# Patient Record
Sex: Female | Born: 1965 | Race: Black or African American | Hispanic: No | Marital: Married | Smoking: Former smoker
Health system: Southern US, Community
[De-identification: ages and names within clinical notes are randomized; demographics above are authoritative.]

## PROBLEM LIST (undated history)

## (undated) DIAGNOSIS — Z923 Personal history of irradiation: Secondary | ICD-10-CM

## (undated) DIAGNOSIS — C50919 Malignant neoplasm of unspecified site of unspecified female breast: Secondary | ICD-10-CM

## (undated) DIAGNOSIS — Z973 Presence of spectacles and contact lenses: Secondary | ICD-10-CM

## (undated) DIAGNOSIS — M79671 Pain in right foot: Secondary | ICD-10-CM

## (undated) DIAGNOSIS — K649 Unspecified hemorrhoids: Secondary | ICD-10-CM

## (undated) DIAGNOSIS — Z79810 Long term (current) use of selective estrogen receptor modulators (SERMs): Secondary | ICD-10-CM

## (undated) HISTORY — DX: Long term (current) use of selective estrogen receptor modulators (serms): Z79.810

## (undated) HISTORY — PX: OTHER SURGICAL HISTORY: SHX169

## (undated) HISTORY — DX: Unspecified hemorrhoids: K64.9

## (undated) HISTORY — PX: DILATION AND CURETTAGE OF UTERUS: SHX78

## (undated) HISTORY — PX: BREAST LUMPECTOMY: SHX2

---

## 1999-12-20 ENCOUNTER — Emergency Department (HOSPITAL_COMMUNITY): Admission: EM | Admit: 1999-12-20 | Discharge: 1999-12-20 | Payer: Self-pay | Admitting: Emergency Medicine

## 2000-06-20 ENCOUNTER — Other Ambulatory Visit: Admission: RE | Admit: 2000-06-20 | Discharge: 2000-06-20 | Payer: Self-pay | Admitting: *Deleted

## 2004-02-14 ENCOUNTER — Inpatient Hospital Stay (HOSPITAL_COMMUNITY): Admission: AD | Admit: 2004-02-14 | Discharge: 2004-02-14 | Payer: Self-pay | Admitting: Obstetrics

## 2004-03-26 ENCOUNTER — Ambulatory Visit (HOSPITAL_COMMUNITY): Admission: RE | Admit: 2004-03-26 | Discharge: 2004-03-26 | Payer: Self-pay | Admitting: Obstetrics & Gynecology

## 2004-03-29 ENCOUNTER — Ambulatory Visit (HOSPITAL_COMMUNITY): Admission: RE | Admit: 2004-03-29 | Discharge: 2004-03-29 | Payer: Self-pay | Admitting: Obstetrics

## 2004-03-29 ENCOUNTER — Encounter (INDEPENDENT_AMBULATORY_CARE_PROVIDER_SITE_OTHER): Payer: Self-pay | Admitting: Specialist

## 2006-01-06 ENCOUNTER — Encounter (INDEPENDENT_AMBULATORY_CARE_PROVIDER_SITE_OTHER): Payer: Self-pay | Admitting: Specialist

## 2006-01-06 ENCOUNTER — Inpatient Hospital Stay (HOSPITAL_COMMUNITY): Admission: AD | Admit: 2006-01-06 | Discharge: 2006-01-08 | Payer: Self-pay | Admitting: Obstetrics

## 2006-01-12 ENCOUNTER — Inpatient Hospital Stay (HOSPITAL_COMMUNITY): Admission: AD | Admit: 2006-01-12 | Discharge: 2006-01-14 | Payer: Self-pay | Admitting: Obstetrics

## 2006-07-26 ENCOUNTER — Encounter: Admission: RE | Admit: 2006-07-26 | Discharge: 2006-07-26 | Payer: Self-pay | Admitting: Family Medicine

## 2006-08-14 ENCOUNTER — Ambulatory Visit (HOSPITAL_COMMUNITY): Admission: RE | Admit: 2006-08-14 | Discharge: 2006-08-14 | Payer: Self-pay | Admitting: Obstetrics

## 2007-08-22 ENCOUNTER — Ambulatory Visit (HOSPITAL_COMMUNITY): Admission: RE | Admit: 2007-08-22 | Discharge: 2007-08-22 | Payer: Self-pay | Admitting: Obstetrics

## 2008-01-13 HISTORY — PX: FOOT FUSION: SHX956

## 2008-01-31 ENCOUNTER — Encounter: Admission: RE | Admit: 2008-01-31 | Discharge: 2008-01-31 | Payer: Self-pay | Admitting: Orthopedic Surgery

## 2008-02-14 ENCOUNTER — Ambulatory Visit (HOSPITAL_BASED_OUTPATIENT_CLINIC_OR_DEPARTMENT_OTHER): Admission: RE | Admit: 2008-02-14 | Discharge: 2008-02-14 | Payer: Self-pay | Admitting: Orthopedic Surgery

## 2008-08-26 ENCOUNTER — Ambulatory Visit (HOSPITAL_COMMUNITY): Admission: RE | Admit: 2008-08-26 | Discharge: 2008-08-26 | Payer: Self-pay | Admitting: Obstetrics

## 2008-11-12 ENCOUNTER — Encounter: Admission: RE | Admit: 2008-11-12 | Discharge: 2008-11-12 | Payer: Self-pay | Admitting: Family Medicine

## 2009-08-31 ENCOUNTER — Ambulatory Visit (HOSPITAL_COMMUNITY): Admission: RE | Admit: 2009-08-31 | Discharge: 2009-08-31 | Payer: Self-pay | Admitting: Obstetrics

## 2010-09-02 ENCOUNTER — Ambulatory Visit (HOSPITAL_COMMUNITY)
Admission: RE | Admit: 2010-09-02 | Discharge: 2010-09-02 | Payer: Self-pay | Source: Home / Self Care | Admitting: Obstetrics

## 2011-03-29 NOTE — Op Note (Signed)
NAMEMEYLI, BOICE               ACCOUNT NO.:  [id number]   MEDICAL RECORD NO.:  [id number]          PATIENT TYPE:  AMB   LOCATION:  [facility]                         FACILITY:  [facility]   PHYSICIAN:  Deidre Ala, M.D.    DATE OF BIRTH:  1966/07/24   DATE OF PROCEDURE:  02/14/2008  DATE OF DISCHARGE:                               OPERATIVE REPORT   PREOPERATIVE DIAGNOSIS:  Nonunion right medial malleolar fracture,  nondisplaced, closed at 12 weeks.   POSTOPERATIVE DIAGNOSIS:  Nonunion right medial malleolar fracture,  nondisplaced, closed at 12 weeks.   PROCEDURE:  Open reduction and internal fixation of nonunion medial  malleolar fracture with two 4.5 cannulated cancellous screws,  percutaneous.   SURGEON:  1. Charlesetta Shanks, M.D.   ASSISTANT:  Phineas Semen, P.A.-C.   ANESTHESIA:  General with LMA.   CULTURES:  None.   DRAINS:  None.   ESTIMATED BLOOD LOSS:  Minimal.   TOURNIQUET TIME:  Without.   PATHOLOGIC FINDINGS AND HISTORY:  Jaylon is a 45 year old who slipped on  some iced marbles steps and landed on the concrete injuring her ankle.  She was sent by Dr. Angelita Ingles. Blunt on November 23, 2007. Her injury was  November 18, 2007.  X-rays revealed a medial malleolar fracture classic  position with intact mortise nondisplaced.  Because of its position, we  felt it may well heal without surgical intervention and it was  nondisplaced so we put her in a Cam Walker boot and watched her very  closely.  She appeared to be healing and at about 8 weeks out, we took  her out of the boot into a lace up brace.  She came back in with some  pain.  X-rays were taken showing increase lucency at the fracture site  so a CT scan was ordered which showed, with 3D reconstruct, there was  some T-shaped comminution but primarily it did show capping of the  medullary cavity indicative of early nonunion.  We, therefore, elected  to take her to the operating room.  At surgery, we placed two guide pins  for  4.5 cannulated cancellous screws.  They were slightly divergent at  the medial malleolus and up.  C-arm fluoroscopy confirmed positioning on  both views.  We placed them parallel on the lateral and slight diversion  on the AP mortise view.  In effect, the drilling across the fracture  site with the guide pins will stimulate healing and this will buttress  and stabilize the fracture.   DESCRIPTION OF PROCEDURE:  With adequate anesthesia obtained using LMA  technique, 1 gram Ancef given as IV prophylaxis, the patient was placed  in the supine position.  The right lower extremity was prepped from the  toes to the upper leg in a standard fashion.  After standard prepping  and draping, we first placed the guide pins and then drilled with the  drill and then placed, after measurement, two 70 mm screws and tightened  them down.  C-arm fluoroscopy confirmed positioning.  The patient then,  having tolerated the procedure well, had these wounds closed with  4-0  nylon.  A bulky sterile compressive dressing was applied.  The patient  was placed back in her Cam Conservation officer, nature.  She was then awakened and taken  to the recovery room in satisfactory condition to be discharged per  outpatient routine, given Vicodin for pain, elevation, and told to call  the office for recheck on Saturday.           ______________________________  V. Charlesetta Shanks, M.D.     VEP/MEDQ  D:  02/14/2008  T:  02/14/2008  Job:  [id number]

## 2011-04-01 NOTE — Discharge Summary (Signed)
[name], Sue [name]                ACCOUNT NO.:  [id number]   MEDICAL RECORD NO.:  [id number]          PATIENT TYPE:  INP   LOCATION:  9308                          FACILITY:  [facility]   PHYSICIAN:  [name], M.D.DATE OF BIRTH:  1965-12-28   DATE OF ADMISSION:  01/12/2006  DATE OF DISCHARGE:  01/14/2006                                 DISCHARGE SUMMARY   The patient is a 45 year old gravida 2, para 1-0-1-1, who had a normal  vaginal delivery on January 06, 2006 and she was admitted with a count of  103.  No nausea or vomiting, no diarrhea and normal lochia.  On admission,  it was noted that her uterus was rather tender and 16 weeks' size.  She was  started on ampicillin and gentamicin.  On day after admission, temp was 101+  and by March 3, her highest temp was 100.3.  She felt much better.  Uterine  tenderness improved and she was discharged home on Cleocin 300 mg p.o. q.6h.   DISCHARGE DIAGNOSIS:  Status post postpartum endometritis.           ______________________________  [name], M.D.     BAM/MEDQ  D:  02/01/2006  T:  02/01/2006  Job:  [id number]

## 2011-04-01 NOTE — Op Note (Signed)
[name], Sue [name]                          ACCOUNT NO.:  [id number]   MEDICAL RECORD NO.:  [id number]                   PATIENT TYPE:  AMB   LOCATION:  [facility]                                  FACILITY:  [facility]   PHYSICIAN:  Charles A. Clearance Coots, M.D.             DATE OF BIRTH:  18-Oct-1966   DATE OF PROCEDURE:  03/29/2004  DATE OF DISCHARGE:                                 OPERATIVE REPORT   PREOPERATIVE DIAGNOSIS:  Eight week fetal demise by ultrasound.   POSTOPERATIVE DIAGNOSIS:  Eight week fetal demise by ultrasound.   PROCEDURE:  Suction, dilatation and evacuation.   SURGEON:  Charles A. Clearance Coots, M.D.   ANESTHESIA:  MAC with paracervical block.   ESTIMATED BLOOD LOSS:  100 ml.   COMPLICATIONS:  None.   SPECIMENS:  Products of conception.   OPERATION:  The patient was brought to the operating room and after  satisfactory IV sedation, the legs were brought up in stirrups and the  vagina was prepped and draped in the usual sterile fashion.  The urinary  bladder was emptied of approximately 100 ml of clear urine.  Bimanual  examination revealed the uterus to be approximately eight week size and mid  position.  The sterile speculum was inserted in the vaginal vault and the  anterior lip of the cervix was grasped with a single-toothed tenaculum.  The  paracervical block of approximately 20 ml of 2% Xylocaine with 1 ml of  sodium bicarbonate.  The mixture was injected in each lateral fornix,  approximately 10 ml in each lateral fornix.  The axis of the uterus was then  sounded.  The cervix was dilated to a #27 [place] dilator.  The #8  suction catheter was then easily introduced into the uterine cavity.  All  contents were evacuated.  The endometrial surface felt gritty at the  conclusion of the procedure all around and the uterus contracted down quite  well.  There was no active bleeding at the conclusion of the procedure.  All  instruments were retired.  The patient tolerated  the procedure well and  transported to the recovery room in satisfactory condition.                                               Charles A. Clearance Coots, M.D.    CAH/MEDQ  D:  03/29/2004  T:  03/29/2004  Job:  [id number]

## 2011-07-25 ENCOUNTER — Other Ambulatory Visit (HOSPITAL_COMMUNITY): Payer: Self-pay | Admitting: Obstetrics

## 2011-07-25 DIAGNOSIS — Z1231 Encounter for screening mammogram for malignant neoplasm of breast: Secondary | ICD-10-CM

## 2011-08-09 LAB — POCT PREGNANCY, URINE
Operator id: 114531
Preg Test, Ur: NEGATIVE

## 2011-08-09 LAB — POCT HEMOGLOBIN-HEMACUE
Hemoglobin: 14.4
Operator id: 114531

## 2011-09-06 ENCOUNTER — Ambulatory Visit (HOSPITAL_COMMUNITY): Payer: Medicaid Other | Attending: Obstetrics

## 2011-11-24 ENCOUNTER — Ambulatory Visit (HOSPITAL_COMMUNITY)
Admission: RE | Admit: 2011-11-24 | Discharge: 2011-11-24 | Disposition: A | Discharge status: Home / Self Care | Payer: Medicaid Other | Source: Ambulatory Visit | Attending: Obstetrics | Admitting: Obstetrics

## 2011-11-24 DIAGNOSIS — Z1231 Encounter for screening mammogram for malignant neoplasm of breast: Secondary | ICD-10-CM

## 2011-12-01 ENCOUNTER — Other Ambulatory Visit: Payer: Self-pay | Admitting: Obstetrics

## 2011-12-01 DIAGNOSIS — R928 Other abnormal and inconclusive findings on diagnostic imaging of breast: Secondary | ICD-10-CM

## 2011-12-26 ENCOUNTER — Ambulatory Visit
Admission: RE | Admit: 2011-12-26 | Discharge: 2011-12-26 | Disposition: A | Discharge status: Home / Self Care | Payer: Medicaid Other | Source: Ambulatory Visit | Attending: Obstetrics | Admitting: Obstetrics

## 2011-12-26 DIAGNOSIS — R928 Other abnormal and inconclusive findings on diagnostic imaging of breast: Secondary | ICD-10-CM

## 2012-05-23 ENCOUNTER — Other Ambulatory Visit: Payer: Self-pay | Admitting: Obstetrics

## 2012-05-23 DIAGNOSIS — R921 Mammographic calcification found on diagnostic imaging of breast: Secondary | ICD-10-CM

## 2012-06-25 ENCOUNTER — Ambulatory Visit
Admission: RE | Admit: 2012-06-25 | Discharge: 2012-06-25 | Disposition: A | Discharge status: Home / Self Care | Payer: Medicaid Other | Source: Ambulatory Visit | Attending: Obstetrics | Admitting: Obstetrics

## 2012-06-25 ENCOUNTER — Other Ambulatory Visit: Payer: Self-pay | Admitting: Obstetrics

## 2012-06-25 DIAGNOSIS — R921 Mammographic calcification found on diagnostic imaging of breast: Secondary | ICD-10-CM

## 2012-07-19 ENCOUNTER — Other Ambulatory Visit: Payer: Self-pay | Admitting: Obstetrics

## 2012-07-19 ENCOUNTER — Ambulatory Visit
Admission: RE | Admit: 2012-07-19 | Discharge: 2012-07-19 | Disposition: A | Discharge status: Home / Self Care | Payer: Medicaid Other | Source: Ambulatory Visit | Attending: Obstetrics | Admitting: Obstetrics

## 2012-07-19 DIAGNOSIS — R921 Mammographic calcification found on diagnostic imaging of breast: Secondary | ICD-10-CM

## 2012-07-26 ENCOUNTER — Ambulatory Visit
Admission: RE | Admit: 2012-07-26 | Discharge: 2012-07-26 | Disposition: A | Discharge status: Home / Self Care | Payer: Medicaid Other | Source: Ambulatory Visit | Attending: Obstetrics | Admitting: Obstetrics

## 2012-07-26 DIAGNOSIS — R921 Mammographic calcification found on diagnostic imaging of breast: Secondary | ICD-10-CM

## 2012-07-26 HISTORY — PX: OTHER SURGICAL HISTORY: SHX169

## 2012-07-27 ENCOUNTER — Other Ambulatory Visit: Payer: Self-pay | Admitting: Obstetrics

## 2012-07-27 DIAGNOSIS — N6489 Other specified disorders of breast: Secondary | ICD-10-CM

## 2012-07-27 DIAGNOSIS — C50912 Malignant neoplasm of unspecified site of left female breast: Secondary | ICD-10-CM

## 2012-07-31 ENCOUNTER — Ambulatory Visit
Admission: RE | Admit: 2012-07-31 | Discharge: 2012-07-31 | Disposition: A | Discharge status: Home / Self Care | Payer: Medicaid Other | Source: Ambulatory Visit | Attending: Obstetrics | Admitting: Obstetrics

## 2012-07-31 ENCOUNTER — Ambulatory Visit
Admission: RE | Admit: 2012-07-31 | Discharge: 2012-07-31 | Disposition: A | Discharge status: Home / Self Care | Payer: Self-pay | Source: Ambulatory Visit | Attending: Obstetrics | Admitting: Obstetrics

## 2012-07-31 DIAGNOSIS — N6489 Other specified disorders of breast: Secondary | ICD-10-CM

## 2012-07-31 DIAGNOSIS — C50912 Malignant neoplasm of unspecified site of left female breast: Secondary | ICD-10-CM

## 2012-07-31 MED ORDER — GADOBENATE DIMEGLUMINE 529 MG/ML IV SOLN
15.0000 mL | Freq: Once | INTRAVENOUS | Status: AC | PRN
  Administered 2012-07-31: 15 mL via INTRAVENOUS

## 2012-08-02 ENCOUNTER — Ambulatory Visit (INDEPENDENT_AMBULATORY_CARE_PROVIDER_SITE_OTHER): Payer: Medicaid Other | Admitting: General Surgery

## 2012-08-02 ENCOUNTER — Encounter (INDEPENDENT_AMBULATORY_CARE_PROVIDER_SITE_OTHER): Payer: Self-pay | Admitting: General Surgery

## 2012-08-02 ENCOUNTER — Other Ambulatory Visit (INDEPENDENT_AMBULATORY_CARE_PROVIDER_SITE_OTHER): Payer: Self-pay | Admitting: General Surgery

## 2012-08-02 VITALS — BP 104/62 | HR 88 | Temp 97.6°F | Resp 20 | Ht 69.0 in | Wt 154.2 lb

## 2012-08-02 DIAGNOSIS — C50419 Malignant neoplasm of upper-outer quadrant of unspecified female breast: Secondary | ICD-10-CM

## 2012-08-02 NOTE — Progress Notes (Signed)
Patient ID: Sue [name], female   DOB: 30-Sep-1966, 46 y.o.   MRN: [id number]  Chief Complaint  Patient presents with  . Pre-op Exam    eval lt br ca    HPI Sue [name] is a 46 y.o. female.  She is referred by Dr. Britta Mccreedy at the breast center of [facility] for a direct and management of noninvasive cancer of the left breast, upper outer quadrant.  The patient has not had her breast problem in the past. She gets routine breast exams and routine mammograms through her primary care physician, Dr. Francoise Ceo. Recent mammograms showed a 1.5 cm area of microcalcifications in the upper outer quadrant of the left breast. The right breast looks normal on mammogram. Biopsy of the left breast calcification should ductal carcinoma in situ, ER 100%, PR 0%. MRI shows biopsy changes but no other abnormalities and no adenopathy.  Family history reveals that her mother was diagnosed with breast cancer in her 51s. She has on her father's side, 2 cousins who are twins both of who were diagnosed with breast cancer and had been treated she does not know about any genetic testing that was done.  She is otherwise healthy. She does not smoke but does drink alcohol occasionally. 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 She's had 3 pregnancies, one delivery and  has lost 2 pregnancies. HPI  History reviewed. No pertinent past medical history.  Past Surgical History  Procedure Date  . Foot fusion     Family History  Problem Relation Age of Onset  . Cancer Mother     breast&blood  . Cancer Paternal Aunt     brain    Social History History  Substance Use Topics  . Smoking status: Never Smoker   . Smokeless tobacco: Never Used  . Alcohol Use: Yes     very seldom    No Known Allergies  No current outpatient prescriptions on file.    Review of Systems Review of Systems  Constitutional: Negative for fever, chills and unexpected weight change.    HENT: Negative for hearing loss, congestion, sore throat, trouble swallowing and voice change.   Eyes: Negative for visual disturbance.  Respiratory: Negative for cough and wheezing.   Cardiovascular: Negative for chest pain, palpitations and leg swelling.  Gastrointestinal: Negative for nausea, vomiting, abdominal pain, diarrhea, constipation, blood in stool, abdominal distention and anal bleeding.  Genitourinary: Negative for hematuria, vaginal bleeding and difficulty urinating.  Musculoskeletal: Negative for arthralgias.  Skin: Negative for rash and wound.  Neurological: Negative for seizures, syncope and headaches.  Hematological: Negative for adenopathy. Does not bruise/bleed easily.  Psychiatric/Behavioral: Negative for confusion.    Blood pressure 104/62, pulse 88, temperature 97.6 F (36.4 C), temperature source Temporal, resp. rate 20, height 5\' 9"  (1.753 m), weight 154 lb 3.2 oz (69.945 kg), last menstrual period 07/02/2012.  Physical Exam Physical Exam  Constitutional: She is oriented to person, place, and time. She appears well-developed and well-nourished. No distress.  HENT:  Head: Normocephalic and atraumatic.  Nose: Nose normal.  Mouth/Throat: No oropharyngeal exudate.  Eyes: Conjunctivae normal and EOM are normal. Pupils are equal, round, and reactive to light. Left eye exhibits no discharge. No scleral icterus.  Neck: Neck supple. No JVD present. No tracheal deviation present. No thyromegaly present.  Cardiovascular: Normal rate, regular rhythm, normal heart sounds and intact distal pulses.   No murmur heard. Pulmonary/Chest: Effort normal and breath  sounds normal. No respiratory distress. She has no wheezes. She has no rales. She exhibits no tenderness.       Breasts are medium sized, soft. Mild diffuse lumpiness, no palpable mass. Biopsy site well healed, no hematoma. No axillary adenopathy on either side. No skin change on either side.  Abdominal: Soft. Bowel  sounds are normal. She exhibits no distension and no mass. There is no tenderness. There is no rebound and no guarding.  Musculoskeletal: She exhibits no edema and no tenderness.  Lymphadenopathy:    She has no cervical adenopathy.  Neurological: She is alert and oriented to person, place, and time. She exhibits normal muscle tone. Coordination normal.  Skin: Skin is warm. No rash noted. She is not diaphoretic. No erythema. No pallor.  Psychiatric: She has a normal mood and affect. Her behavior is normal. Judgment and thought content normal.    Data Reviewed All imaging studies and pathology reports.  Assessment    Grade 2 DCIS left breast, upper outer quadrant, ER 100%, PR 0%.  This is a small focal area, she is a good candidate for breast conservation surgery    Plan    We had a long talk about management of breast cancer. We talked about the difference in noninvasive and invasive cancer. We talked about mastectomy, lumpectomy, sentinel node biopsy. We talked about adjuvant radiation therapy and add adjuvant antiestrogen therapy. We talked about genetic counseling and genetic testing.  At this time she would like breast conservation surgery. She is not interested in genetic counseling at this time but would like to do that postop. She is not interested in second opinion at this time but knows that she'll be referred to medical oncology and radiation oncology postop.  She will be scheduled for left partial mastectomy with needle localization. We will also do a sentinel node because this is in the upper outer quadrant and will destroy any future mapping possibilities for sentinel nodes.   I discussed the indications, details, techniques, and numerous risks of the surgery with her. She understands these issues. Her questions are answered. She agrees with this plan.       [name]. Derrell Lolling, M.D., [facility] General and Minimally invasive Surgery Breast and  Colorectal Surgery Office:   [phone] Pager:   [phone]  08/02/2012, 12:07 PM

## 2012-08-02 NOTE — Patient Instructions (Signed)
You have a very small,  localized area of non-invasive cancer in the left breast, upper outer quadrant.  We have discussed surgical options, second opinions, genetic testing, radiation therapy, and antiestrogen therapy.  We have decided to proceed with left partial mastectomy with needle localization and left axillary sentinel node biopsy. You will be scheduled for that surgery in the near future.  You'll be referred to a medical oncologist and a radiation oncologist and the genetic counselor postop.     Lumpectomy, Breast Conserving Surgery A lumpectomy is breast surgery that removes only part of the breast. Another name used may be partial mastectomy. The amount removed varies. Make sure you understand how much of your breast will be removed. Reasons for a lumpectomy:  Any solid breast mass.   Grouped significant nodularity that may be confused with a solitary breast mass.  Lumpectomy is the most common form of breast cancer surgery today. The surgeon removes the portion of your breast which contains the tumor (cancer). This is the lump. Some normal tissue around the lump is also removed to be sure that all the tumor has been removed.  If cancer cells are found in the margins where the breast tissue was removed, your surgeon will do more surgery to remove the remaining cancer tissue. This is called re-excision surgery. Radiation and/or chemotherapy treatments are often given following a lumpectomy to kill any cancer cells that could possibly remain.  REASONS YOU MAY NOT BE ABLE TO HAVE BREAST CONSERVING SURGERY:  The tumor is located in more than one place.   Your breast is small and the tumor is large so the breast would be disfigured.   The entire tumor removal is not successful with a lumpectomy.   You cannot commit to a full course of chemotherapy, radiation therapy or are pregnant and cannot have radiation.   You have previously had radiation to the breast to treat cancer.  HOW  A LUMPECTOMY IS PERFORMED If overnight nursing is not required following a biopsy, a lumpectomy can be performed as a same-day surgery. This can be done in a hospital, clinic, or surgical center. The anesthesia used will depend on your surgeon. They will discuss this with you. A general anesthetic keeps you sleeping through the procedure. LET YOUR CAREGIVERS KNOW ABOUT THE FOLLOWING:  Allergies   Medications taken including herbs, eye drops, over the counter medications, and creams.   Use of steroids (by mouth or creams)   Previous problems with anesthetics or Novocaine.   Possibility of pregnancy, if this applies   History of blood clots (thrombophlebitis)   History of bleeding or blood problems.   Previous surgery   Other health problems  BEFORE THE PROCEDURE You should be present one hour prior to your procedure unless directed otherwise.  AFTER THE PROCEDURE  After surgery, you will be taken to the recovery area where a nurse will watch and check your progress. Once you're awake, stable, and taking fluids well, barring other problems you will be allowed to go home.   Ice packs applied to your operative site may help with discomfort and keep the swelling down.   A small rubber drain may be placed in the breast for a couple of days to prevent a hematoma from developing in the breast.   A pressure dressing may be applied for 24 to 48 hours to prevent bleeding.   Keep the wound dry.   You may resume a normal diet and activities as directed. Avoid strenuous activities  affecting the arm on the side of the biopsy site such as tennis, swimming, heavy lifting (more than 10 pounds) or pulling.   Bruising in the breast is normal following this procedure.   Wearing a bra - even to bed - may be more comfortable and also help keep the dressing on.   Change dressings as directed.   Only take over-the-counter or prescription medicines for pain, discomfort, or fever as directed by your  caregiver.  Call for your results as instructed by your surgeon. Remember it is your responsibility to get the results of your lumpectomy if your surgeon asked you to follow-up. Do not assume everything is fine if you have not heard from your caregiver. SEEK MEDICAL CARE IF:   There is increased bleeding (more than a small spot) from the wound.   You notice redness, swelling, or increasing pain in the wound.   Pus is coming from wound.   An unexplained oral temperature above 102 F (38.9 C) develops.   You notice a foul smell coming from the wound or dressing.  SEEK IMMEDIATE MEDICAL CARE IF:   You develop a rash.   You have difficulty breathing.   You have any allergic problems.  Document Released: 12/12/2006 Document Revised: 10/20/2011 Document Reviewed: 03/15/2007 [facility] Patient Information 2012 [place], [place].

## 2012-08-22 ENCOUNTER — Encounter (HOSPITAL_BASED_OUTPATIENT_CLINIC_OR_DEPARTMENT_OTHER): Payer: Self-pay | Admitting: *Deleted

## 2012-08-22 NOTE — Progress Notes (Signed)
Order for cxr only-no labs per aneth needed

## 2012-08-23 ENCOUNTER — Ambulatory Visit
Admission: RE | Admit: 2012-08-23 | Discharge: 2012-08-23 | Disposition: A | Discharge status: Home / Self Care | Payer: Medicaid Other | Source: Ambulatory Visit | Attending: General Surgery | Admitting: General Surgery

## 2012-08-27 NOTE — H&P (Signed)
Sue [name]     MRN: [id number]   Description: 46 year old female  Provider: [name], MD  Department: [facility]        Diagnoses     Cancer of upper-outer quadrant of female breast   - Primary    174.4    DCIS left UOQ  ER100%; PR 0%      Cancer of upper-outer quadrant of female breast     174.4        Vitals -   BP Pulse Temp Resp Ht Wt    104/62 88 97.6 F (36.4 C) (Temporal) 20 5\' 9"  (1.753 m) 154 lb 3.2 oz (69.945 kg)     BMI - 22.77 kg/m2 07/02/2012               History and Physical     [name], MD  Patient ID: Sue [name], female   DOB: 1966-07-29, 46 y.o.   MRN: [id number]               HPI Sue [name] is a 46 y.o. female.  She is referred by Dr. Britta Mccreedy at the breast center of [facility] for a direct and management of noninvasive cancer of the left breast, upper outer quadrant.   The patient has not had her breast problem in the past. She gets routine breast exams and routine mammograms through her primary care physician, Dr. Francoise Ceo. Recent mammograms showed a 1.5 cm area of microcalcifications in the upper outer quadrant of the left breast. The right breast looks normal on mammogram. Biopsy of the left breast calcification should ductal carcinoma in situ, ER 100%, PR 0%. MRI shows biopsy changes but no other abnormalities and no adenopathy.   Family history reveals that her mother was diagnosed with breast cancer in her 63s. She has on her father's side, 2 cousins who are twins both of who were diagnosed with breast cancer and had been treated she does not know about any genetic testing that was done.   She is otherwise healthy. She does not smoke but does drink alcohol occasionally. 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 She's had 3 pregnancies, one delivery and  has lost 2 pregnancies.    History reviewed. No pertinent past medical history.    Past  Surgical History   Procedure  Date   .  Foot fusion         Family History   Problem  Relation  Age of Onset   .  Cancer  Mother         breast&blood   .  Cancer  Paternal Aunt         brain      Social History History   Substance Use Topics   .  Smoking status:  Never Smoker    .  Smokeless tobacco:  Never Used   .  Alcohol Use:  Yes         very seldom      No Known Allergies    No current outpatient prescriptions on file.      Review of Systems   Constitutional: Negative for fever, chills and unexpected weight change.  HENT: Negative for hearing loss, congestion, sore throat, trouble swallowing and voice change.   Eyes: Negative for visual disturbance.  Respiratory: Negative for cough and wheezing.   Cardiovascular: Negative for chest pain, palpitations and leg swelling.  Gastrointestinal: Negative for nausea, vomiting, abdominal pain, diarrhea, constipation, blood in stool, abdominal distention and anal bleeding.  Genitourinary: Negative for hematuria, vaginal bleeding and difficulty urinating.  Musculoskeletal: Negative for arthralgias.  Skin: Negative for rash and wound.  Neurological: Negative for seizures, syncope and headaches.  Hematological: Negative for adenopathy. Does not bruise/bleed easily.  Psychiatric/Behavioral: Negative for confusion.    Blood pressure 104/62, pulse 88, temperature 97.6 F (36.4 C), temperature source Temporal, resp. rate 20, height 5\' 9"  (1.753 m), weight 154 lb 3.2 oz (69.945 kg), last menstrual period 07/02/2012.   Physical Exam   Constitutional: She is oriented to person, place, and time. She appears well-developed and well-nourished. No distress.  HENT:   Head: Normocephalic and atraumatic.   Nose: Nose normal.   Mouth/Throat: No oropharyngeal exudate.  Eyes: Conjunctivae normal and EOM are normal. Pupils are equal, round, and reactive to light. Left eye exhibits no discharge. No scleral icterus.  Neck: Neck supple.  No JVD present. No tracheal deviation present. No thyromegaly present.  Cardiovascular: Normal rate, regular rhythm, normal heart sounds and intact distal pulses.    No murmur heard. Pulmonary/Chest: Effort normal and breath sounds normal. No respiratory distress. She has no wheezes. She has no rales. She exhibits no tenderness.       Breasts are medium sized, soft. Mild diffuse lumpiness, no palpable mass. Biopsy site well healed, no hematoma. No axillary adenopathy on either side. No skin change on either side.  Abdominal: Soft. Bowel sounds are normal. She exhibits no distension and no mass. There is no tenderness. There is no rebound and no guarding.  Musculoskeletal: She exhibits no edema and no tenderness.  Lymphadenopathy:    She has no cervical adenopathy.  Neurological: She is alert and oriented to person, place, and time. She exhibits normal muscle tone. Coordination normal.  Skin: Skin is warm. No rash noted. She is not diaphoretic. No erythema. No pallor.  Psychiatric: She has a normal mood and affect. Her behavior is normal. Judgment and thought content normal.    Data Reviewed All imaging studies and pathology reports.   Assessment Grade 2 DCIS left breast, upper outer quadrant, ER 100%, PR 0%.   This is a small focal area, she is a good candidate for breast conservation surgery   Plan We had a long talk about management of breast cancer. We talked about the difference in noninvasive and invasive cancer. We talked about mastectomy, lumpectomy, sentinel node biopsy. We talked about adjuvant radiation therapy and add adjuvant antiestrogen therapy. We talked about genetic counseling and genetic testing.   At this time she would like breast conservation surgery. She is not interested in genetic counseling at this time but would like to do that postop. She is not interested in second opinion at this time but knows that she'll be referred to medical oncology and radiation  oncology postop.   She will be scheduled for left partial mastectomy with needle localization. We will also do a sentinel node because this is in the upper outer quadrant and will destroy any future mapping possibilities for sentinel nodes.    I discussed the indications, details, techniques, and numerous risks of the surgery with her. She understands these issues. Her questions are answered. She agrees with this plan.       [name]. Derrell Lolling, M.D., [facility] General and Minimally invasive Surgery Breast and Colorectal Surgery Office:   [phone] Pager:   [phone]

## 2012-08-28 ENCOUNTER — Encounter (HOSPITAL_BASED_OUTPATIENT_CLINIC_OR_DEPARTMENT_OTHER): Payer: Self-pay | Admitting: Anesthesiology

## 2012-08-28 ENCOUNTER — Encounter (HOSPITAL_COMMUNITY)
Admission: RE | Admit: 2012-08-28 | Discharge: 2012-08-28 | Disposition: A | Discharge status: Home / Self Care | Payer: Medicaid Other | Source: Ambulatory Visit | Attending: General Surgery | Admitting: General Surgery

## 2012-08-28 ENCOUNTER — Ambulatory Visit (HOSPITAL_BASED_OUTPATIENT_CLINIC_OR_DEPARTMENT_OTHER): Payer: Medicaid Other | Admitting: Anesthesiology

## 2012-08-28 ENCOUNTER — Encounter (HOSPITAL_BASED_OUTPATIENT_CLINIC_OR_DEPARTMENT_OTHER): Payer: Self-pay | Admitting: *Deleted

## 2012-08-28 ENCOUNTER — Ambulatory Visit
Admission: RE | Admit: 2012-08-28 | Discharge: 2012-08-28 | Disposition: A | Discharge status: Home / Self Care | Payer: Medicaid Other | Source: Ambulatory Visit | Attending: General Surgery | Admitting: General Surgery

## 2012-08-28 ENCOUNTER — Encounter (HOSPITAL_BASED_OUTPATIENT_CLINIC_OR_DEPARTMENT_OTHER)
Admission: RE | Disposition: A | Discharge status: Home / Self Care | Payer: Self-pay | Source: Ambulatory Visit | Attending: General Surgery

## 2012-08-28 ENCOUNTER — Ambulatory Visit (HOSPITAL_BASED_OUTPATIENT_CLINIC_OR_DEPARTMENT_OTHER)
Admission: RE | Admit: 2012-08-28 | Discharge: 2012-08-28 | Disposition: A | Discharge status: Home / Self Care | Payer: Medicaid Other | Source: Ambulatory Visit | Attending: General Surgery | Admitting: General Surgery

## 2012-08-28 ENCOUNTER — Other Ambulatory Visit (HOSPITAL_COMMUNITY): Payer: Self-pay

## 2012-08-28 DIAGNOSIS — D0590 Unspecified type of carcinoma in situ of unspecified breast: Secondary | ICD-10-CM | POA: Insufficient documentation

## 2012-08-28 DIAGNOSIS — C773 Secondary and unspecified malignant neoplasm of axilla and upper limb lymph nodes: Secondary | ICD-10-CM | POA: Insufficient documentation

## 2012-08-28 DIAGNOSIS — C50419 Malignant neoplasm of upper-outer quadrant of unspecified female breast: Secondary | ICD-10-CM

## 2012-08-28 DIAGNOSIS — C50919 Malignant neoplasm of unspecified site of unspecified female breast: Secondary | ICD-10-CM

## 2012-08-28 HISTORY — DX: Presence of spectacles and contact lenses: Z97.3

## 2012-08-28 HISTORY — PX: OTHER SURGICAL HISTORY: SHX169

## 2012-08-28 HISTORY — DX: Malignant neoplasm of unspecified site of unspecified female breast: C50.919

## 2012-08-28 LAB — POCT HEMOGLOBIN-HEMACUE: Hemoglobin: 14.8 g/dL (ref 12.0–15.0)

## 2012-08-28 SURGERY — PARTIAL MASTECTOMY WITH NEEDLE LOCALIZATION AND AXILLARY SENTINEL LYMPH NODE BX
Anesthesia: General | Site: Breast | Laterality: Left | Wound class: Clean

## 2012-08-28 MED ORDER — EPHEDRINE SULFATE 50 MG/ML IJ SOLN
INTRAMUSCULAR | Status: DC | PRN
  Administered 2012-08-28: 10 mg via INTRAVENOUS

## 2012-08-28 MED ORDER — ONDANSETRON HCL 4 MG/2ML IJ SOLN: 4.0000 mg | Freq: Once | INTRAMUSCULAR | Status: DC | PRN

## 2012-08-28 MED ORDER — PROPOFOL 10 MG/ML IV BOLUS
INTRAVENOUS | Status: DC | PRN
  Administered 2012-08-28: 50 mg via INTRAVENOUS
  Administered 2012-08-28: 200 mg via INTRAVENOUS
  Administered 2012-08-28: 50 mg via INTRAVENOUS

## 2012-08-28 MED ORDER — FENTANYL CITRATE 0.05 MG/ML IJ SOLN
50.0000 ug | INTRAMUSCULAR | Status: DC | PRN
  Administered 2012-08-28: 50 ug via INTRAVENOUS

## 2012-08-28 MED ORDER — CEFAZOLIN SODIUM-DEXTROSE 2-3 GM-% IV SOLR
2.0000 g | INTRAVENOUS | Status: AC
  Administered 2012-08-28: 2 g via INTRAVENOUS

## 2012-08-28 MED ORDER — SODIUM CHLORIDE 0.9 % IJ SOLN
INTRAMUSCULAR | Status: DC | PRN
  Administered 2012-08-28: 13:00:00 via INTRAMUSCULAR

## 2012-08-28 MED ORDER — LACTATED RINGERS IV SOLN
INTRAVENOUS | Status: DC
  Administered 2012-08-28 (×2): via INTRAVENOUS

## 2012-08-28 MED ORDER — SODIUM CHLORIDE 0.9 % IV SOLN: INTRAVENOUS | Status: DC

## 2012-08-28 MED ORDER — ACETAMINOPHEN 325 MG PO TABS: 650.0000 mg | ORAL_TABLET | ORAL | Status: DC | PRN

## 2012-08-28 MED ORDER — TECHNETIUM TC 99M SULFUR COLLOID FILTERED
1.0000 | Freq: Once | INTRAVENOUS | Status: AC | PRN
  Administered 2012-08-28: 1 via INTRADERMAL

## 2012-08-28 MED ORDER — HYDROCODONE-ACETAMINOPHEN 5-325 MG PO TABS: 1.0000 | ORAL_TABLET | ORAL | Status: DC | PRN

## 2012-08-28 MED ORDER — OXYCODONE HCL 5 MG PO TABS
5.0000 mg | ORAL_TABLET | Freq: Once | ORAL | Status: AC | PRN
  Administered 2012-08-28: 5 mg via ORAL

## 2012-08-28 MED ORDER — CHLORHEXIDINE GLUCONATE 4 % EX LIQD: 1.0000 "application " | Freq: Once | CUTANEOUS | Status: DC

## 2012-08-28 MED ORDER — BUPIVACAINE-EPINEPHRINE 0.5% -1:200000 IJ SOLN
INTRAMUSCULAR | Status: DC | PRN
  Administered 2012-08-28: 18 mL

## 2012-08-28 MED ORDER — FENTANYL CITRATE 0.05 MG/ML IJ SOLN
INTRAMUSCULAR | Status: DC | PRN
  Administered 2012-08-28 (×3): 50 ug via INTRAVENOUS

## 2012-08-28 MED ORDER — SODIUM CHLORIDE 0.9 % IV SOLN: 250.0000 mL | INTRAVENOUS | Status: DC | PRN

## 2012-08-28 MED ORDER — ONDANSETRON HCL 4 MG/2ML IJ SOLN
INTRAMUSCULAR | Status: DC | PRN
  Administered 2012-08-28: 4 mg via INTRAVENOUS

## 2012-08-28 MED ORDER — DEXAMETHASONE SODIUM PHOSPHATE 4 MG/ML IJ SOLN
INTRAMUSCULAR | Status: DC | PRN
  Administered 2012-08-28: 10 mg via INTRAVENOUS

## 2012-08-28 MED ORDER — MIDAZOLAM HCL 2 MG/2ML IJ SOLN
1.0000 mg | INTRAMUSCULAR | Status: DC | PRN
  Administered 2012-08-28: 1 mg via INTRAVENOUS

## 2012-08-28 MED ORDER — MORPHINE SULFATE 2 MG/ML IJ SOLN: 2.0000 mg | INTRAMUSCULAR | Status: DC | PRN

## 2012-08-28 MED ORDER — HYDROMORPHONE HCL PF 1 MG/ML IJ SOLN
0.2500 mg | INTRAMUSCULAR | Status: DC | PRN
  Administered 2012-08-28 (×2): 0.5 mg via INTRAVENOUS

## 2012-08-28 MED ORDER — ACETAMINOPHEN 650 MG RE SUPP: 650.0000 mg | RECTAL | Status: DC | PRN

## 2012-08-28 MED ORDER — OXYCODONE HCL 5 MG/5ML PO SOLN: 5.0000 mg | Freq: Once | ORAL | Status: AC | PRN

## 2012-08-28 MED ORDER — ONDANSETRON HCL 4 MG/2ML IJ SOLN: 4.0000 mg | Freq: Four times a day (QID) | INTRAMUSCULAR | Status: DC | PRN

## 2012-08-28 MED ORDER — SODIUM CHLORIDE 0.9 % IJ SOLN: 3.0000 mL | Freq: Two times a day (BID) | INTRAMUSCULAR | Status: DC

## 2012-08-28 MED ORDER — 0.9 % SODIUM CHLORIDE (POUR BTL) OPTIME
TOPICAL | Status: DC | PRN
  Administered 2012-08-28: 300 mL

## 2012-08-28 MED ORDER — SODIUM CHLORIDE 0.9 % IJ SOLN: 3.0000 mL | INTRAMUSCULAR | Status: DC | PRN

## 2012-08-28 MED ORDER — OXYCODONE HCL 5 MG PO TABS: 5.0000 mg | ORAL_TABLET | ORAL | Status: DC | PRN

## 2012-08-28 MED ORDER — LIDOCAINE HCL (CARDIAC) 20 MG/ML IV SOLN
INTRAVENOUS | Status: DC | PRN
  Administered 2012-08-28: 80 mg via INTRAVENOUS

## 2012-08-28 SURGICAL SUPPLY — 67 items
ADH SKN CLS APL DERMABOND .7 (GAUZE/BANDAGES/DRESSINGS) ×1
APL SKNCLS STERI-STRIP NONHPOA (GAUZE/BANDAGES/DRESSINGS)
APPLIER CLIP 11 MED OPEN (CLIP) ×2
APR CLP MED 11 20 MLT OPN (CLIP) ×1
BANDAGE ELASTIC 6 VELCRO ST LF (GAUZE/BANDAGES/DRESSINGS) IMPLANT
BENZOIN TINCTURE PRP APPL 2/3 (GAUZE/BANDAGES/DRESSINGS) IMPLANT
BINDER BREAST LRG (GAUZE/BANDAGES/DRESSINGS) ×1 IMPLANT
BLADE HEX COATED 2.75 (ELECTRODE) ×2 IMPLANT
BLADE SURG 10 STRL SS (BLADE) ×1 IMPLANT
BLADE SURG 15 STRL LF DISP TIS (BLADE) ×2 IMPLANT
BLADE SURG 15 STRL SS (BLADE)
CANISTER SUCTION 1200CC (MISCELLANEOUS) ×2 IMPLANT
CHLORAPREP W/TINT 26ML (MISCELLANEOUS) ×2 IMPLANT
CLIP APPLIE 11 MED OPEN (CLIP) ×1 IMPLANT
CLOTH BEACON ORANGE TIMEOUT ST (SAFETY) ×2 IMPLANT
COVER MAYO STAND STRL (DRAPES) ×2 IMPLANT
COVER PROBE W GEL 5X96 (DRAPES) ×2 IMPLANT
COVER TABLE BACK 60X90 (DRAPES) ×2 IMPLANT
DECANTER SPIKE VIAL GLASS SM (MISCELLANEOUS) ×1 IMPLANT
DERMABOND ADVANCED (GAUZE/BANDAGES/DRESSINGS) ×1
DERMABOND ADVANCED .7 DNX12 (GAUZE/BANDAGES/DRESSINGS) IMPLANT
DEVICE DUBIN W/COMP PLATE 8390 (MISCELLANEOUS) ×2 IMPLANT
DRAIN CHANNEL 19F RND (DRAIN) IMPLANT
DRAIN HEMOVAC 1/8 X 5 (WOUND CARE) IMPLANT
DRAPE LAPAROSCOPIC ABDOMINAL (DRAPES) ×2 IMPLANT
DRAPE UTILITY XL STRL (DRAPES) ×2 IMPLANT
DRSG PAD ABDOMINAL 8X10 ST (GAUZE/BANDAGES/DRESSINGS) IMPLANT
ELECT REM PT RETURN 9FT ADLT (ELECTROSURGICAL) ×2
ELECTRODE REM PT RTRN 9FT ADLT (ELECTROSURGICAL) ×1 IMPLANT
EVACUATOR SILICONE 100CC (DRAIN) IMPLANT
GAUZE SPONGE 4X4 12PLY STRL LF (GAUZE/BANDAGES/DRESSINGS) IMPLANT
GAUZE SPONGE 4X4 16PLY XRAY LF (GAUZE/BANDAGES/DRESSINGS) IMPLANT
GLOVE BIO SURGEON STRL SZ7 (GLOVE) ×1 IMPLANT
GLOVE BIOGEL PI IND STRL 7.0 (GLOVE) IMPLANT
GLOVE BIOGEL PI INDICATOR 7.0 (GLOVE) ×1
GLOVE EUDERMIC 7 POWDERFREE (GLOVE) ×2 IMPLANT
GOWN PREVENTION PLUS XLARGE (GOWN DISPOSABLE) ×2 IMPLANT
GOWN PREVENTION PLUS XXLARGE (GOWN DISPOSABLE) ×2 IMPLANT
KIT MARKER MARGIN INK (KITS) ×2 IMPLANT
NDL HYPO 25X1 1.5 SAFETY (NEEDLE) ×2 IMPLANT
NDL SAFETY ECLIPSE 18X1.5 (NEEDLE) ×1 IMPLANT
NEEDLE HYPO 18GX1.5 SHARP (NEEDLE) ×2
NEEDLE HYPO 25X1 1.5 SAFETY (NEEDLE) ×4 IMPLANT
NS IRRIG 1000ML POUR BTL (IV SOLUTION) ×2 IMPLANT
PACK BASIN DAY SURGERY FS (CUSTOM PROCEDURE TRAY) ×2 IMPLANT
PAD ALCOHOL SWAB (MISCELLANEOUS) ×3 IMPLANT
PENCIL BUTTON HOLSTER BLD 10FT (ELECTRODE) ×2 IMPLANT
PIN SAFETY STERILE (MISCELLANEOUS) IMPLANT
SLEEVE SCD COMPRESS KNEE MED (MISCELLANEOUS) ×1 IMPLANT
SPONGE GAUZE 4X4 12PLY (GAUZE/BANDAGES/DRESSINGS) ×2 IMPLANT
SPONGE LAP 18X18 X RAY DECT (DISPOSABLE) IMPLANT
SPONGE LAP 4X18 X RAY DECT (DISPOSABLE) ×2 IMPLANT
STRIP CLOSURE SKIN 1/2X4 (GAUZE/BANDAGES/DRESSINGS) IMPLANT
SUT ETHILON 3 0 FSL (SUTURE) IMPLANT
SUT MNCRL AB 4-0 PS2 18 (SUTURE) ×4 IMPLANT
SUT SILK 2 0 SH (SUTURE) ×2 IMPLANT
SUT VIC AB 2-0 CT1 27 (SUTURE)
SUT VIC AB 2-0 CT1 TAPERPNT 27 (SUTURE) IMPLANT
SUT VIC AB 3-0 SH 27 (SUTURE)
SUT VIC AB 3-0 SH 27X BRD (SUTURE) IMPLANT
SUT VICRYL 3-0 CR8 SH (SUTURE) ×3 IMPLANT
SYR CONTROL 10ML LL (SYRINGE) ×4 IMPLANT
TOWEL OR 17X24 6PK STRL BLUE (TOWEL DISPOSABLE) ×3 IMPLANT
TOWEL OR NON WOVEN STRL DISP B (DISPOSABLE) ×1 IMPLANT
TUBE CONNECTING 20X1/4 (TUBING) ×2 IMPLANT
WATER STERILE IRR 1000ML POUR (IV SOLUTION) ×1 IMPLANT
YANKAUER SUCT BULB TIP NO VENT (SUCTIONS) ×2 IMPLANT

## 2012-08-28 NOTE — Anesthesia Postprocedure Evaluation (Signed)
Anesthesia Post Note  Patient: Sue [name]  Procedure(s) Performed: Procedure(s) (LRB): PARTIAL MASTECTOMY WITH NEEDLE LOCALIZATION AND AXILLARY SENTINEL LYMPH NODE BX (Left)  Anesthesia type: General  Patient location: PACU  Post pain: Pain level controlled and Adequate analgesia  Post assessment: Post-op Vital signs reviewed, Patient's Cardiovascular Status Stable, Respiratory Function Stable, Patent Airway and Pain level controlled  Last Vitals:  Filed Vitals:   08/28/12 1530  BP: 150/71  Pulse: 68  Temp: 36.6 C  Resp: 18    Post vital signs: Reviewed and stable  Level of consciousness: awake, alert  and oriented  Complications: No apparent anesthesia complications

## 2012-08-28 NOTE — Transfer of Care (Signed)
Immediate Anesthesia Transfer of Care Note  Patient: Sue [name]  Procedure(s) Performed: Procedure(s) (LRB) with comments: PARTIAL MASTECTOMY WITH NEEDLE LOCALIZATION AND AXILLARY SENTINEL LYMPH NODE BX (Left) - Left partial mastectomy with needle localization left sentinel node biopsy  Patient Location: PACU  Anesthesia Type: General  Level of Consciousness: awake, alert  and oriented  Airway & Oxygen Therapy: Patient Spontanous Breathing and Patient connected to face mask oxygen  Post-op Assessment: Report given to PACU RN and Post -op Vital signs reviewed and stable  Post vital signs: Reviewed and stable  Complications: No apparent anesthesia complications

## 2012-08-28 NOTE — Anesthesia Preprocedure Evaluation (Signed)
Anesthesia Evaluation  Patient identified by MRN, date of birth, ID band Patient awake    Reviewed: Allergy & Precautions, H&P , NPO status , Patient's Chart, lab work & pertinent test results  Airway Mallampati: I TM Distance: >3 FB Neck ROM: Full    Dental  (+) Teeth Intact and Dental Advisory Given   Pulmonary  breath sounds clear to auscultation        Cardiovascular Rhythm:Regular Rate:Normal     Neuro/Psych    GI/Hepatic   Endo/Other    Renal/GU      Musculoskeletal   Abdominal   Peds  Hematology   Anesthesia Other Findings   Reproductive/Obstetrics                           Anesthesia Physical Anesthesia Plan  ASA: I  Anesthesia Plan: General   Post-op Pain Management:    Induction: Intravenous  Airway Management Planned: LMA  Additional Equipment:   Intra-op Plan:   Post-operative Plan:   Informed Consent: I have reviewed the patients History and Physical, chart, labs and discussed the procedure including the risks, benefits and alternatives for the proposed anesthesia with the patient or authorized representative who has indicated his/her understanding and acceptance.   Dental advisory given  Plan Discussed with: CRNA, Anesthesiologist and Surgeon  Anesthesia Plan Comments:         Anesthesia Quick Evaluation  

## 2012-08-28 NOTE — Anesthesia Procedure Notes (Signed)
Procedure Name: LMA Insertion Date/Time: 08/28/2012 1:07 PM Performed by: Burna Cash Pre-anesthesia Checklist: Patient identified, Emergency Drugs available, Suction available and Patient being monitored Patient Re-evaluated:Patient Re-evaluated prior to inductionOxygen Delivery Method: Circle System Utilized Preoxygenation: Pre-oxygenation with 100% oxygen Intubation Type: IV induction Ventilation: Mask ventilation without difficulty LMA: LMA with gastric port inserted LMA Size: 4.0 Number of attempts: 2 Placement Confirmation: positive ETCO2 Tube secured with: Tape Dental Injury: Teeth and Oropharynx as per pre-operative assessment  Comments: Unable to seat #4 LMA, #4 LMA supreme placed without difficulty

## 2012-08-28 NOTE — Op Note (Signed)
Patient Name:           Sue [name]   Date of Surgery:        08/28/2012  Pre op Diagnosis:      Ductal carcinoma in situ left breast, upper outer quadrant, ER positive, grade 2, Clinical stage Tis, N0   Post op Diagnosis:    Same  Procedure:                 Inject blue dye left breast, left partial mastectomy with needle localization, left axillary sentinel lymph node mapping and biopsy  Surgeon:                     [name]. Derrell Lolling, M.D., FACS  Assistant:                      None  Operative Indications:   Sue [name] is a 46 y.o. female. She is referred by Dr. Britta Mccreedy at the breast center of [facility] for  management of noninvasive cancer of the left breast, upper outer quadrant.  The patient has not had a breast problem in the past. She gets routine breast exams and routine mammograms through her primary care physician, Dr. Francoise Ceo. Recent mammograms showed a 1.5 cm area of microcalcifications in the upper outer quadrant of the left breast. The right breast looks normal on mammogram. Biopsy of the left breast calcification showed ductal carcinoma in situ, ER 100%, PR 0%. MRI shows biopsy changes but no other abnormalities and no adenopathy.  Family history reveals that her mother was diagnosed with breast cancer in her 25s. She has on her father's side, 2 cousins who are twins both of who were diagnosed with breast cancer and had been treated she does not know about any genetic testing that was done.  She is otherwise healthy. She does not smoke but does drink alcohol occasionally. 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 She's had 3 pregnancies, one delivery and has lost 2 pregnancies. The patient was counseled in the office. She desires breast conservation. She was offered genic counseling and states that she will pursue that postop. She is advised she will be referred to radiation oncology and medical oncology  postop.   Operative Findings:       The localizing wire entered the left breast in the upper-outer quadrant superiorly it was directed inferiorly. The specimen mammogram showed that the area in question and the wire were completely contained within the specimen. The lumpectomy dissection posteriorly was taken all the way down to the chest wall. I found one large and one small sentinel node.  Procedure in Detail:          The patient underwent wire localization by Dr. Deboraha Sprang at the breast center [place], and the wire was well placed. The patient underwent injection of radionuclide in the holding area at [facility]  surgery center. She was taken to the operating room and general anesthesia was induced. Surgical time out was performed. Following alcohol prep I injected 5 cc of blue dye in the left breast, subareolar area. This was 2 cc of methylene blue mixed with 3 cc of saline. The breast was massaged for 5 minutes. Intravenous antibiotics were given.  The patient's left breast and axilla were prepped and draped in a sterile fashion. 0.5% Marcaine with epinephrine was used as local infiltration anesthetic. A curvilinear incision was  made in the upper outer quadrant of the left breast. Dissection was carried down into the breast tissue. The wire was brought into the wound and dissection was carried around the wire all the way down to the pectoralis fascia. Specimen was removed and marked with the 6 color ink kit.Marland Kitchen Specimen mammogram looked good and the specimen was sent to the lab. The specimen mammogram was discussed with Dr. Deboraha Sprang. Hemostasis was excellent and achieved electrocautery. Was irrigated with saline. Metal clips were placed in the cardinal positions of the lumpectomy cavity. The breast tissue was re-approximated with multiple sutures of 3-0 Vicryl and the skin closed with a running subcuticular suture of 4-0 Monocryl and Dermabond.  Using the neoprobe I identified a pinpoint area in the left  axilla where there was radioactivity. A transverse incision was made.   dissection was carried down through the subcutaneous tissue and through the clavipectoral fascia. Using the neoprobe I found one enlarged very blue very hot sentinel node and one smaller very hot but not blue sentinel node. There was no other radioactivity and so only 2 sentinel nodes were sent. Hemostasis was excellent and achieved with  electrocautery. The clavipectoral fascia was closed with interrupted sutures of 3-0 Vicryl and the skin closed with a running subcuticular suture of 4-0 Monocryl and Dermabond. Once the Dermabond dry we put ABD pads and a breast binder in place. The patient tolerated the procedure well. There were no complications. She was taken to recovery room stable. EBL 15 CCP counts correct.     [name]. Derrell Lolling, M.D., FACS General and Minimally Invasive Surgery Breast and Colorectal Surgery  08/28/2012 2:14 PM

## 2012-08-28 NOTE — Interval H&P Note (Signed)
History and Physical Interval Note:  08/28/2012 12:44 PM  Sue [name]  has presented today for surgery, with the diagnosis of cancer of upper outer quadrant of female breast  The goals and the various methods of treatment have been discussed with the patient and family. After consideration of risks, benefits and other options for treatment, the patient has consented to  Procedure(s) (LRB) with comments: PARTIAL MASTECTOMY WITH NEEDLE LOCALIZATION AND AXILLARY SENTINEL LYMPH NODE BX (Left) - left partial mastectomy with needle localization left sentinel node biopsy as a surgical intervention .  The patient's history has been reviewed, patient examined today, no change in status, stable for surgery.  I have reviewed the patient's chart and labs.  Questions were answered to the patient's satisfaction.     [name]

## 2012-08-28 NOTE — Progress Notes (Signed)
Emotional support during breast injections °

## 2012-08-30 ENCOUNTER — Telehealth (INDEPENDENT_AMBULATORY_CARE_PROVIDER_SITE_OTHER): Payer: Self-pay | Admitting: General Surgery

## 2012-08-30 NOTE — Telephone Encounter (Signed)
I called her home number today to discuss her pathology with her. I left a message on her voicemail. I asked her to call me to discuss her pathology report.   Sue [name]. Derrell Lolling, M.D., [facility] General and Minimally invasive Surgery Breast and Colorectal Surgery Office:   [phone] Pager:   [phone]

## 2012-08-31 ENCOUNTER — Telehealth (INDEPENDENT_AMBULATORY_CARE_PROVIDER_SITE_OTHER): Payer: Self-pay

## 2012-08-31 ENCOUNTER — Telehealth (INDEPENDENT_AMBULATORY_CARE_PROVIDER_SITE_OTHER): Payer: Self-pay | Admitting: General Surgery

## 2012-08-31 NOTE — Telephone Encounter (Signed)
I called the patient again today and left message on her voicemail to discuss pathology reports. This is the second attempt.  Sue [name]. Derrell Lolling, M.D., [facility] General and Minimally invasive Surgery Breast and Colorectal Surgery Office:   [phone] Pager:   [phone]

## 2012-08-31 NOTE — Telephone Encounter (Signed)
Patient called back regarding her path results. Please call her at number [phone].

## 2012-09-03 ENCOUNTER — Telehealth (INDEPENDENT_AMBULATORY_CARE_PROVIDER_SITE_OTHER): Payer: Self-pay | Admitting: General Surgery

## 2012-09-03 NOTE — Telephone Encounter (Signed)
LMOM for patient to call regarding pathology report. Third attempt.   [name]. Derrell Lolling, M.D., [facility] General and Minimally invasive Surgery Breast and Colorectal Surgery Office:   [phone] Pager:   [phone]

## 2012-09-03 NOTE — Telephone Encounter (Signed)
Called patient per Dr. Jacinto Halim request. Advised her the margins were close and that he wanted to see her this week to discuss additional surgery. Patient set up to see Dr. Derrell Lolling on 09/06/12 at 8:00 (gave 30 minutes to discuss additional surgery).

## 2012-09-04 ENCOUNTER — Telehealth (INDEPENDENT_AMBULATORY_CARE_PROVIDER_SITE_OTHER): Payer: Self-pay | Admitting: General Surgery

## 2012-09-04 NOTE — Telephone Encounter (Signed)
Called to speak to patient about moving appointment from 8:00 to 12:15. The phone being used was breaking up, called another a phone and left message to advise co needed change.Before I finished typing this message the patient called back and was advised of change. Had to repeat again to her why Dr. Derrell Lolling wanted to see her.

## 2012-09-06 ENCOUNTER — Encounter (INDEPENDENT_AMBULATORY_CARE_PROVIDER_SITE_OTHER): Payer: Self-pay | Admitting: General Surgery

## 2012-09-06 ENCOUNTER — Ambulatory Visit (INDEPENDENT_AMBULATORY_CARE_PROVIDER_SITE_OTHER): Payer: Medicaid Other | Admitting: General Surgery

## 2012-09-06 VITALS — BP 138/84 | HR 76 | Temp 97.8°F | Resp 16 | Ht 69.0 in | Wt 150.0 lb

## 2012-09-06 DIAGNOSIS — C50419 Malignant neoplasm of upper-outer quadrant of unspecified female breast: Secondary | ICD-10-CM

## 2012-09-06 NOTE — Progress Notes (Signed)
Patient ID: Sue [name], female   DOB: 11/06/1966, 46 y.o.   MRN: [id number]  Chief Complaint  Patient presents with  . Routine Post Op  . Results    HPI Sue [name] is a 46 y.o. female.  She returns for further discussion of her left breast cancer.  This 46 year old African American female was diagnosed with ductal carcinoma in situ, receptor positive on recent image guided biopsy. This was a 1.5 cm area of microcalcifications in the upper outer quadrant of the left breast. MRI showed only biopsy changes.  Family history her reveals breast cancer in the mother, 2 paternal cousins with breast cancer. She was not if she does testing preop.  All on 08/28/2012 she underwent left partial mastectomy with needle localization and sentinel node biopsy. Pathology report showed microinvasive ductal carcinoma and ductal carcinoma in situ. The DCIS was totally less than 1 mm to the inferior margin, lateral margin, and medial margin. One sentinel lymph node showed isolated tumor cells and one sentinel lymph node was negative. Pathologic stage Tmic, ., N0(i+)  We called her and told her about her close margins and called her in for further discussion.She is very emotional and tearful about having to have another operation. She is very emotional about having surgery. We talked a long time about the need to get  a wider margin. We talked about referring her to medical oncology and radiation oncology. I offered to send her for a preop consultation with med-onc and rad-onc and she did not want to do that. She wanted to go ahead with surgery and try to keep her breast if possible.  I inquired about whether there were other social issues and she denied that. HPI  Past Medical History  Diagnosis Date  . Wears glasses   . No pertinent past medical history     Past Surgical History  Procedure Date  . Foot fusion 3/09    ankle rt  . Dilation and curettage of uterus   . Breast surgery 08/28/12    left  partial mastectomy     Family History  Problem Relation Age of Onset  . Cancer Mother     breast&blood  . Cancer Paternal Aunt     brain    Social History History  Substance Use Topics  . Smoking status: Never Smoker   . Smokeless tobacco: Never Used  . Alcohol Use: Yes     very seldom    No Known Allergies  Current Outpatient Prescriptions  Medication Sig Dispense Refill  . HYDROcodone-acetaminophen (NORCO/VICODIN) 5-325 MG per tablet Take 1-2 tablets by mouth every 4 (four) hours as needed for pain.  50 tablet  1    Review of Systems Review of Systems  Constitutional: Negative for fever, chills and unexpected weight change.  HENT: Negative for hearing loss, congestion, sore throat, trouble swallowing and voice change.   Eyes: Negative for visual disturbance.  Respiratory: Negative for cough and wheezing.   Cardiovascular: Negative for chest pain, palpitations and leg swelling.  Gastrointestinal: Negative for nausea, vomiting, abdominal pain, diarrhea, constipation, blood in stool, abdominal distention and anal bleeding.  Genitourinary: Negative for hematuria, vaginal bleeding and difficulty urinating.  Musculoskeletal: Negative for arthralgias.  Skin: Negative for rash and wound.  Neurological: Negative for seizures, syncope and headaches.  Hematological: Negative for adenopathy. Does not bruise/bleed easily.  Psychiatric/Behavioral: Negative for confusion.    Blood pressure 138/84, pulse 76, temperature 97.8 F (36.6 C), temperature source Temporal, resp. rate 16,  height 5\' 9"  (1.753 m), weight 150 lb (68.04 kg), last menstrual period 08/14/2012.  Physical Exam Physical Exam  Constitutional: She is oriented to person, place, and time. She appears well-developed and well-nourished. She appears distressed.       Tearful. emotional. Entrance questions appropriately. Good understanding of the issues.  HENT:  Head: Normocephalic and atraumatic.  Nose: Nose normal.    Mouth/Throat: No oropharyngeal exudate.  Eyes: Conjunctivae normal and EOM are normal. Pupils are equal, round, and reactive to light. Left eye exhibits no discharge. No scleral icterus.  Neck: Neck supple. No JVD present. No tracheal deviation present. No thyromegaly present.  Cardiovascular: Normal rate, regular rhythm, normal heart sounds and intact distal pulses.   No murmur heard. Pulmonary/Chest: Effort normal and breath sounds normal. No respiratory distress. She has no wheezes. She has no rales. She exhibits no tenderness.       Left breast is examined revealing incision in the upper-outer quadrant and the left axilla. Contour is good. Cosmesis is good. Lumpectomy site is thickened. No skin necrosis.  Abdominal: Soft. Bowel sounds are normal.  Musculoskeletal: She exhibits no edema and no tenderness.  Lymphadenopathy:    She has no cervical adenopathy.  Neurological: She is alert and oriented to person, place, and time. She exhibits normal muscle tone. Coordination normal.  Skin: Skin is warm. No rash noted. She is not diaphoretic. No erythema. No pallor.  Psychiatric: She has a normal mood and affect. Her behavior is normal. Judgment and thought content normal.    Data Reviewed Pathology report.  Assessment    Microinvasive cancer and DCIS left breast, upper outer quadrant., Isolated tumor cells in one of 2 sentinel nodes.,  Tmic, N)(i+).   Less than 1 mm margin medial, lateral, and inferior. She will need better margins.    Plan    We had a long talk. She is very emotional. She seems to want breast conservation. She is willing to undergo reexcision for margins. I discussed this with her in detail. She understands that she is increased risk for cosmetic deformity. She does was to go ahead and have this done.  She understands that she will be referred to medical oncology and radiation oncology postop.       [name]. Derrell Lolling, M.D., [facility] General and Minimally invasive Surgery Breast and Colorectal Surgery Office:   [phone] Pager:   [phone]  09/06/2012, 12:49 PM

## 2012-09-06 NOTE — Patient Instructions (Signed)
You will be scheduled for reexcision of your left breast lumpectomy site to get better margins.  You will be referred to a medical oncologist and a radiation oncologist postop.

## 2012-09-07 NOTE — Progress Notes (Signed)
Pt having to come back for re-excision-no labs needed

## 2012-09-07 NOTE — H&P (Signed)
Sue [name]    MRN: [id number]   Description: 46 year old female  Provider: [name], MD  Department: [facility]       Diagnoses     Cancer of upper-outer quadrant of female breast   - Primary    174.4        Vitals    BP Pulse Temp Resp Ht Wt    138/84 76 97.8 F (36.6 C) (Temporal) 16 5\' 9"  (1.753 m) 150 lb (68.04 kg)      History and Physical    [name], MD   Patient ID: Sue [name], female   DOB: 1966-11-07, 46 y.o.   MRN: [id number]            HPI Sue [name] is a 46 y.o. female.  She returns for further discussion of her left breast cancer.   This 46 year old African American female was diagnosed with ductal carcinoma in situ, receptor positive on recent image guided biopsy. This was a 1.5 cm area of microcalcifications in the upper outer quadrant of the left breast. MRI showed only biopsy changes.  Family history her reveals breast cancer in the mother, 2 paternal cousins with breast cancer. She was not interested in genetic testing preop.  All on 08/28/2012 she underwent left partial mastectomy with needle localization and sentinel node biopsy. Pathology report showed microinvasive ductal carcinoma and ductal carcinoma in situ. The DCIS was totally less than 1 mm to the inferior margin, lateral margin, and medial margin. One sentinel lymph node showed isolated tumor cells and one sentinel lymph node was negative. Pathologic stage Tmic,  N0(i+)   We called her and told her about her close margins and called her in for further discussion.She was very emotional and tearful about having to have another operation. She is very emotional about having surgery. We talked a long time about the need to get  a wider margin. We talked about referring her to medical oncology and radiation oncology. I offered to send her for a preop consultation with med-onc and rad-onc and she did not want to do that. She wanted to go ahead with surgery and  try to keep her breast if possible.   I inquired about whether there were other social issues and she denied that.      Past Medical History   Diagnosis  Date   .  Wears glasses     .  No pertinent past medical history         Past Surgical History   Procedure  Date   .  Foot fusion  3/09       ankle rt   .  Dilation and curettage of uterus     .  Breast surgery  08/28/12       left partial mastectomy        Family History   Problem  Relation  Age of Onset   .  Cancer  Mother         breast&blood   .  Cancer  Paternal Aunt         brain      Social History History   Substance Use Topics   .  Smoking status:  Never Smoker    .  Smokeless tobacco:  Never Used   .  Alcohol Use:  Yes         very seldom      No Known Allergies    Current Outpatient  Prescriptions   Medication  Sig  Dispense  Refill   .  HYDROcodone-acetaminophen (NORCO/VICODIN) 5-325 MG per tablet  Take 1-2 tablets by mouth every 4 (four) hours as needed for pain.   50 tablet   1      Review of Systems   Constitutional: Negative for fever, chills and unexpected weight change.  HENT: Negative for hearing loss, congestion, sore throat, trouble swallowing and voice change.   Eyes: Negative for visual disturbance.  Respiratory: Negative for cough and wheezing.   Cardiovascular: Negative for chest pain, palpitations and leg swelling.  Gastrointestinal: Negative for nausea, vomiting, abdominal pain, diarrhea, constipation, blood in stool, abdominal distention and anal bleeding.  Genitourinary: Negative for hematuria, vaginal bleeding and difficulty urinating.  Musculoskeletal: Negative for arthralgias.  Skin: Negative for rash and wound.  Neurological: Negative for seizures, syncope and headaches.  Hematological: Negative for adenopathy. Does not bruise/bleed easily.  Psychiatric/Behavioral: Negative for confusion.    Blood pressure 138/84, pulse 76, temperature 97.8 F (36.6 C), temperature  source Temporal, resp. rate 16, height 5\' 9"  (1.753 m), weight 150 lb (68.04 kg), last menstrual period 08/14/2012.   Physical Exam   Constitutional: She is oriented to person, place, and time. She appears well-developed and well-nourished. She appears distressed.       Tearful. emotional. Entrance questions appropriately. Good understanding of the issues.  HENT:   Head: Normocephalic and atraumatic.   Nose: Nose normal.   Mouth/Throat: No oropharyngeal exudate.  Eyes: Conjunctivae normal and EOM are normal. Pupils are equal, round, and reactive to light. Left eye exhibits no discharge. No scleral icterus.  Neck: Neck supple. No JVD present. No tracheal deviation present. No thyromegaly present.  Cardiovascular: Normal rate, regular rhythm, normal heart sounds and intact distal pulses.    No murmur heard. Pulmonary/Chest: Effort normal and breath sounds normal. No respiratory distress. She has no wheezes. She has no rales. She exhibits no tenderness.       Left breast is examined revealing incision in the upper-outer quadrant and the left axilla. Contour is good. Cosmesis is good. Lumpectomy site is thickened. No skin necrosis.  Abdominal: Soft. Bowel sounds are normal.  Musculoskeletal: She exhibits no edema and no tenderness.  Lymphadenopathy:    She has no cervical adenopathy.  Neurological: She is alert and oriented to person, place, and time. She exhibits normal muscle tone. Coordination normal.  Skin: Skin is warm. No rash noted. She is not diaphoretic. No erythema. No pallor.  Psychiatric: She has a normal mood and affect. Her behavior is normal. Judgment and thought content normal.    Data Reviewed Pathology report.   Assessment Microinvasive cancer and DCIS left breast, upper outer quadrant., Isolated tumor cells in one of 2 sentinel nodes.,  Tmic, N(i+).   Less than 1 mm margin medial, lateral, and inferior. She will need better margins.   Plan We had a long talk. She is  very emotional. She seems to want breast conservation. She is willing to undergo reexcision for margins. I discussed this with her in detail. She understands that she is increased risk for cosmetic deformity. She does was to go ahead and have this done.   She understands that she will be referred to medical oncology and radiation oncology postop.       [name]. Derrell Lolling, M.D., [facility] General and Minimally invasive Surgery Breast and Colorectal Surgery Office:   [phone] Pager:   [phone]

## 2012-09-10 ENCOUNTER — Encounter (HOSPITAL_BASED_OUTPATIENT_CLINIC_OR_DEPARTMENT_OTHER): Payer: Self-pay | Admitting: Anesthesiology

## 2012-09-10 ENCOUNTER — Ambulatory Visit (HOSPITAL_BASED_OUTPATIENT_CLINIC_OR_DEPARTMENT_OTHER): Payer: Medicaid Other | Admitting: Anesthesiology

## 2012-09-10 ENCOUNTER — Ambulatory Visit (HOSPITAL_BASED_OUTPATIENT_CLINIC_OR_DEPARTMENT_OTHER)
Admission: RE | Admit: 2012-09-10 | Discharge: 2012-09-10 | Disposition: A | Discharge status: Home / Self Care | Payer: Medicaid Other | Source: Ambulatory Visit | Attending: General Surgery | Admitting: General Surgery

## 2012-09-10 ENCOUNTER — Encounter (HOSPITAL_BASED_OUTPATIENT_CLINIC_OR_DEPARTMENT_OTHER): Payer: Self-pay | Admitting: *Deleted

## 2012-09-10 ENCOUNTER — Encounter (HOSPITAL_BASED_OUTPATIENT_CLINIC_OR_DEPARTMENT_OTHER)
Admission: RE | Disposition: A | Discharge status: Home / Self Care | Payer: Self-pay | Source: Ambulatory Visit | Attending: General Surgery

## 2012-09-10 ENCOUNTER — Telehealth (INDEPENDENT_AMBULATORY_CARE_PROVIDER_SITE_OTHER): Payer: Self-pay

## 2012-09-10 DIAGNOSIS — C50919 Malignant neoplasm of unspecified site of unspecified female breast: Secondary | ICD-10-CM

## 2012-09-10 DIAGNOSIS — C50419 Malignant neoplasm of upper-outer quadrant of unspecified female breast: Secondary | ICD-10-CM | POA: Insufficient documentation

## 2012-09-10 DIAGNOSIS — Z803 Family history of malignant neoplasm of breast: Secondary | ICD-10-CM | POA: Insufficient documentation

## 2012-09-10 DIAGNOSIS — D0590 Unspecified type of carcinoma in situ of unspecified breast: Secondary | ICD-10-CM | POA: Insufficient documentation

## 2012-09-10 SURGERY — EXCISION, LESION, BREAST
Anesthesia: General | Site: Breast | Laterality: Left | Wound class: Clean

## 2012-09-10 MED ORDER — SODIUM CHLORIDE 0.9 % IJ SOLN: 3.0000 mL | Freq: Two times a day (BID) | INTRAMUSCULAR | Status: DC

## 2012-09-10 MED ORDER — SODIUM CHLORIDE 0.9 % IV SOLN: INTRAVENOUS | Status: DC

## 2012-09-10 MED ORDER — HYDROMORPHONE HCL PF 1 MG/ML IJ SOLN
0.2500 mg | INTRAMUSCULAR | Status: DC | PRN
  Administered 2012-09-10 (×2): 0.5 mg via INTRAVENOUS

## 2012-09-10 MED ORDER — ONDANSETRON HCL 4 MG/2ML IJ SOLN: 4.0000 mg | Freq: Once | INTRAMUSCULAR | Status: DC | PRN

## 2012-09-10 MED ORDER — LIDOCAINE HCL (CARDIAC) 20 MG/ML IV SOLN
INTRAVENOUS | Status: DC | PRN
  Administered 2012-09-10: 50 mg via INTRAVENOUS

## 2012-09-10 MED ORDER — ONDANSETRON HCL 4 MG/2ML IJ SOLN: 4.0000 mg | Freq: Four times a day (QID) | INTRAMUSCULAR | Status: DC | PRN

## 2012-09-10 MED ORDER — SODIUM CHLORIDE 0.9 % IJ SOLN: 3.0000 mL | INTRAMUSCULAR | Status: DC | PRN

## 2012-09-10 MED ORDER — ACETAMINOPHEN 650 MG RE SUPP: 650.0000 mg | RECTAL | Status: DC | PRN

## 2012-09-10 MED ORDER — PROPOFOL 10 MG/ML IV BOLUS
INTRAVENOUS | Status: DC | PRN
  Administered 2012-09-10: 50 mg via INTRAVENOUS
  Administered 2012-09-10: 200 mg via INTRAVENOUS

## 2012-09-10 MED ORDER — LACTATED RINGERS IV SOLN
INTRAVENOUS | Status: DC
  Administered 2012-09-10: 13:00:00 via INTRAVENOUS

## 2012-09-10 MED ORDER — OXYCODONE HCL 5 MG/5ML PO SOLN: 5.0000 mg | Freq: Once | ORAL | Status: DC | PRN

## 2012-09-10 MED ORDER — OXYCODONE HCL 5 MG PO TABS: 5.0000 mg | ORAL_TABLET | Freq: Once | ORAL | Status: DC | PRN

## 2012-09-10 MED ORDER — CEFAZOLIN SODIUM-DEXTROSE 2-3 GM-% IV SOLR
2.0000 g | INTRAVENOUS | Status: AC
  Administered 2012-09-10: 2 g via INTRAVENOUS

## 2012-09-10 MED ORDER — OXYCODONE HCL 5 MG PO TABS: 5.0000 mg | ORAL_TABLET | ORAL | Status: DC | PRN

## 2012-09-10 MED ORDER — FENTANYL CITRATE 0.05 MG/ML IJ SOLN
INTRAMUSCULAR | Status: DC | PRN
  Administered 2012-09-10: 25 ug via INTRAVENOUS
  Administered 2012-09-10: 100 ug via INTRAVENOUS
  Administered 2012-09-10 (×3): 25 ug via INTRAVENOUS

## 2012-09-10 MED ORDER — DEXAMETHASONE SODIUM PHOSPHATE 4 MG/ML IJ SOLN
INTRAMUSCULAR | Status: DC | PRN
  Administered 2012-09-10: 10 mg via INTRAVENOUS

## 2012-09-10 MED ORDER — MORPHINE SULFATE 2 MG/ML IJ SOLN: 2.0000 mg | INTRAMUSCULAR | Status: DC | PRN

## 2012-09-10 MED ORDER — SODIUM CHLORIDE 0.9 % IV SOLN: 250.0000 mL | INTRAVENOUS | Status: DC | PRN

## 2012-09-10 MED ORDER — ACETAMINOPHEN 325 MG PO TABS: 650.0000 mg | ORAL_TABLET | ORAL | Status: DC | PRN

## 2012-09-10 MED ORDER — MIDAZOLAM HCL 5 MG/5ML IJ SOLN
INTRAMUSCULAR | Status: DC | PRN
  Administered 2012-09-10: 2 mg via INTRAVENOUS

## 2012-09-10 MED ORDER — BUPIVACAINE-EPINEPHRINE 0.5% -1:200000 IJ SOLN
INTRAMUSCULAR | Status: DC | PRN
  Administered 2012-09-10: 9.5 mL

## 2012-09-10 MED ORDER — ONDANSETRON HCL 4 MG/2ML IJ SOLN
INTRAMUSCULAR | Status: DC | PRN
  Administered 2012-09-10: 4 mg via INTRAVENOUS

## 2012-09-10 MED ORDER — CHLORHEXIDINE GLUCONATE 4 % EX LIQD: 1.0000 "application " | Freq: Once | CUTANEOUS | Status: DC

## 2012-09-10 SURGICAL SUPPLY — 60 items
ADH SKN CLS APL DERMABOND .7 (GAUZE/BANDAGES/DRESSINGS)
APL SKNCLS STERI-STRIP NONHPOA (GAUZE/BANDAGES/DRESSINGS)
APPLIER CLIP 9.375 MED OPEN (MISCELLANEOUS) ×2
APR CLP MED 9.3 20 MLT OPN (MISCELLANEOUS) ×1
BANDAGE ELASTIC 6 VELCRO ST LF (GAUZE/BANDAGES/DRESSINGS) IMPLANT
BENZOIN TINCTURE PRP APPL 2/3 (GAUZE/BANDAGES/DRESSINGS) IMPLANT
BLADE HEX COATED 2.75 (ELECTRODE) ×2 IMPLANT
BLADE SURG 15 STRL LF DISP TIS (BLADE) ×2 IMPLANT
BLADE SURG 15 STRL SS (BLADE) ×4
CANISTER SUCTION 1200CC (MISCELLANEOUS) ×2 IMPLANT
CHLORAPREP W/TINT 26ML (MISCELLANEOUS) ×2 IMPLANT
CLIP APPLIE 9.375 MED OPEN (MISCELLANEOUS) ×1 IMPLANT
CLOTH BEACON ORANGE TIMEOUT ST (SAFETY) ×2 IMPLANT
COVER MAYO STAND STRL (DRAPES) ×2 IMPLANT
COVER TABLE BACK 60X90 (DRAPES) ×2 IMPLANT
DECANTER SPIKE VIAL GLASS SM (MISCELLANEOUS) IMPLANT
DERMABOND ADVANCED (GAUZE/BANDAGES/DRESSINGS)
DERMABOND ADVANCED .7 DNX12 (GAUZE/BANDAGES/DRESSINGS) IMPLANT
DRAPE LAPAROTOMY TRNSV 102X78 (DRAPE) IMPLANT
DRAPE PED LAPAROTOMY (DRAPES) ×2 IMPLANT
DRAPE UTILITY XL STRL (DRAPES) ×2 IMPLANT
ELECT REM PT RETURN 9FT ADLT (ELECTROSURGICAL) ×2
ELECTRODE REM PT RTRN 9FT ADLT (ELECTROSURGICAL) ×1 IMPLANT
GAUZE SPONGE 4X4 12PLY STRL LF (GAUZE/BANDAGES/DRESSINGS) IMPLANT
GAUZE SPONGE 4X4 16PLY XRAY LF (GAUZE/BANDAGES/DRESSINGS) IMPLANT
GLOVE BIO SURGEON STRL SZ 6.5 (GLOVE) ×1 IMPLANT
GLOVE BIOGEL M 7.0 STRL (GLOVE) ×1 IMPLANT
GLOVE BIOGEL PI IND STRL 7.5 (GLOVE) IMPLANT
GLOVE BIOGEL PI INDICATOR 7.5 (GLOVE) ×1
GLOVE EUDERMIC 7 POWDERFREE (GLOVE) ×2 IMPLANT
GOWN PREVENTION PLUS XLARGE (GOWN DISPOSABLE) ×2 IMPLANT
GOWN PREVENTION PLUS XXLARGE (GOWN DISPOSABLE) ×2 IMPLANT
KIT MARKER MARGIN INK (KITS) IMPLANT
NDL HYPO 25X1 1.5 SAFETY (NEEDLE) ×1 IMPLANT
NEEDLE HYPO 22GX1.5 SAFETY (NEEDLE) IMPLANT
NEEDLE HYPO 25X1 1.5 SAFETY (NEEDLE) ×2 IMPLANT
NS IRRIG 1000ML POUR BTL (IV SOLUTION) ×2 IMPLANT
PACK BASIN DAY SURGERY FS (CUSTOM PROCEDURE TRAY) ×2 IMPLANT
PENCIL BUTTON HOLSTER BLD 10FT (ELECTRODE) ×2 IMPLANT
SLEEVE SCD COMPRESS KNEE MED (MISCELLANEOUS) IMPLANT
SPONGE LAP 4X18 X RAY DECT (DISPOSABLE) ×2 IMPLANT
STAPLER VISISTAT 35W (STAPLE) IMPLANT
STRIP CLOSURE SKIN 1/2X4 (GAUZE/BANDAGES/DRESSINGS) IMPLANT
SUT ETHILON 4 0 PS 2 18 (SUTURE) IMPLANT
SUT MON AB 4-0 PC3 18 (SUTURE) IMPLANT
SUT SILK 2 0 SH (SUTURE) ×2 IMPLANT
SUT VIC AB 2-0 SH 27 (SUTURE)
SUT VIC AB 2-0 SH 27XBRD (SUTURE) IMPLANT
SUT VIC AB 3-0 FS2 27 (SUTURE) IMPLANT
SUT VIC AB 4-0 P-3 18XBRD (SUTURE) IMPLANT
SUT VIC AB 4-0 P3 18 (SUTURE)
SUT VICRYL 3-0 CR8 SH (SUTURE) ×2 IMPLANT
SUT VICRYL 4-0 PS2 18IN ABS (SUTURE) IMPLANT
SYR BULB 3OZ (MISCELLANEOUS) IMPLANT
SYR CONTROL 10ML LL (SYRINGE) ×2 IMPLANT
TAPE HYPAFIX 4 X10 (GAUZE/BANDAGES/DRESSINGS) IMPLANT
TOWEL OR NON WOVEN STRL DISP B (DISPOSABLE) ×2 IMPLANT
TUBE CONNECTING 20X1/4 (TUBING) ×2 IMPLANT
WATER STERILE IRR 1000ML POUR (IV SOLUTION) ×2 IMPLANT
YANKAUER SUCT BULB TIP NO VENT (SUCTIONS) ×2 IMPLANT

## 2012-09-10 NOTE — Telephone Encounter (Signed)
Pt's husband called stating he spoke with Dr Derrell Lolling after pt's surgery and thought she had some pain med left from last surgery. He states pt advised him she only had one tablet left. Per Dr Derrell Lolling can refill hydrocodone 5/325 #30 one or two q 4-6h prn pain. Refill called to [facility] [address] [phone].

## 2012-09-10 NOTE — Anesthesia Procedure Notes (Signed)
Procedure Name: LMA Insertion Date/Time: 09/10/2012 2:05 PM Performed by: Caren Macadam Pre-anesthesia Checklist: Patient identified, Emergency Drugs available, Suction available and Patient being monitored Patient Re-evaluated:Patient Re-evaluated prior to inductionOxygen Delivery Method: Circle System Utilized Preoxygenation: Pre-oxygenation with 100% oxygen Intubation Type: IV induction Ventilation: Mask ventilation without difficulty LMA: LMA inserted LMA Size: 4.0 Number of attempts: 1 Airway Equipment and Method: bite block Placement Confirmation: positive ETCO2 and breath sounds checked- equal and bilateral Tube secured with: Tape Dental Injury: Teeth and Oropharynx as per pre-operative assessment

## 2012-09-10 NOTE — Anesthesia Postprocedure Evaluation (Signed)
  Anesthesia Post-op Note  Patient: Sue [name]  Procedure(s) Performed: Procedure(s) (LRB) with comments: RE-EXCISION OF BREAST LUMPECTOMY (Left) - Re-excision left lumpectomy site for margins  Patient Location: PACU  Anesthesia Type: General  Level of Consciousness: awake, alert  and oriented  Airway and Oxygen Therapy: Patient Spontanous Breathing  Post-op Pain: none  Post-op Assessment: Post-op Vital signs reviewed  Post-op Vital Signs: Reviewed  Complications: No apparent anesthesia complications

## 2012-09-10 NOTE — Interval H&P Note (Signed)
History and Physical Interval Note:  09/10/2012 1:54 PM  Sue [name]  has presented today for surgery, with the diagnosis of breast cancer  The goals and the  various methods of treatment have been discussed with the patient and family. After consideration of risks, benefits and other options for treatment, the patient has consented to  Procedure(s) (LRB) with comments: RE-EXCISION OF BREAST LUMPECTOMY (Left) - Re-excision left lumpectomy site for margins as a surgical intervention .  The patient's history has been reviewed, patient examined, no change in status, stable for surgery.  I have reviewed the patient's chart and labs.  Questions were answered to the patient's satisfaction.     [name]

## 2012-09-10 NOTE — Anesthesia Preprocedure Evaluation (Signed)
Anesthesia Evaluation  Patient identified by MRN, date of birth, ID band Patient awake    Reviewed: Allergy & Precautions, H&P , NPO status , Patient's Chart, lab work & pertinent test results  Airway Mallampati: I TM Distance: >3 FB Neck ROM: Full    Dental  (+) Teeth Intact and Dental Advisory Given   Pulmonary  breath sounds clear to auscultation        Cardiovascular Rhythm:Regular Rate:Normal     Neuro/Psych    GI/Hepatic   Endo/Other    Renal/GU      Musculoskeletal   Abdominal   Peds  Hematology   Anesthesia Other Findings   Reproductive/Obstetrics                           Anesthesia Physical Anesthesia Plan  ASA: II  Anesthesia Plan: General   Post-op Pain Management:    Induction: Intravenous  Airway Management Planned:   Additional Equipment:   Intra-op Plan:   Post-operative Plan: Extubation in OR  Informed Consent: I have reviewed the patients History and Physical, chart, labs and discussed the procedure including the risks, benefits and alternatives for the proposed anesthesia with the patient or authorized representative who has indicated his/her understanding and acceptance.   Dental advisory given  Plan Discussed with: Anesthesiologist, CRNA and Surgeon  Anesthesia Plan Comments:         Anesthesia Quick Evaluation

## 2012-09-10 NOTE — Progress Notes (Addendum)
Pt discharge information states that pt was to receive prescription for pain medication, but none was present.  Chart and printer checked, prescription not found. Call made to Dr. Jacinto Halim office, spoke with Arline Asp. [facility] [address] [phone]  Additional note as of 426pm- Second call with Dr Jacinto Halim office, Arline Asp. Arline Asp states that she spoke with Dr. Derrell Lolling and that he was told by the patient's husband that she had a "whole bottle" of pain medicine from previous surgery approximately two weeks ago. Pt husband denies stating that to me. Arline Asp stated that she will speak with Dr. Derrell Lolling and will ask that he call in a prescription.

## 2012-09-10 NOTE — Op Note (Signed)
Patient Name:           NIOMA MCCUBBINS   Date of Surgery:        09/10/2012  Pre op Diagnosis:      Ductal carcinoma in situ with microinvasion, left breast, upper outer quadrant with very close inferior margin, lateral margin, and medial margin  Post op Diagnosis:   Same   Procedure:                  Left partial mastectomy with reexcision of inferior margin, medial margin, and lateral margin  Surgeon:                     [name]. Derrell Lolling, M.D., FACS  Assistant:                      None  Operative Indications:  LORINDA COPLAND is a 46 y.o. female.   This 46 year old African American female was diagnosed with ductal carcinoma in situ, receptor positive on recent image guided biopsy. This was a 1.5 cm area of microcalcifications in the upper outer quadrant of the left breast. MRI showed only biopsy changes.  Family history her reveals breast cancer in the mother, 2 paternal cousins with breast cancer. She was not interested in genetic testing preop.  On 08/28/2012 she underwent left partial mastectomy with needle localization and sentinel node biopsy. Pathology report showed microinvasive ductal carcinoma and ductal carcinoma in situ. The DCIS was  less than 1 mm to the inferior margin, lateral margin, and medial margin. One sentinel lymph node showed isolated tumor cells and one sentinel lymph node was negative. Pathologic stage Tmic, N0(i+)  She has been advised of her pathology reports and has been told that her margins are to close and she should undergo re-excision. She is diligent in breast conservation and so we are returning her to the operating room for reexcision of margins.    Operative Findings:       Recent biopsy changes were noted, otherwise unremarkable  Procedure in Detail:          Following the induction of general endotracheal anesthesia the patient's left breast was prepped and draped in a sterile fashion. Intravenous antibiotics were given. Surgical time out was  performed. 0.5% Marcaine with epinephrine was used as a local infiltration anesthetic. I observed the  curvilinear incision in the upper outer quadrant left breast. Using a 15 blade I carefully reopened this incision and cut all of the Vicryl sutures until I got down to the lumpectomy cavity. I irrigated all of this out. Using electrocautery and traction and countertraction, I excised a additional margin medially, laterally, and inferiorly. Each of these specimens was about 2.5 cm x 2.5 cm x 8 or 9 mm. They were each marked with the appropriate ink color and labeled separately and sent in separate containers. Hemostasis was excellent and achieved with electrocautery. Wound was irrigated with saline. I undermined the breast tissue off the pectoralis fascia superiorly and inferiorly. I closed the breast tissue in multiple layers with interrupted sutures of 3-0 Vicryl. Prior to closure the lumpectomy cavity I placed metal clips in 5 separate locations within the lumpectomy  cavity. The skin was closed with a running subcuticular suture of 4-0 Monocryl and Dermabond. Ice pack was placed And the patient was taken to recovery in stable condition. EBL 20 cc. Counts correct. Complications none.     [name]. Derrell Lolling, M.D., FACS General and Minimally  Invasive Surgery Breast and Colorectal Surgery  09/10/2012 2:44 PM

## 2012-09-10 NOTE — Transfer of Care (Signed)
Immediate Anesthesia Transfer of Care Note  Patient: Sue [name]  Procedure(s) Performed: Procedure(s) (LRB) with comments: RE-EXCISION OF BREAST LUMPECTOMY (Left) - Re-excision left lumpectomy site for margins  Patient Location: PACU  Anesthesia Type:General  Level of Consciousness: awake  Airway & Oxygen Therapy: Patient Spontanous Breathing and Patient connected to face mask oxygen  Post-op Assessment: Report given to PACU RN and Post -op Vital signs reviewed and stable  Post vital signs: Reviewed and stable  Complications: No apparent anesthesia complications

## 2012-09-11 ENCOUNTER — Encounter (INDEPENDENT_AMBULATORY_CARE_PROVIDER_SITE_OTHER): Payer: Self-pay | Admitting: General Surgery

## 2012-09-14 ENCOUNTER — Telehealth (INDEPENDENT_AMBULATORY_CARE_PROVIDER_SITE_OTHER): Payer: Self-pay | Admitting: General Surgery

## 2012-09-14 ENCOUNTER — Other Ambulatory Visit (INDEPENDENT_AMBULATORY_CARE_PROVIDER_SITE_OTHER): Payer: Self-pay | Admitting: General Surgery

## 2012-09-14 ENCOUNTER — Telehealth: Payer: Self-pay | Admitting: *Deleted

## 2012-09-14 DIAGNOSIS — C50419 Malignant neoplasm of upper-outer quadrant of unspecified female breast: Secondary | ICD-10-CM

## 2012-09-14 NOTE — Telephone Encounter (Signed)
Confirmed 09/20/12 appt w/ pt.  Mailed before appt letter & packet to pt.  Emailed Dana at [facility] to make aware.  Took paperwork to Med Rec for chart.

## 2012-09-14 NOTE — Telephone Encounter (Signed)
Pathology on reexcision of margins:  All three specimens contain high grade DCIS, one specimen shows multifocal DCIS. Margins close in two of three specimens.  I explained this to the patient today. Will refer to med-onc and rad-onc immediately. That is her preference as well.  Probably should go ahead with genetics testing. Consider reexcision vs. Mastectomy.  [name]. Derrell Lolling, M.D., [facility] General and Minimally invasive Surgery Breast and Colorectal Surgery Office:   [phone] Pager:   [phone]

## 2012-09-18 ENCOUNTER — Other Ambulatory Visit: Payer: Self-pay | Admitting: *Deleted

## 2012-09-18 DIAGNOSIS — C50419 Malignant neoplasm of upper-outer quadrant of unspecified female breast: Secondary | ICD-10-CM

## 2012-09-20 ENCOUNTER — Other Ambulatory Visit (HOSPITAL_BASED_OUTPATIENT_CLINIC_OR_DEPARTMENT_OTHER): Payer: Medicaid Other | Admitting: Lab

## 2012-09-20 ENCOUNTER — Encounter: Payer: Self-pay | Admitting: Oncology

## 2012-09-20 ENCOUNTER — Ambulatory Visit (HOSPITAL_BASED_OUTPATIENT_CLINIC_OR_DEPARTMENT_OTHER): Payer: Medicaid Other | Admitting: Oncology

## 2012-09-20 ENCOUNTER — Ambulatory Visit: Payer: Medicaid Other

## 2012-09-20 VITALS — BP 130/80 | HR 70 | Temp 98.3°F | Resp 20 | Ht 69.0 in | Wt 152.2 lb

## 2012-09-20 DIAGNOSIS — D0590 Unspecified type of carcinoma in situ of unspecified breast: Secondary | ICD-10-CM

## 2012-09-20 DIAGNOSIS — Z170 Estrogen receptor positive status [ER+]: Secondary | ICD-10-CM

## 2012-09-20 DIAGNOSIS — C50419 Malignant neoplasm of upper-outer quadrant of unspecified female breast: Secondary | ICD-10-CM

## 2012-09-20 LAB — COMPREHENSIVE METABOLIC PANEL (CC13)
ALT: 12 U/L (ref 0–55)
AST: 15 U/L (ref 5–34)
Alkaline Phosphatase: 60 U/L (ref 40–150)
Calcium: 9.6 mg/dL (ref 8.4–10.4)
Chloride: 109 mEq/L — ABNORMAL HIGH (ref 98–107)
Creatinine: 0.8 mg/dL (ref 0.6–1.1)
Total Bilirubin: 0.38 mg/dL (ref 0.20–1.20)

## 2012-09-20 LAB — CBC WITH DIFFERENTIAL/PLATELET
BASO%: 0.9 % (ref 0.0–2.0)
EOS%: 1.6 % (ref 0.0–7.0)
HCT: 38.8 % (ref 34.8–46.6)
MCH: 35.9 pg — ABNORMAL HIGH (ref 25.1–34.0)
MCHC: 34.4 g/dL (ref 31.5–36.0)
NEUT%: 32.4 % — ABNORMAL LOW (ref 38.4–76.8)
lymph#: 2.1 10*3/uL (ref 0.9–3.3)

## 2012-09-20 NOTE — Progress Notes (Signed)
Checked in new pt with no financial concerns. °

## 2012-09-21 LAB — CANCER ANTIGEN 27.29: CA 27.29: 8 U/mL (ref 0–39)

## 2012-09-23 NOTE — Progress Notes (Signed)
CC: Sue [name]    HPI: 46 year old woman from [place] here with her husband and child for discussion of recent diagnosed DCIS.  Interval History: This patient has been referred by Dr. Derrell Lolling. She has been in general previous good health. She is undergone annual screening mammography. She had a mammogram in January 2013 which revealed some calcifications in the left breast. Spot compression views suggested a benign etiology but a six-month followup was recommended. Followup mammogram September 2013 showed suspicious calcifications biopsy is recommended. 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 MRI scan performed 07/31/2012 showed an area of enhancement consistent DCIS with clip marker present. Lumpectomy 08/28/2012 was performed. This revealed multiple and close it involved margins. 2 sentinel lymph nodes were removed both of which were negative for malignancy, but one does have isolated tumor cells seen in the subcapsular location. In addition a small tiny focus of microinvasive tumor was felt to be seen as well. The patient underwent reexcision on 09/10/2012, additional DCIS was seen adjacent to the left medial and lateral margin. The patient has been given the option for him from undergoing mastectomy or further reexcision.  Past Medical History  Diagnosis Date  . Wears glasses   . No pertinent past medical history     Past Surgical History  Procedure Date  . Foot fusion 3/09    ankle rt  . Dilation and curettage of uterus   . Breast surgery 08/28/12    left partial mastectomy     Family History  Problem Relation Age of Onset  . Cancer Mother     breast&blood  . Cancer Paternal Aunt     brain   father is alive and well at age 73 although still living mother passed away and had breast cancer She has 2 brothers one sister Gynecologic history: G1P1 Active menses, menarche 16 No hx BCP  Social History:  She is originally from [place]. She's been married for 10 years. Her husband has a truck delivery business. They have one child who is 6 Health maintenance:  The patient  reports that she has never smoked. She has never used smokeless tobacco. She reports that she drinks alcohol. She reports that she does not use illicit drugs.   Cholesterol    Bone density no  Colonoscopy no (PSA) n/a  (PAP)   Allergies: No Known Allergies     Current Outpatient Prescriptions  Medication Sig Dispense Refill  . HYDROcodone-acetaminophen (NORCO/VICODIN) 5-325 MG per tablet Take 1-2 tablets by mouth every 4 (four) hours as needed for pain.  50 tablet  1    ROS she is in general good health. She denies headaches blurred vision shortness breath or cough. She denies nausea vomiting numbness to hands and feet. Her weight is stable. She is been fairly anxious about this whole process.  Physical Exam: Anxious to womanBlood pressure 130/80, pulse 70, temperature 98.3 F (36.8 C), resp. rate 20, height 5\' 9"  (1.753 m), weight 152 lb 3.2 oz (69.037 kg), last menstrual period 08/14/2012.  Sclerae unicteric Oropharynx clear No peripheral adenopathy Lungs no rales or rhonchi Heart regular rate and rhythm Abd benign MSK no focal spinal tenderness, no peripheral edema Neuro: nonfocal Breasts: Free of any masses. Both zone negative. The left breast status post lumpectomy surgical scar is healing well there is a seroma present to  LABS   No results found for this or any previous visit (from the past 48 hour(s)).  FILMS:  No results found.   Assessment: Premenopausal woman with history of DCIS with persistently involved margins. There is evidence of microinvasive disease as well as diffuse isolated tumor cells seen in a subcapsular sinus of one lymph node. There for a long discussion about her surgical options. I emphasized that this is new surgical disease and that she is a good candidate for followup radiation if she can get  clear margins with adjuvant hormonal therapy. She may also be a candidate for her B. 43 study.    Plan: She is inclined to want to pursue further reexcision. If at that time there is further disease then she'll elect to undergo a mastectomy. Given her positive family history 80 I think she is a good candidate for genetic testing we'll make that referral. I plan to see her in followup after she has had definitive surgery.  Cait Locust 09/23/2012, 5:01 PM

## 2012-09-25 ENCOUNTER — Encounter: Payer: Self-pay | Admitting: *Deleted

## 2012-09-25 NOTE — Progress Notes (Signed)
Mailed after appt letter to pt. 

## 2012-09-26 ENCOUNTER — Ambulatory Visit
Admission: RE | Admit: 2012-09-26 | Discharge: 2012-09-26 | Disposition: A | Discharge status: Home / Self Care | Payer: Medicaid Other | Source: Ambulatory Visit | Attending: Radiation Oncology | Admitting: Radiation Oncology

## 2012-09-26 ENCOUNTER — Encounter: Payer: Self-pay | Admitting: Radiation Oncology

## 2012-09-26 VITALS — BP 99/60 | HR 61 | Temp 98.0°F | Wt 150.9 lb

## 2012-09-26 DIAGNOSIS — C50419 Malignant neoplasm of upper-outer quadrant of unspecified female breast: Secondary | ICD-10-CM

## 2012-09-26 DIAGNOSIS — C50919 Malignant neoplasm of unspecified site of unspecified female breast: Secondary | ICD-10-CM | POA: Insufficient documentation

## 2012-09-26 DIAGNOSIS — Z803 Family history of malignant neoplasm of breast: Secondary | ICD-10-CM | POA: Insufficient documentation

## 2012-09-26 HISTORY — DX: Malignant neoplasm of unspecified site of unspecified female breast: C50.919

## 2012-09-26 NOTE — Progress Notes (Addendum)
Ariived to dept. or consultation with Dr. Lonie Peak Cancer of the left Breast.  Denies any pain but reports occasional twinges in breast.  Denies any post -op fatigue.

## 2012-09-26 NOTE — Addendum Note (Signed)
Encounter addended by: Delynn Flavin, RN on: 09/26/2012  8:10 PM<BR>     Documentation filed: Charges VN

## 2012-09-26 NOTE — Progress Notes (Signed)
Radiation Oncology         [phone] ________________________________  Initial outpatient Consultation  Name: Sue [name] MRN: [id number]  Date: 09/26/2012  DOB: [birth year]  FA:OZHYQMVH,QIONGEX A, MD  Sue Cosier, MD   REFERRING PHYSICIAN: [name], MD  DIAGNOSIS: T1 mic N0(i+) M0 microinvasive  ductal carcinoma of the left breast, ER 100% PR negative  HISTORY OF PRESENT ILLNESS::Sue [name] is a 46 y.o. female  Who was found on annual screening mammography in January 2013 to have some calcifications in the left breast. Six-month followup was recommended. In September 2013 (07/19/2012)  she demonstrated suspicious calcifications warranting biopsy which revealed DCIS. MRI on 07/31/2012 showed no enhancement associated with her DCIS. This also demonstrated no suspicious adenopathy or additional findings. She underwent lumpectomy on 08/28/2012 - this demonstrated Grade I microinvasive cancer that measured less than 0.1 cm with DCIS and calcifications, high-grade, in the specimen. She had multiple close margins to the DCIS. Of note, a single sentinel lymph node in the left axilla demonstrated isolated tumor cells. An additional lymph node showed no evidence of carcinoma.   Reexcision on 09/10/2012 revealed multifocal high-grade DCIS which closely approached the new surgical margin by less than 0.1 cm At both the left lateral and left medial margins.  She has met with medical oncology and Dr. Donnie [name] has made a genetics referral.  She is going to undergo reexcision, but if her margins cannot be cleared then she will go on a mastectomy.  Her and mother had a history of breast cancer and then "blood cancer" subsequently. The patient is very tearful when she speaks about her mom.  The patient reports that she used to have significant premenstrual syndrome/moodiness but all of this largely stopped after her initial breast biopsy. She also reports that she had shooting pains  in her breasts during her premenstrual cycles but the shooting pain stopped after her biopsy.    PREVIOUS RADIATION THERAPY: No  PAST MEDICAL HISTORY:  has a past medical history of Wears glasses and Breast cancer (08/28/12).    PAST SURGICAL HISTORY: Past Surgical History  Procedure Date  . Foot fusion 3/09    ankle rt  . Dilation and curettage of uterus     Following Miscarriage  . Left breast needle core biopsy 07/26/12    UOQ - Ductal Carcinoma In Situ with Necrosis. Microcalcifications Identified  . Left breast lumpectomy 08/28/12    FAMILY HISTORY: family history includes Cancer in her mother and paternal aunt.  SOCIAL HISTORY:  reports that she has never smoked. She has never used smokeless tobacco. She reports that she drinks alcohol. She reports that she does not use illicit drugs.  ALLERGIES: Review of patient's allergies indicates no known allergies.  MEDICATIONS:  Current Outpatient Prescriptions  Medication Sig Dispense Refill  . HYDROcodone-acetaminophen (NORCO/VICODIN) 5-325 MG per tablet Take 1-2 tablets by mouth every 4 (four) hours as needed for pain.  50 tablet  1    REVIEW OF SYSTEMS:  A 15 point review of systems is  obtained.  A comprehensive review of systems was negative.   PHYSICAL EXAM:  weight is 150 lb 14.4 oz (68.448 kg). Her temperature is 98 F (36.7 C). Her blood pressure is 99/60 and her pulse is 61.   General: Alert and oriented, in no acute distress HEENT: Head is normocephalic. Extraocular movements are intact. Oropharynx is clear. Neck: Neck is supple, no palpable cervical lymphadenopathy. I palpate small, normal feeling, mobile lymph nodes,  about 5 mm, in both the right supraclavicular and left supraclavicular regions. Heart: Regular in rate and rhythm with no murmurs, rubs, or gallops. Chest: Clear to auscultation bilaterally, with no rhonchi, wheezes, or rales. Abdomen: Soft, nontender, nondistended, with no rigidity or  guarding. Extremities: No cyanosis or edema. Lymphatics: No concerning lymphadenopathy. Skin: No concerning lesions. Musculoskeletal: symmetric strength and muscle tone throughout. Neurologic: Cranial nerves II through XII are grossly intact. No obvious focalities. Speech is fluent. Coordination is intact. Psychiatric: Judgment and insight are intact. Affect is appropriate. Breasts: Left breast is tender, with postoperative swelling underneath the lumpectomy scar in the upper outer quadrant. I cannot palpate this area very deeply due to healing.  Right breast demonstrates somewhat dense and diffusely lumpy breast tissue. No palpable axillary adenopathy on the right.    LABORATORY DATA:  Lab Results  Component Value Date   WBC 4.1 09/20/2012   HGB 13.3 09/20/2012   HCT 38.8 09/20/2012   MCV 104.3* 09/20/2012   PLT 195 09/20/2012   CMP     Component Value Date/Time   NA 141 09/20/2012 1554   K 3.5 09/20/2012 1554   CL 109* 09/20/2012 1554   CO2 27 09/20/2012 1554   GLUCOSE 90 09/20/2012 1554   BUN 13.0 09/20/2012 1554   CREATININE 0.8 09/20/2012 1554   CALCIUM 9.6 09/20/2012 1554   PROT 7.5 09/20/2012 1554   ALBUMIN 4.1 09/20/2012 1554   AST 15 09/20/2012 1554   ALT 12 09/20/2012 1554   ALKPHOS 60 09/20/2012 1554   BILITOT 0.38 09/20/2012 1554         RADIOGRAPHY: Nm Sentinel Node Inj-no Rpt (breast)  08/28/2012  CLINICAL DATA: cancer left breast   Sulfur colloid was injected intradermally by the nuclear medicine  technologist for breast cancer sentinel node localization.     Mm Breast Surgical Specimen  08/28/2012  *RADIOLOGY REPORT*  Clinical Data:  Recent diagnosis of ductal carcinoma in situ in the left upper outer quadrant.  LEFT BREAST NEEDLE LOCALIZATION WITH MAMMOGRAPHIC GUIDANCE AND SPECIMEN RADIOGRAPH  Patient presents for needle localization prior to surgical excision.  The patient and I discussed the procedure of needle localization including benefits and alternatives.  We  discussed the high likelihood of a successful procedure.  We discussed the risks of the procedure, including infection, bleeding, and further surgery.  Informed written consent was given.  Using mammographic guidance, sterile technique, 2% lidocaine, and a 7 cm modified Kopans needle, the calcifications in the left upper outer quadrant were localized using a craniocaudal approach. Films were labeled and sent with the patient surgery.  She tolerated the procedure well.  Specimen radiograph was performed at [facility] and confirms the calcifications, clip and wire to be present in the tissue sample.  The specimen is marked for pathology.  IMPRESSION: Needle localization left breast.  No apparent complications.   Original Report Authenticated By: Daryl Eastern, M.D.    Mm Breast Wire Localization Left  08/28/2012  *RADIOLOGY REPORT*  Clinical Data:  Recent diagnosis of ductal carcinoma in situ in the left upper outer quadrant.  LEFT BREAST NEEDLE LOCALIZATION WITH MAMMOGRAPHIC GUIDANCE AND SPECIMEN RADIOGRAPH  Patient presents for needle localization prior to surgical excision.  The patient and I discussed the procedure of needle localization including benefits and alternatives.  We discussed the high likelihood of a successful procedure.  We discussed the risks of the procedure, including infection, bleeding, and further surgery.  Informed written consent was given.  Using mammographic guidance, sterile technique, 2% lidocaine, and a 7 cm modified Kopans needle, the calcifications in the left upper outer quadrant were localized using a craniocaudal approach. Films were labeled and sent with the patient surgery.  She tolerated the procedure well.  Specimen radiograph was performed at [facility] and confirms the calcifications, clip and wire to be present in the tissue sample.  The specimen is marked for pathology.  IMPRESSION: Needle localization left breast.  No apparent  complications.   Original Report Authenticated By: Daryl Eastern, M.D.       IMPRESSION/PLAN: Is a very pleasant 46 year old woman with T1 microscopic N0(i+) M0 invasive ductal carcinoma of the left breast associated with DCIS  1) I agree that reexcision is warranted; she understands that if she is able to conserve her breast, adjuvant radiotherapy over approximately 6 weeks would decrease her chance of local recurrence by approximately two thirds  We discussed the risks, benefits, and side effects of radiotherapy. I would give the patient at least a couple weeks to heal following surgery before starting treatment planning. We spoke about acute effects including skin irritation and fatigue as well as much less common late effects including lung and heart irritation. We spoke about the latest technology that is used to minimize the risk of late effects for breast cancer patients undergoing radiotherapy. No guarantees of treatment were given. The patient is enthusiastic about proceeding with treatment.   2) I agree with the genetics referral as recommended by medical oncology   I encouraged her to call should any questions arise in the interim. I look forward to participating in her care, as I hope she will be able to achieve breast conservation with clear margins.  __________________________________________   Lonie Peak, MD

## 2012-09-26 NOTE — Addendum Note (Signed)
Encounter addended by: Delynn Flavin, RN on: 09/26/2012  2:00 PM<BR>     Documentation filed: Chief Complaint Section

## 2012-09-27 ENCOUNTER — Ambulatory Visit (INDEPENDENT_AMBULATORY_CARE_PROVIDER_SITE_OTHER): Payer: Medicaid Other | Admitting: General Surgery

## 2012-09-27 ENCOUNTER — Encounter (INDEPENDENT_AMBULATORY_CARE_PROVIDER_SITE_OTHER): Payer: Self-pay | Admitting: General Surgery

## 2012-09-27 VITALS — BP 130/86 | HR 76 | Temp 98.3°F | Resp 14 | Ht 69.0 in | Wt 150.0 lb

## 2012-09-27 DIAGNOSIS — C50419 Malignant neoplasm of upper-outer quadrant of unspecified female breast: Secondary | ICD-10-CM

## 2012-09-27 NOTE — Addendum Note (Signed)
Encounter addended by: Akil Hoos Mintz Aldina Porta, RN on: 09/27/2012  5:46 PM<BR>     Documentation filed: Charges VN

## 2012-09-27 NOTE — Addendum Note (Signed)
Encounter addended by: Jadrian Bulman Mintz Lamiah Marmol, RN on: 09/27/2012  3:01 PM<BR>     Documentation filed: Charges VN

## 2012-09-27 NOTE — Patient Instructions (Signed)
Your second left breast operation reveals residual cancer in all 3 specimens, and 2 of these have a very close margin.  We have discussed different options for surgical management. You have also had consultations with Dr. Donnie Coffin and with Dr. Basilio Cairo.  Since it is your desire to try to re excise this area , we will schedule for reexcision of margins as the next surgical procedure.  Hopefully this will be the last operation you need, and hopefully this will control trochanter.

## 2012-09-27 NOTE — Progress Notes (Signed)
Patient ID: Sue [name], female   DOB: 1965-12-23, 46 y.o.   MRN: [id number]  Chief Complaint  Patient presents with  . Routine Post Op    lumpectomy    HPI Sue [name] is a 46 y.o. female.  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 There was a 1.5 cm area of microcalcifications present.   MRI showed this to be a solitary finding. Family history reveals breast cancer in mother, 2 paternal cousins. She's been advised to seek genetic counseling.  On 08/28/2012 she underwent a partial mastectomy and sentinel node biopsy. 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 This was very close to the inferior, lateral, and medial margins. One of the 2 lymph nodes showed isolated tumor cells. N65mic, T0 (i+).  She desired reexcision. We excised all three  margins. 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  DCIS was multifocal in one specimen.  She has seen Dr. Pierce Crane and  Dr. Basilio Cairo. We have discussed her case in  breast conference. The consensus at breast conference was that she will probably need a mastectomy for control of disease. The patient, however, strongly desires breast conservation and so we have discussed reexcision of the medial and lateral margins and we are going to do that.  She is is healing fairly well from the second operation without major problems. HPI  Past Medical History  Diagnosis Date  . Wears glasses   . Breast cancer 08/28/12    Left Breast    Past Surgical History  Procedure Date  . Foot fusion 3/09    ankle rt  . Dilation and curettage of uterus     Following Miscarriage  . Left breast needle core biopsy  07/26/12    UOQ - Ductal Carcinoma In Situ with Necrosis. Microcalcifications Identified  . Left breast lumpectomy 08/28/12    Family History  Problem Relation Age of Onset  . Cancer Mother     breast&blood  . Cancer Paternal Aunt     brain    Social History History  Substance Use Topics  . Smoking status: Never Smoker   . Smokeless tobacco: Never Used  . Alcohol Use: Yes     Comment: very seldom    No Known Allergies  No current outpatient prescriptions on file.    Review of Systems Review of Systems  Constitutional: Negative for fever, chills and unexpected weight change.  HENT: Negative for hearing loss, congestion, sore throat, trouble swallowing and voice change.   Eyes: Negative for visual disturbance.  Respiratory: Negative for cough and wheezing.   Cardiovascular: Negative for chest pain, palpitations and leg swelling.  Gastrointestinal: Negative for nausea, vomiting, abdominal pain, diarrhea, constipation, blood in stool, abdominal distention and anal bleeding.  Genitourinary: Negative for hematuria, vaginal bleeding and difficulty urinating.  Musculoskeletal: Negative for arthralgias.  Skin: Negative for rash and wound.  Neurological: Negative for seizures, syncope and headaches.  Hematological: Negative for adenopathy. Does not bruise/bleed easily.  Psychiatric/Behavioral: Negative for confusion.    Blood pressure 130/86, pulse 76, temperature 98.3 F (36.8 C), resp. rate 14, height 5\' 9"  (1.753 m), weight 150 lb (68.04 kg).  Physical Exam Physical  Exam  Constitutional: She is oriented to person, place, and time. She appears well-developed and well-nourished. No distress.  HENT:  Head: Normocephalic and atraumatic.  Eyes: Conjunctivae normal and EOM are normal. Pupils are equal, round, and reactive to light. Left eye exhibits no discharge. No scleral icterus.  Neck: Neck supple. No JVD present. No tracheal deviation present. No thyromegaly present.    Cardiovascular: Normal rate, regular rhythm, normal heart sounds and intact distal pulses.   No murmur heard. Pulmonary/Chest: Effort normal and breath sounds normal. No respiratory distress. She has no wheezes. She has no rales. She exhibits no tenderness.       Incision left breast upper outer quadrant is healing without infection. There is some edema and possibly seroma, but no drainage or skin necrosis.  Musculoskeletal: She exhibits no edema and no tenderness.  Lymphadenopathy:    She has no cervical adenopathy.  Neurological: She is alert and oriented to person, place, and time. She exhibits normal muscle tone. Coordination normal.  Skin: Skin is warm. No rash noted. She is not diaphoretic. No erythema. No pallor.  Psychiatric: She has a normal mood and affect. Her behavior is normal. Judgment and thought content normal.    Data Reviewed Notes from cancer Center. Pathology report. Discussion in breast tumor board last Wednesday.  Assessment    Microinvasive cancer and DCIS left breast, upper outer quadrant.   T73mic, N0(i+).   This appears to be high grade and somewhat multifocal and we still have very close medial and lateral border margins despite 2 excisions. She will need better margins    Plan    We had a long talk. She is less emotional today she understands her options are reexcision of the medial and lateral margins or mastectomy. She is aware that if we reexcised the margins she may still have residual disease and may have to go on to mastectomy. She understands this and still wants to try to preserve her breast.  She'll be scheduled for left partial mastectomy with reexcision of margins in the near future. She understands the indications, details, techniques, and numerous risks of the surgery.       [name]. Derrell Lolling, M.D., [facility] General and Minimally invasive Surgery Breast and Colorectal Surgery Office:   [phone] Pager:    [phone]  09/27/2012, 12:22 PM

## 2012-10-02 NOTE — Addendum Note (Signed)
Encounter addended by: Delynn Flavin, RN on: 10/02/2012  5:57 PM<BR>     Documentation filed: Charges VN

## 2012-10-29 ENCOUNTER — Encounter (HOSPITAL_COMMUNITY): Payer: Self-pay | Admitting: Pharmacy Technician

## 2012-10-29 ENCOUNTER — Encounter (HOSPITAL_COMMUNITY)
Admission: RE | Admit: 2012-10-29 | Discharge: 2012-10-29 | Disposition: A | Discharge status: Home / Self Care | Payer: Medicaid Other | Source: Ambulatory Visit | Attending: General Surgery | Admitting: General Surgery

## 2012-10-29 ENCOUNTER — Encounter (HOSPITAL_COMMUNITY): Payer: Self-pay

## 2012-10-29 LAB — BASIC METABOLIC PANEL
Calcium: 9.6 mg/dL (ref 8.4–10.5)
GFR calc Af Amer: >90 mL/min (ref >90)
GFR calc non Af Amer: >90 mL/min (ref >90)
Glucose, Bld: 82 mg/dL (ref 70–99)
Potassium: 3.4 mEq/L — ABNORMAL LOW (ref 3.5–5.1)
Sodium: 138 mEq/L (ref 135–145)

## 2012-10-29 LAB — CBC
Hemoglobin: 13.9 g/dL (ref 12.0–15.0)
MCH: 34.2 pg — ABNORMAL HIGH (ref 26.0–34.0)
MCHC: 34.1 g/dL (ref 30.0–36.0)
Platelets: 198 10*3/uL (ref 150–400)
RDW: 12.9 % (ref 11.5–15.5)

## 2012-10-29 LAB — SURGICAL PCR SCREEN
MRSA, PCR: NEGATIVE
Staphylococcus aureus: NEGATIVE

## 2012-10-29 MED ORDER — CHLORHEXIDINE GLUCONATE 4 % EX LIQD
1.0000 "application " | Freq: Once | CUTANEOUS | Status: DC
  Filled 2012-10-29: qty 15

## 2012-10-29 MED ORDER — CEFAZOLIN SODIUM-DEXTROSE 2-3 GM-% IV SOLR
2.0000 g | INTRAVENOUS | Status: AC
  Administered 2012-10-30: 2 g via INTRAVENOUS

## 2012-10-29 NOTE — H&P (Signed)
ADI SEALES     MRN: [id number]   Description: 46 year old female  Provider: [name], MD  Department: [facility]        Diagnoses     Cancer of upper-outer quadrant of female breast   - Primary    174.4          Vitals    BP Pulse Temp Resp Ht Wt    130/86 76 98.3 F (36.8 C) 14 5\' 9"  (1.753 m) 150 lb (68.04 kg)   BMI - 22.15 kg/m2                 History and Physical     [name], MD   Patient ID: Sue [name], female   DOB: 07-01-1966, 46 y.o.   MRN: [id number]             HPI Sue [name] is a 46 y.o. female.  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 There was a 1.5 cm area of microcalcifications present.   MRI showed this to be a solitary finding. Family history reveals breast cancer in mother, 2 paternal cousins. She's been advised to seek genetic counseling.   On 08/28/2012 she underwent a partial mastectomy and sentinel node biopsy. 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 This was very close to the inferior, lateral, and medial margins. One of the 2 lymph nodes showed isolated tumor cells. N50mic, T0 (i+).   She desired reexcision. We excised all three  margins. 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  DCIS was multifocal in one specimen.   She has seen Dr. Pierce Crane and  Dr. Basilio Cairo. We have discussed her case in  breast conference. The consensus at breast conference was that she will probably need a mastectomy for control of disease. The patient, however, strongly desires breast conservation and so we have discussed reexcision of the medial and lateral margins and we  are going to do that.   She is is healing fairly well from the second operation without major problems.       Past Medical History   Diagnosis  Date   .  Wears glasses     .  Breast cancer  08/28/12       Left Breast       Past Surgical History   Procedure  Date   .  Foot fusion  3/09       ankle rt   .  Dilation and curettage of uterus         Following Miscarriage   .  Left breast needle core biopsy  07/26/12       UOQ - Ductal Carcinoma In Situ with Necrosis. Microcalcifications Identified   .  Left breast lumpectomy  08/28/12       Family History   Problem  Relation  Age of Onset   .  Cancer  Mother         breast&blood   .  Cancer  Paternal Aunt         brain      Social History History   Substance Use Topics   .  Smoking status:  Never Smoker    .  Smokeless tobacco:  Never Used   .  Alcohol Use:  Yes         Comment: very seldom      No Known Allergies    No current outpatient prescriptions on file.      Review of Systems   Constitutional: Negative for fever, chills and unexpected weight change.  HENT: Negative for hearing loss, congestion, sore throat, trouble swallowing and voice change.   Eyes: Negative for visual disturbance.  Respiratory: Negative for cough and wheezing.   Cardiovascular: Negative for chest pain, palpitations and leg swelling.  Gastrointestinal: Negative for nausea, vomiting, abdominal pain, diarrhea, constipation, blood in stool, abdominal distention and anal bleeding.  Genitourinary: Negative for hematuria, vaginal bleeding and difficulty urinating.  Musculoskeletal: Negative for arthralgias.  Skin: Negative for rash and wound.  Neurological: Negative for seizures, syncope and headaches.  Hematological: Negative for adenopathy. Does not bruise/bleed easily.  Psychiatric/Behavioral: Negative for confusion.    Blood pressure 130/86, pulse 76, temperature 98.3 F (36.8 C), resp. rate 14, height 5\' 9"  (1.753 m), weight 150  lb (68.04 kg).   Physical Exam   Constitutional: She is oriented to person, place, and time. She appears well-developed and well-nourished. No distress.  HENT:   Head: Normocephalic and atraumatic.  Eyes: Conjunctivae normal and EOM are normal. Pupils are equal, round, and reactive to light. Left eye exhibits no discharge. No scleral icterus.  Neck: Neck supple. No JVD present. No tracheal deviation present. No thyromegaly present.  Cardiovascular: Normal rate, regular rhythm, normal heart sounds and intact distal pulses.    No murmur heard. Pulmonary/Chest: Effort normal and breath sounds normal. No respiratory distress. She has no wheezes. She has no rales. She exhibits no tenderness.       Incision left breast upper outer quadrant is healing without infection. There is some edema and possibly seroma, but no drainage or skin necrosis.  Musculoskeletal: She exhibits no edema and no tenderness.  Lymphadenopathy:    She has no cervical adenopathy.  Neurological: She is alert and oriented to person, place, and time. She exhibits normal muscle tone. Coordination normal.  Skin: Skin is warm. No rash noted. She is not diaphoretic. No erythema. No pallor.  Psychiatric: She has a normal mood and affect. Her behavior is normal. Judgment and thought content normal.    Data Reviewed Notes from cancer Center. Pathology report. Discussion in breast tumor board last Wednesday.   Assessment Microinvasive cancer and DCIS left breast, upper outer quadrant.   T7mic, N0(i+).   This appears to be high grade and somewhat multifocal and we still have very close medial and lateral border margins despite 2 excisions. She will need better margins   Plan We had a long talk. She is less emotional today she understands her options are reexcision of the medial and lateral margins or mastectomy. She is aware that if we reexcised the margins she may still have residual disease and may have to go on to mastectomy.  She understands the high risk for cosmetic defect.   She understands this and still wants to try to preserve her breast.   She'll be scheduled for left partial mastectomy with reexcision of margins in the near future. She understands the indications, details, techniques, and numerous risks of the surgery.       [name]. Derrell Lolling, M.D., [facility] General and Minimally invasive Surgery Breast and Colorectal Surgery Office:  [phone] Pager:   [phone]

## 2012-10-29 NOTE — Patient Instructions (Signed)
YOUR SURGERY IS SCHEDULED AT [facility]  ON:  Tuesday  12/17  REPORT TO [facility] AT:  11:15 AM      PHONE # FOR SHORT STAY IS [phone]  DO NOT EAT OR DRINK ANYTHING AFTER MIDNIGHT THE NIGHT BEFORE YOUR SURGERY.  YOU MAY BRUSH YOUR TEETH, RINSE OUT YOUR MOUTH--BUT NO WATER, NO FOOD, NO CHEWING GUM, NO MINTS, NO CANDIES, NO CHEWING TOBACCO.  PLEASE TAKE THE FOLLOWING MEDICATIONS THE AM OF YOUR SURGERY WITH A FEW SIPS OF WATER:  NO MEDICINES TO TAKE  IF YOU USE INHALERS--USE YOUR INHALERS THE AM OF YOUR SURGERY AND BRING INHALERS TO THE HOSPITAL -TAKE TO SURGERY.    IF YOU ARE DIABETIC:  DO NOT TAKE ANY DIABETIC MEDICATIONS THE AM OF YOUR SURGERY.  IF YOU TAKE INSULIN IN THE EVENINGS--PLEASE ONLY TAKE 1/2 NORMAL EVENING DOSE THE NIGHT BEFORE YOUR SURGERY.  NO INSULIN THE AM OF YOUR SURGERY.  IF YOU HAVE SLEEP APNEA AND USE CPAP OR BIPAP--PLEASE BRING THE MASK AND THE TUBING.  DO NOT BRING YOUR MACHINE.  DO NOT BRING VALUABLES, MONEY, CREDIT CARDS.  DO NOT WEAR JEWELRY, MAKE-UP, NAIL POLISH AND NO METAL PINS OR CLIPS IN YOUR HAIR. CONTACT LENS, DENTURES / PARTIALS, GLASSES SHOULD NOT BE WORN TO SURGERY AND IN MOST CASES-HEARING AIDS WILL NEED TO BE REMOVED.  BRING YOUR GLASSES CASE, ANY EQUIPMENT NEEDED FOR YOUR CONTACT LENS. FOR PATIENTS ADMITTED TO THE HOSPITAL--CHECK OUT TIME THE DAY OF DISCHARGE IS 11:00 AM.  ALL INPATIENT ROOMS ARE PRIVATE - WITH BATHROOM, TELEPHONE, TELEVISION AND WIFI INTERNET.  IF YOU ARE BEING DISCHARGED THE SAME DAY OF YOUR SURGERY--YOU CAN NOT DRIVE YOURSELF HOME--AND SHOULD NOT GO HOME ALONE BY TAXI OR BUS.  NO DRIVING OR OPERATING MACHINERY FOR 24 HOURS FOLLOWING ANESTHESIA / PAIN MEDICATIONS.  PLEASE MAKE ARRANGEMENTS FOR SOMEONE TO BE WITH YOU AT HOME THE FIRST 24 HOURS AFTER SURGERY. RESPONSIBLE DRIVER'S NAME HUSBAND-STEVEN HAYES              PHONE #   [phone]                           PLEASE READ OVER ANY  FACT SHEETS THAT YOU  WERE GIVEN: MRSA INFORMATION, BLOOD TRANSFUSION INFORMATION, INCENTIVE SPIROMETER INFORMATION. FAILURE TO FOLLOW THESE INSTRUCTIONS MAY RESULT IN THE CANCELLATION OF YOUR SURGERY.   PATIENT SIGNATURE_________________________________

## 2012-10-29 NOTE — Pre-Procedure Instructions (Signed)
PREOP CBC AND BMET WERE DONE TODAY - PREOP AT [facility] AS PER ANESTHESIOLOGIST'S GUIDELINES.  PT HAS CXR REPORT IN EPIC -FROM 08/23/12.

## 2012-10-30 ENCOUNTER — Encounter (HOSPITAL_COMMUNITY)
Admission: RE | Disposition: A | Discharge status: Home / Self Care | Payer: Self-pay | Source: Ambulatory Visit | Attending: General Surgery

## 2012-10-30 ENCOUNTER — Encounter (HOSPITAL_COMMUNITY): Payer: Self-pay | Admitting: *Deleted

## 2012-10-30 ENCOUNTER — Encounter (HOSPITAL_COMMUNITY): Payer: Self-pay | Admitting: Anesthesiology

## 2012-10-30 ENCOUNTER — Ambulatory Visit (HOSPITAL_COMMUNITY)
Admission: RE | Admit: 2012-10-30 | Discharge: 2012-10-30 | Disposition: A | Discharge status: Home / Self Care | Payer: Medicaid Other | Source: Ambulatory Visit | Attending: General Surgery | Admitting: General Surgery

## 2012-10-30 ENCOUNTER — Ambulatory Visit (HOSPITAL_COMMUNITY): Payer: Medicaid Other | Admitting: Anesthesiology

## 2012-10-30 DIAGNOSIS — C50919 Malignant neoplasm of unspecified site of unspecified female breast: Secondary | ICD-10-CM

## 2012-10-30 DIAGNOSIS — C50419 Malignant neoplasm of upper-outer quadrant of unspecified female breast: Secondary | ICD-10-CM | POA: Diagnosis present

## 2012-10-30 DIAGNOSIS — Z01812 Encounter for preprocedural laboratory examination: Secondary | ICD-10-CM | POA: Insufficient documentation

## 2012-10-30 DIAGNOSIS — Z9010 Acquired absence of unspecified breast and nipple: Secondary | ICD-10-CM | POA: Insufficient documentation

## 2012-10-30 HISTORY — PX: RE-EXCISION OF BREAST CANCER,SUPERIOR MARGINS: SHX6047

## 2012-10-30 SURGERY — RE-EXCISION OF BREAST CANCER,SUPERIOR MARGINS
Anesthesia: General | Site: Breast | Wound class: Clean

## 2012-10-30 MED ORDER — EPHEDRINE SULFATE 50 MG/ML IJ SOLN
INTRAMUSCULAR | Status: DC | PRN
  Administered 2012-10-30 (×2): 5 mg via INTRAVENOUS

## 2012-10-30 MED ORDER — HEPARIN SODIUM (PORCINE) 5000 UNIT/ML IJ SOLN
5000.0000 [IU] | Freq: Once | INTRAMUSCULAR | Status: AC
  Administered 2012-10-30: 5000 [IU] via SUBCUTANEOUS
  Filled 2012-10-30: qty 1

## 2012-10-30 MED ORDER — MIDAZOLAM HCL 5 MG/5ML IJ SOLN
INTRAMUSCULAR | Status: DC | PRN
  Administered 2012-10-30 (×2): 1 mg via INTRAVENOUS

## 2012-10-30 MED ORDER — 0.9 % SODIUM CHLORIDE (POUR BTL) OPTIME
TOPICAL | Status: DC | PRN
  Administered 2012-10-30: 1000 mL

## 2012-10-30 MED ORDER — FENTANYL CITRATE 0.05 MG/ML IJ SOLN
25.0000 ug | INTRAMUSCULAR | Status: DC | PRN
  Administered 2012-10-30: 50 ug via INTRAVENOUS

## 2012-10-30 MED ORDER — SODIUM CHLORIDE 0.9 % IJ SOLN: 3.0000 mL | Freq: Two times a day (BID) | INTRAMUSCULAR | Status: DC

## 2012-10-30 MED ORDER — CEFAZOLIN SODIUM-DEXTROSE 2-3 GM-% IV SOLR
INTRAVENOUS | Status: AC
  Filled 2012-10-30: qty 50

## 2012-10-30 MED ORDER — FENTANYL CITRATE 0.05 MG/ML IJ SOLN
INTRAMUSCULAR | Status: DC | PRN
  Administered 2012-10-30 (×5): 50 ug via INTRAVENOUS

## 2012-10-30 MED ORDER — OXYCODONE HCL 5 MG PO TABS: 5.0000 mg | ORAL_TABLET | ORAL | Status: DC | PRN

## 2012-10-30 MED ORDER — HYDROCODONE-ACETAMINOPHEN 5-325 MG PO TABS: 1.0000 | ORAL_TABLET | ORAL | Status: DC | PRN

## 2012-10-30 MED ORDER — LACTATED RINGERS IV SOLN
INTRAVENOUS | Status: DC
  Administered 2012-10-30: 14:00:00 via INTRAVENOUS
  Administered 2012-10-30: 1000 mL via INTRAVENOUS

## 2012-10-30 MED ORDER — MORPHINE SULFATE 10 MG/ML IJ SOLN
INTRAMUSCULAR | Status: AC
  Filled 2012-10-30: qty 1

## 2012-10-30 MED ORDER — MORPHINE SULFATE 10 MG/ML IJ SOLN
2.0000 mg | INTRAMUSCULAR | Status: DC | PRN
  Administered 2012-10-30: 2 mg via INTRAVENOUS

## 2012-10-30 MED ORDER — ACETAMINOPHEN 650 MG RE SUPP
650.0000 mg | RECTAL | Status: DC | PRN
  Filled 2012-10-30: qty 1

## 2012-10-30 MED ORDER — SODIUM CHLORIDE 0.9 % IV SOLN: 250.0000 mL | INTRAVENOUS | Status: DC | PRN

## 2012-10-30 MED ORDER — ACETAMINOPHEN 325 MG PO TABS: 650.0000 mg | ORAL_TABLET | ORAL | Status: DC | PRN

## 2012-10-30 MED ORDER — ONDANSETRON HCL 4 MG/2ML IJ SOLN: 4.0000 mg | Freq: Four times a day (QID) | INTRAMUSCULAR | Status: DC | PRN

## 2012-10-30 MED ORDER — FENTANYL CITRATE 0.05 MG/ML IJ SOLN
INTRAMUSCULAR | Status: AC
  Filled 2012-10-30: qty 2

## 2012-10-30 MED ORDER — BUPIVACAINE-EPINEPHRINE PF 0.5-1:200000 % IJ SOLN
INTRAMUSCULAR | Status: DC | PRN
  Administered 2012-10-30: 19 mL

## 2012-10-30 MED ORDER — DEXAMETHASONE SODIUM PHOSPHATE 10 MG/ML IJ SOLN
INTRAMUSCULAR | Status: DC | PRN
  Administered 2012-10-30: 10 mg via INTRAVENOUS

## 2012-10-30 MED ORDER — PROPOFOL 10 MG/ML IV EMUL
INTRAVENOUS | Status: DC | PRN
  Administered 2012-10-30: 200 mg via INTRAVENOUS

## 2012-10-30 MED ORDER — SODIUM CHLORIDE 0.9 % IV SOLN: INTRAVENOUS | Status: DC

## 2012-10-30 MED ORDER — LIDOCAINE HCL (CARDIAC) 20 MG/ML IV SOLN
INTRAVENOUS | Status: DC | PRN
  Administered 2012-10-30: 75 mg via INTRAVENOUS

## 2012-10-30 MED ORDER — ACETAMINOPHEN 10 MG/ML IV SOLN
INTRAVENOUS | Status: AC
  Filled 2012-10-30: qty 100

## 2012-10-30 MED ORDER — BUPIVACAINE-EPINEPHRINE (PF) 0.5% -1:200000 IJ SOLN
INTRAMUSCULAR | Status: AC
  Filled 2012-10-30: qty 10

## 2012-10-30 MED ORDER — PROMETHAZINE HCL 25 MG/ML IJ SOLN: 6.2500 mg | INTRAMUSCULAR | Status: DC | PRN

## 2012-10-30 MED ORDER — ACETAMINOPHEN 10 MG/ML IV SOLN
INTRAVENOUS | Status: DC | PRN
  Administered 2012-10-30: 1000 mg via INTRAVENOUS

## 2012-10-30 MED ORDER — SODIUM CHLORIDE 0.9 % IJ SOLN: 3.0000 mL | INTRAMUSCULAR | Status: DC | PRN

## 2012-10-30 MED ORDER — ONDANSETRON HCL 4 MG/2ML IJ SOLN
INTRAMUSCULAR | Status: DC | PRN
  Administered 2012-10-30 (×2): 2 mg via INTRAVENOUS

## 2012-10-30 SURGICAL SUPPLY — 37 items
ADH SKN CLS APL DERMABOND .7 (GAUZE/BANDAGES/DRESSINGS) ×1
APL SKNCLS STERI-STRIP NONHPOA (GAUZE/BANDAGES/DRESSINGS)
APPLIER CLIP 11 MED OPEN (CLIP)
APR CLP MED 11 20 MLT OPN (CLIP)
BENZOIN TINCTURE PRP APPL 2/3 (GAUZE/BANDAGES/DRESSINGS) ×1 IMPLANT
BINDER BREAST LRG (GAUZE/BANDAGES/DRESSINGS) ×1 IMPLANT
BLADE HEX COATED 2.75 (ELECTRODE) ×2 IMPLANT
CLIP APPLIE 11 MED OPEN (CLIP) IMPLANT
CLIP TI WIDE RED SMALL 6 (CLIP) IMPLANT
CLOTH BEACON ORANGE TIMEOUT ST (SAFETY) ×2 IMPLANT
DERMABOND ADVANCED (GAUZE/BANDAGES/DRESSINGS) ×1
DERMABOND ADVANCED .7 DNX12 (GAUZE/BANDAGES/DRESSINGS) IMPLANT
DISSECTOR ROUND CHERRY 3/8 STR (MISCELLANEOUS) IMPLANT
DRAIN CHANNEL 15F RND FF 3/16 (WOUND CARE) IMPLANT
DRAPE LAPAROSCOPIC ABDOMINAL (DRAPES) ×2 IMPLANT
DRAPE POUCH INSTRU U-SHP 10X18 (DRAPES) ×1 IMPLANT
ELECT REM PT RETURN 9FT ADLT (ELECTROSURGICAL) ×2
ELECTRODE REM PT RTRN 9FT ADLT (ELECTROSURGICAL) ×1 IMPLANT
GLOVE BIOGEL PI IND STRL 6.5 (GLOVE) IMPLANT
GLOVE BIOGEL PI IND STRL 7.0 (GLOVE) ×1 IMPLANT
GLOVE BIOGEL PI INDICATOR 6.5 (GLOVE) ×1
GLOVE BIOGEL PI INDICATOR 7.0 (GLOVE)
GLOVE EUDERMIC 7 POWDERFREE (GLOVE) ×2 IMPLANT
GLOVE SURG SS PI 6.5 STRL IVOR (GLOVE) ×1 IMPLANT
GOWN STRL NON-REIN LRG LVL3 (GOWN DISPOSABLE) ×2 IMPLANT
GOWN STRL REIN XL XLG (GOWN DISPOSABLE) ×3 IMPLANT
MARKER SKIN DUAL TIP RULER LAB (MISCELLANEOUS) ×1 IMPLANT
NEEDLE HYPO 22GX1.5 SAFETY (NEEDLE) ×2 IMPLANT
PACK GENERAL/GYN (CUSTOM PROCEDURE TRAY) ×2 IMPLANT
SPONGE GAUZE 4X4 12PLY (GAUZE/BANDAGES/DRESSINGS) ×1 IMPLANT
STAPLER VISISTAT 35W (STAPLE) ×2 IMPLANT
STRIP CLOSURE SKIN 1/2X4 (GAUZE/BANDAGES/DRESSINGS) ×1 IMPLANT
SUT MNCRL AB 4-0 PS2 18 (SUTURE) ×1 IMPLANT
SUT VIC AB 3-0 SH 18 (SUTURE) ×1 IMPLANT
SYR CONTROL 10ML LL (SYRINGE) ×2 IMPLANT
TOWEL OR 17X26 10 PK STRL BLUE (TOWEL DISPOSABLE) ×3 IMPLANT
TOWEL OR NON WOVEN STRL DISP B (DISPOSABLE) ×1 IMPLANT

## 2012-10-30 NOTE — Op Note (Signed)
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 Derrell Lolling, M.D., FACS  Assistant:                      None  Operative Indications:   Sue [name] is a 46 y.o. female. 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 There was a 1.5 cm area of microcalcifications present. MRI showed this to be a solitary finding. Family history reveals breast cancer in mother, 2 paternal cousins. She's been advised to seek genetic counseling.  On 08/28/2012 she underwent a partial mastectomy and sentinel node biopsy. 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 This was very close to the inferior, lateral, and medial margins. One of the 2 lymph nodes showed isolated tumor cells. N38mic, T0 (i+).  She desired reexcision. We excised all three margins. 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 DCIS was multifocal in one specimen.  She has seen Dr. Pierce Crane and Dr. Basilio Cairo. We have discussed her case in breast conference. The consensus at breast conference was that she will probably need a mastectomy for control of disease. The patient, however, strongly desires breast conservation and so we have discussed reexcision of the medial and lateral margins and we are going to do that.   Operative Findings:       There was a small hematoma cavity which was completely removed. I excised a thin ellipse of skin. Medially and laterally after the dissection all the way down to the  pectoralis fascia.  Procedure in Detail:          Following the induction of general endotracheal anesthesia intravenous antibiotics were given, surgical time out performed, and the left breast was prepped and draped in sterile fashion. 0.5% Marcaine with epinephrine was used as local infiltration anesthetic. I observed the healing curvilinear incision in the left breast, superiorly and superolaterally. I made a very thin elliptical incision around this scar excising the old scar. I took the dissection down to the pectoralis fascia, in all directions, completely excising the hematoma cavity and taking almost 1 cm of breast tissue medially and laterally. I marked the new medial margin and the new lateral margin with the colored ink and sent the  specimens fresh to the lab for evaluation. Hemostasis was excellent and achieved with   electrocautery. It was irrigated with saline. With electrocautery I  undermined the breast tissue off of the pectoralis major muscle both superiorly and inferiorly so as to slide the tissues back together. I then sewed the breast tissue together in  layers with interrupted sutures of 3-0 Vicryl. The deepest layer I took a bite of pectoralis fascia  with each stitch to  try to close down the dead space. I closed the skin with a running subcuticular suture of 4-0 Monocryl and Dermabond. A breast binder was placed and  the patient taken to PACU in stable condition. EBL 20 cc or less. Counts correct. Complications none.     [name]. Derrell Lolling, M.D., FACS General and Minimally Invasive Surgery Breast and Colorectal Surgery  10/30/2012 2:08 PM

## 2012-10-30 NOTE — Anesthesia Postprocedure Evaluation (Signed)
  Anesthesia Post-op Note  Patient: Sue [name]  Procedure(s) Performed: Procedure(s) (LRB): RE-EXCISION OF BREAST CANCER,SUPERIOR MARGINS (N/A)  Patient Location: PACU  Anesthesia Type: General  Level of Consciousness: awake and alert   Airway and Oxygen Therapy: Patient Spontanous Breathing  Post-op Pain: mild  Post-op Assessment: Post-op Vital signs reviewed, Patient's Cardiovascular Status Stable, Respiratory Function Stable, Patent Airway and No signs of Nausea or vomiting  Last Vitals:  Filed Vitals:   10/30/12 1440  BP:   Pulse: 62  Temp:   Resp: 20    Post-op Vital Signs: stable   Complications: No apparent anesthesia complications

## 2012-10-30 NOTE — Transfer of Care (Signed)
Immediate Anesthesia Transfer of Care Note  Patient: Sue [name]  Procedure(s) Performed: Procedure(s) (LRB) with comments: RE-EXCISION OF BREAST CANCER,SUPERIOR MARGINS (N/A) - left partial mastectomy with excision of margins  Patient Location: PACU  Anesthesia Type:General  Level of Consciousness: awake, alert , oriented and patient cooperative  Airway & Oxygen Therapy: Patient Spontanous Breathing and Patient connected to face mask oxygen  Post-op Assessment: Report given to PACU RN, Post -op Vital signs reviewed and stable and Patient moving all extremities  Post vital signs: Reviewed and stable  Complications: No apparent anesthesia complications

## 2012-10-30 NOTE — Interval H&P Note (Signed)
History and Physical Interval Note:  10/30/2012 12:45 PM  Sue [name]  has presented today for surgery, with the diagnosis of breast cancer  The goals and the various methods of treatment have been discussed with the patient and family. After consideration of risks, benefits and other options for treatment, the patient has consented to  Procedure(s) (LRB) with comments: RE-EXCISION OF BREAST CANCER MARGINS (N/A) - re-excision of margins as a surgical intervention .  The patient's history has been reviewed, patient examined today, no change in status, stable for surgery.  I have reviewed the patient's chart and labs.  Questions were answered to the patient's satisfaction.     [name]

## 2012-10-30 NOTE — Anesthesia Preprocedure Evaluation (Addendum)
Anesthesia Evaluation  Patient identified by MRN, date of birth, ID band Patient awake  General Assessment Comment:Breast cancer.  Reviewed: Allergy & Precautions, H&P , NPO status , Patient's Chart, lab work & pertinent test results, reviewed documented beta blocker date and time   Airway Mallampati: II TM Distance: >3 FB Neck ROM: full    Dental No notable dental hx.    Pulmonary neg pulmonary ROS,  breath sounds clear to auscultation  Pulmonary exam normal       Cardiovascular Exercise Tolerance: Good negative cardio ROS  Rhythm:regular Rate:Normal     Neuro/Psych negative neurological ROS  negative psych ROS   GI/Hepatic negative GI ROS, Neg liver ROS,   Endo/Other  negative endocrine ROS  Renal/GU negative Renal ROS  negative genitourinary   Musculoskeletal   Abdominal   Peds  Hematology negative hematology ROS (+)   Anesthesia Other Findings   Reproductive/Obstetrics negative OB ROS                          Anesthesia Physical Anesthesia Plan  ASA: II  Anesthesia Plan: General LMA   Post-op Pain Management:    Induction:   Airway Management Planned:   Additional Equipment:   Intra-op Plan:   Post-operative Plan:   Informed Consent: I have reviewed the patients History and Physical, chart, labs and discussed the procedure including the risks, benefits and alternatives for the proposed anesthesia with the patient or authorized representative who has indicated his/her understanding and acceptance.   Dental Advisory Given  Plan Discussed with: CRNA  Anesthesia Plan Comments: (LMA #4 09/10/12)       Anesthesia Quick Evaluation

## 2012-10-30 NOTE — Preoperative (Signed)
Beta Blockers   Reason not to administer Beta Blockers:Not Applicable 

## 2012-10-31 ENCOUNTER — Encounter (HOSPITAL_COMMUNITY): Payer: Self-pay | Admitting: General Surgery

## 2012-11-05 ENCOUNTER — Telehealth (INDEPENDENT_AMBULATORY_CARE_PROVIDER_SITE_OTHER): Payer: Self-pay | Admitting: General Surgery

## 2012-11-05 ENCOUNTER — Other Ambulatory Visit (INDEPENDENT_AMBULATORY_CARE_PROVIDER_SITE_OTHER): Payer: Self-pay | Admitting: General Surgery

## 2012-11-05 DIAGNOSIS — C50419 Malignant neoplasm of upper-outer quadrant of unspecified female breast: Secondary | ICD-10-CM

## 2012-11-05 NOTE — Telephone Encounter (Signed)
Tried to call the patient to advise of pathology report reviewed by Dr. Derrell Lolling. Message of "unavailable" came up. Will try again later today to advise.  Per Dr. Derrell Lolling "we (finally!) have clear margins. She needs to see medical oncology and radiation oncology asap".

## 2012-11-05 NOTE — Progress Notes (Signed)
Quick Note:  Inform patient of Pathology report,. Let her know that we (finally!) have clear margins. She needs top see medical oncology and radiation oncology ASAP. ______

## 2012-11-06 ENCOUNTER — Telehealth: Payer: Self-pay | Admitting: *Deleted

## 2012-11-06 ENCOUNTER — Telehealth (INDEPENDENT_AMBULATORY_CARE_PROVIDER_SITE_OTHER): Payer: Self-pay | Admitting: General Surgery

## 2012-11-06 NOTE — Telephone Encounter (Signed)
Tried to reach patient again. Tried again after four attempts yesterday afternoon (after response from Derrell Lolling was received). Phone still unavailable. Wanted to advise patient of pathology result per Dr. Derrell Lolling: "Let her know that we (finally!) have clear margins. She needs to see medical oncology and radiation oncology ASAP."

## 2012-11-06 NOTE — Telephone Encounter (Signed)
Tried to all the patient and her cell phone will no allow me to get through to her right now.  I will try back in a little while.

## 2012-11-09 ENCOUNTER — Telehealth: Payer: Self-pay | Admitting: *Deleted

## 2012-11-09 NOTE — Telephone Encounter (Signed)
Cannot get through on phone.  Will try again.

## 2012-11-15 ENCOUNTER — Encounter (INDEPENDENT_AMBULATORY_CARE_PROVIDER_SITE_OTHER): Payer: Self-pay | Admitting: General Surgery

## 2012-11-20 ENCOUNTER — Ambulatory Visit (INDEPENDENT_AMBULATORY_CARE_PROVIDER_SITE_OTHER): Payer: Medicaid Other | Admitting: General Surgery

## 2012-11-20 ENCOUNTER — Encounter (INDEPENDENT_AMBULATORY_CARE_PROVIDER_SITE_OTHER): Payer: Self-pay | Admitting: General Surgery

## 2012-11-20 ENCOUNTER — Other Ambulatory Visit: Payer: Self-pay | Admitting: Oncology

## 2012-11-20 VITALS — BP 130/70 | HR 74 | Temp 97.8°F | Resp 18 | Ht 69.0 in | Wt 160.0 lb

## 2012-11-20 DIAGNOSIS — C50419 Malignant neoplasm of upper-outer quadrant of unspecified female breast: Secondary | ICD-10-CM

## 2012-11-20 NOTE — Progress Notes (Signed)
Patient ID: Sue [name], female   DOB: 05-29-1966, 47 y.o.   MRN: [id number] History: This patient returns following her second re-excision for margins, and her margins are now clear. She was initially diagnosed with DCIS, receptor positive. Solitary finding on MRI. Family history positive for breast cancer in mother and 2 cousins. Genetic   counseling has been recommended. On 08/28/2012 she underwent left partial mastectomy and sentinel biopsy. She had microinvasive carcinoma with very close margins, one of 2 lymph nodes showed isolated tumor cells. She did not want mastectomy and we reexcised the margins and she had high-grade DCIS in all 3 specimens. We talked about this. She did not want to have mastectomy. She underwent reexcision of medial margin and reexcision of lateral margin on 10/30/2012 and the final report showed benign breast tissue, no evidence of residual DCIS. She is doing well surgically. Her clothes fit well. She doesn't have much of a defect. She doesn't have much pain. She is pleased. She has appointments with medical oncology and radiation oncology within the next week.  Exam: Patient looks well. No distress Left breast actually looks very good, considering.. Minimal defect. Reasonably normal contour. Incision is healing well without hematoma or seroma. 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   [name]. Derrell Lolling, M.D., [facility] General and Minimally invasive Surgery Breast and Colorectal Surgery Office:   [phone] Pager:    [phone]

## 2012-11-20 NOTE — Patient Instructions (Signed)
The final pathology report on your third excision shows no evidence of cancer cells. You will not require any further surgery. All of your surgical incisions are healing without any sign of any complication.  Be sure to keep your appointments with the radiation oncologist and medical oncologist at the cancer Center. They will put together your treatment plan at the next visit  Return to see Dr. Derrell Lolling in 4 months, sooner if there are any problems.

## 2012-11-21 ENCOUNTER — Telehealth: Payer: Self-pay | Admitting: *Deleted

## 2012-11-21 ENCOUNTER — Ambulatory Visit
Admission: RE | Admit: 2012-11-21 | Discharge: 2012-11-21 | Disposition: A | Discharge status: Home / Self Care | Payer: Medicaid Other | Source: Ambulatory Visit | Attending: Radiation Oncology | Admitting: Radiation Oncology

## 2012-11-21 ENCOUNTER — Encounter: Payer: Self-pay | Admitting: Radiation Oncology

## 2012-11-21 VITALS — BP 106/72 | HR 65 | Temp 98.1°F | Ht 69.0 in | Wt 161.2 lb

## 2012-11-21 DIAGNOSIS — C50419 Malignant neoplasm of upper-outer quadrant of unspecified female breast: Secondary | ICD-10-CM

## 2012-11-21 DIAGNOSIS — C50919 Malignant neoplasm of unspecified site of unspecified female breast: Secondary | ICD-10-CM | POA: Insufficient documentation

## 2012-11-21 NOTE — Progress Notes (Signed)
Here for Radiation Oncologyu appt. For re-assessment of breast cancer

## 2012-11-21 NOTE — Telephone Encounter (Signed)
Could not get through on phone - emailed pt to call me.

## 2012-11-21 NOTE — Progress Notes (Signed)
No voiced concerns today.  Seen by Dr. Derrell Lolling yesterday and he informed her that she did not need chemotherapy.  Denies any pain.

## 2012-11-21 NOTE — Progress Notes (Signed)
  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 She underwent reexcision once more on 10/30/2012. Finally, her margins are clear which is great. She has not yet been initiated with a new medical oncologist since Dr. Renelda Loma leaving. It appears that medical oncology as not been able to reach her by phone. I just sent an e-mail to medical oncology as well as a voice mail message. I asked that she be initiated with a medical oncologist in the next 6 days so as not to delay treatment planning for her radiotherapy. My suspicion is that she will not need chemotherapy but I would like their final opinion on this. I informed medical oncology that the best phone number to reach is [phone].  She has no new complaints. She is recovering well from reexcision. She saw Dr.Ingram yesterday and was released for adjuvant therapy.  She still has not seen a Runner, broadcasting/film/video as previously recommended by Dr. Donnie Coffin. I will contact [name].   ALLERGIES:   has no known allergies.  Meds: No current outpatient prescriptions on file.    Physical Findings: The patient is in no acute distress. Patient is alert and oriented.  height is 5\' 9"  (1.753 m) and weight is 161 lb 3.2 oz (73.12 kg). Her temperature is 98.1 F (36.7 C). Her blood pressure is 106/72 and her pulse is 65. .  No significant changes. Left breast shows some  Concavity/tissue defect representative of tissue loss from surgery and reexcision. She is healing well.  Lab Findings: Lab Results  Component Value Date   WBC 4.4 10/29/2012   HGB 13.9 10/29/2012   HCT 40.8 10/29/2012   MCV  100.2* 10/29/2012   PLT 198 10/29/2012      Radiographic Findings: No results found.  Impression/Plan:   1) I will contact genetics counseling regarding scheduling a visit with [name].  2) I have re-contacted medical oncology so the patient is seen ASAP; my impression from Dr. Renelda Loma notes is that she will not need chemotherapy but I except she will at least receive anti-estrogen therapy.  3) I have scheduled a CT simulation for Wednesday, January 15. I've asked that she patient be seen in medical oncology before this so the treatment planning does not start until we have their final opinion regarding systemic treatment.  We once again discussed the risks, benefits, and side effects of radiotherapy. We discussed that radiation would take approximately 6 weeks to complete. We spoke about acute effects including skin irritation and fatigue as well as much less common late effects including lung and heart irritation. We spoke about the latest technology that is used to minimize the risk of late effects for breast cancer patients undergoing radiotherapy. No guarantees of treatment were given. The patient is enthusiastic about proceeding with treatment. I look forward to participating in her care. _____________________________________   Lonie Peak, MD

## 2012-11-22 ENCOUNTER — Telehealth: Payer: Self-pay | Admitting: *Deleted

## 2012-11-22 NOTE — Telephone Encounter (Signed)
Received call from pt and confirmed 11/26/12 appt w/ her.  Mailed before appt letter & packet to pt.  Emailed Annabelle Harman at [employer] Dr. Basilio Cairo to make aware.  Took paperwork to Med Rec for chart.

## 2012-11-22 NOTE — Addendum Note (Signed)
Encounter addended by: Carma Dwiggins Mintz Miah Boye, RN on: 11/22/2012  6:08 PM<BR>     Documentation filed: Charges VN

## 2012-11-23 ENCOUNTER — Other Ambulatory Visit: Payer: Self-pay | Admitting: Radiation Oncology

## 2012-11-23 DIAGNOSIS — C50419 Malignant neoplasm of upper-outer quadrant of unspecified female breast: Secondary | ICD-10-CM

## 2012-11-24 ENCOUNTER — Encounter: Payer: Self-pay | Admitting: *Deleted

## 2012-11-24 ENCOUNTER — Encounter: Payer: Self-pay | Admitting: Oncology

## 2012-11-24 ENCOUNTER — Other Ambulatory Visit: Payer: Self-pay | Admitting: *Deleted

## 2012-11-24 DIAGNOSIS — C50419 Malignant neoplasm of upper-outer quadrant of unspecified female breast: Secondary | ICD-10-CM

## 2012-11-26 ENCOUNTER — Ambulatory Visit (HOSPITAL_BASED_OUTPATIENT_CLINIC_OR_DEPARTMENT_OTHER): Payer: Medicaid Other | Admitting: Oncology

## 2012-11-26 ENCOUNTER — Other Ambulatory Visit: Payer: Self-pay | Admitting: Oncology

## 2012-11-26 ENCOUNTER — Other Ambulatory Visit (HOSPITAL_COMMUNITY): Payer: Self-pay | Admitting: Obstetrics

## 2012-11-26 ENCOUNTER — Ambulatory Visit: Payer: Medicaid Other

## 2012-11-26 ENCOUNTER — Encounter: Payer: Self-pay | Admitting: Oncology

## 2012-11-26 ENCOUNTER — Other Ambulatory Visit: Payer: Medicaid Other | Admitting: Lab

## 2012-11-26 VITALS — BP 114/75 | HR 64 | Temp 97.5°F | Resp 20 | Ht 69.0 in | Wt 162.8 lb

## 2012-11-26 DIAGNOSIS — Z1231 Encounter for screening mammogram for malignant neoplasm of breast: Secondary | ICD-10-CM

## 2012-11-26 DIAGNOSIS — Z170 Estrogen receptor positive status [ER+]: Secondary | ICD-10-CM

## 2012-11-26 DIAGNOSIS — C50419 Malignant neoplasm of upper-outer quadrant of unspecified female breast: Secondary | ICD-10-CM

## 2012-11-26 DIAGNOSIS — D0590 Unspecified type of carcinoma in situ of unspecified breast: Secondary | ICD-10-CM

## 2012-11-26 NOTE — Progress Notes (Signed)
ID: Sue [name]   DOB: [birth year]  MR#: [id number]  CSN#:[id number]  PCP: Sue Cosier, MD GYN:  SUClaud [name] OTHER MD: Sue [name]   HISTORY OF PRESENT ILLNESS: She had a mammogram in January 2013 which revealed some calcifications in the left breast. Spot compression views suggested a benign etiology but a six-month followup was recommended. Followup mammogram September 2013 showed suspicious calcifications biopsy is recommended. 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 MRI scan performed 07/31/2012 showed an area of enhancement consistent DCIS with clip marker present.  Lumpectomy 08/28/2012 was performed. This revealed multiple and close it involved margins. 2 sentinel lymph nodes were removed both of which were negative for malignancy, but one does have isolated tumor cells seen in the subcapsular location. In addition a small tiny focus of microinvasive tumor was felt to be seen as well.  The patient underwent reexcision on 09/10/2012, additional DCIS was seen adjacent to the left medial and lateral margin. Her subsequent history is as detailed below  INTERVAL HISTORY: Sue [name] returns today after having her margin clearance surgery 10/30/2012. This was successful. She tolerated the surgery quite well, without unusual fever, bleeding, pain, or dehiscence.  REVIEW OF SYSTEMS: She's not having any unusual headaches, visual changes, cough, phlegm production, pleurisy, shortness of breath, change in bowel or bladder habits, or any arthritis discomfort. In fact a detailed review of systems today was entirely negative.  PAST MEDICAL HISTORY: Past Medical History  Diagnosis Date  . Wears glasses   . Breast cancer 08/28/12    Left Breast    PAST SURGICAL HISTORY: Past Surgical History  Procedure Date  . Foot fusion 3/09    ankle rt  . Dilation and curettage of uterus     Following Miscarriage  . Left breast needle core biopsy  07/26/12    UOQ - Ductal Carcinoma In Situ with Necrosis. Microcalcifications Identified  . Left breast lumpectomy 08/28/12  . Re-excision of left breast cancer on 09/10/12   . Re-excision of breast cancer,superior margins 10/30/2012    Procedure: RE-EXCISION OF BREAST CANCER,SUPERIOR MARGINS;  Surgeon: Sue Mention, MD;  Location: [facility];  Service: General;  Laterality: N/A;  left partial mastectomy with excision of margins    FAMILY HISTORY Family History  Problem Relation Age of Onset  . Cancer Mother     breast&blood  . Cancer Paternal Aunt     brain  The patient's father is alive at age 8. The patient's mother died at the age of 47 from metastatic breast cancer which was diagnosed when she was 28. The patient has 2 brothers and one sister. There is no other history of breast or ovarian cancer in the immediate family.   GYNECOLOGIC HISTORY:  Menarche age 6, first live birth age 71. Her periods are regular. She never used birth control pills.  SOCIAL HISTORY:  Sue [name] has a bachelor of science degree from [employer] in a food signs. Her husband Sue [name]. 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 Sue [name], currently 22-year-old, is also at home.   ADVANCED DIRECTIVES: not in place  HEALTH MAINTENANCE: History  Substance Use Topics  . Smoking status: Never Smoker   . Smokeless tobacco: Never Used  . Alcohol Use: Yes     Comment: very seldom     Colonoscopy:  PAP:  Bone density:  Lipid panel:  No Known Allergies  No current outpatient prescriptions on file.  OBJECTIVE: middle-aged African American woman who appears worry Filed Vitals:   11/26/12 0818  BP: 114/75  Pulse: 64  Temp: 97.5 F (36.4 C)  Resp: 20     Body mass index is 24.04 kg/(m^2).    ECOG FS: 0  Sclerae unicteric Oropharynx clear No cervical or supraclavicular adenopathy Lungs no rales or rhonchi Heart regular rate and rhythm Abd benign MSK no focal spinal  tenderness, no peripheral edema Neuro: nonfocal Breasts:  The right breast is unremarkable. The left breast is status post recent lumpectomy. The incision is healing nicely. There is minimal swelling, no erythema, no dehiscence. There is no unusual tenderness. The left axilla is benign.   LAB RESULTS: Lab Results  Component Value Date   WBC 4.4 10/29/2012   NEUTROABS 1.3* 09/20/2012   HGB 13.9 10/29/2012   HCT 40.8 10/29/2012   MCV 100.2* 10/29/2012   PLT 198 10/29/2012      Chemistry      Component Value Date/Time   NA 138 10/29/2012 0841   NA 141 09/20/2012 1554   K 3.4* 10/29/2012 0841   K 3.5 09/20/2012 1554   CL 103 10/29/2012 0841   CL 109* 09/20/2012 1554   CO2 24 10/29/2012 0841   CO2 27 09/20/2012 1554   BUN 12 10/29/2012 0841   BUN 13.0 09/20/2012 1554   CREATININE 0.79 10/29/2012 0841   CREATININE 0.8 09/20/2012 1554      Component Value Date/Time   CALCIUM 9.6 10/29/2012 0841   CALCIUM 9.6 09/20/2012 1554   ALKPHOS 60 09/20/2012 1554   AST 15 09/20/2012 1554   ALT 12 09/20/2012 1554   BILITOT 0.38 09/20/2012 1554       Lab Results  Component Value Date   LABCA2 8 09/20/2012    No components found with this basename: LABCA125    No results found for this basename: INR:1;PROTIME:1 in the last 168 hours  Urinalysis No results found for this basename: colorurine, appearanceur, labspec, phurine, glucoseu, hgbur, bilirubinur, ketonesur, proteinur, urobilinogen, nitrite, leukocytesur    STUDIES: No results found.  ASSESSMENT: 47 y.o. [place] woman status post left lumpectomy and sentinel lymph node sampling 08/28/2012 for a ductal carcinoma in situ, grade 3, estrogen receptor 100% positive, progesterone receptor negative, with 0 of 2 sentinel lymph nodes sampled involved  (1) there was an area of microinvasion measuring less than a millimeter, too scant for a prognostic panel   (2) reexcision for margin clearance 10/30/2012 was successful   PLAN: we spent  the better part of her hour-long visit going over her breast cancer in general and her own breast cancer in particular. She understands the noninvasive portion of her breast cancer, which is the major portion, is not life-threatening. She will need radiation to "cleanout" at the area in the breast, and she will benefit from antiestrogen therapy, namely tamoxifen, to further reduce the risk of local recurrence, and also to reduce the risk of a new breast cancer developing in either breast.  As far as the microinvasive tumor is concerned, the recurrence rate with local treatment only is less than 5% within 5 years. This clearly does not warrant chemotherapy. NCCN guidelines recommend consideration of antiestrogen therapy, and when the patient returns to see me in approximately 3 months we will discuss tamoxifen in detail. I did give her written information on that medication today.  Feiga's situation warrants genetic testing, and this has been tentatively scheduled for 11/30/2012. She also has a second degree relative near [place] with  early onset breast cancer and thyroid cancer. I have suggested she contact her to get the results of her genetic testing.   Marissa Lowrey C    11/26/2012

## 2012-11-26 NOTE — Progress Notes (Signed)
Checked in new pt with no financial concerns. °

## 2012-11-27 ENCOUNTER — Telehealth: Payer: Self-pay | Admitting: *Deleted

## 2012-11-27 NOTE — Telephone Encounter (Signed)
per orders no labs

## 2012-11-28 ENCOUNTER — Ambulatory Visit
Admission: RE | Admit: 2012-11-28 | Discharge: 2012-11-28 | Disposition: A | Discharge status: Home / Self Care | Payer: Medicaid Other | Source: Ambulatory Visit | Attending: Radiation Oncology | Admitting: Radiation Oncology

## 2012-11-28 DIAGNOSIS — C50419 Malignant neoplasm of upper-outer quadrant of unspecified female breast: Secondary | ICD-10-CM

## 2012-11-28 DIAGNOSIS — Y842 Radiological procedure and radiotherapy as the cause of abnormal reaction of the patient, or of later complication, without mention of misadventure at the time of the procedure: Secondary | ICD-10-CM | POA: Insufficient documentation

## 2012-11-28 DIAGNOSIS — L988 Other specified disorders of the skin and subcutaneous tissue: Secondary | ICD-10-CM | POA: Insufficient documentation

## 2012-11-28 DIAGNOSIS — C50919 Malignant neoplasm of unspecified site of unspecified female breast: Secondary | ICD-10-CM | POA: Insufficient documentation

## 2012-11-28 DIAGNOSIS — Z510 Encounter for antineoplastic radiation therapy: Secondary | ICD-10-CM | POA: Insufficient documentation

## 2012-11-28 NOTE — Progress Notes (Signed)
Simulation / Treatment Planning Note  The patient has a diagnosis of  T67micN0 (i+) left breast cancer; she is status post lumpectomy and sentinel node biopsy. She will receive whole breast radiotherapy with high tangents to cover axillary tissue. The patient was laid in the supine position on the treatment table with her arms over her head. Her head was in an Accuform device. I placed adhesive wiring over her lumpectomy scar and around the borders of her breast tissue . High-resolution CT axial imaging was obtained of the patient's chest. An isocenter was placed in her anterior left lung. Skin markings were made and she tolerated the procedure well without any complications.  The patient was then scanned in the CT simulator while holding her breath. I verified with our physicist that this increased the distance between her heart and chest wall. We will treat her with the breath-hold technique and base her planning on the breath-hold images.   Treatment planning note: the patient will be treated with opposed high tangential fields using MLCs for custom blocks. I plan to prescribe 50 Gray in 25 fractions to the whole breast, followed by a boost of 10 Gy in 5 fractions to the lumpectomy cavity.    3-dimensional conformal radiotherapy will be used in order to spare her lung tissue and heart from excessive dose while delivering adequate dose to her at-risk tissues. I have requested a DVH of the patient's lungs and heart as well as her lumpectomy cavity.  -----------------------------------  Lonie Peak, MD

## 2012-11-30 ENCOUNTER — Encounter: Payer: Self-pay | Admitting: Radiation Oncology

## 2012-11-30 ENCOUNTER — Other Ambulatory Visit: Payer: Medicaid Other | Admitting: Lab

## 2012-11-30 ENCOUNTER — Ambulatory Visit (HOSPITAL_BASED_OUTPATIENT_CLINIC_OR_DEPARTMENT_OTHER): Payer: Medicaid Other | Admitting: Genetic Counselor

## 2012-11-30 ENCOUNTER — Encounter: Payer: Self-pay | Admitting: Genetic Counselor

## 2012-11-30 DIAGNOSIS — D0590 Unspecified type of carcinoma in situ of unspecified breast: Secondary | ICD-10-CM

## 2012-11-30 DIAGNOSIS — Z803 Family history of malignant neoplasm of breast: Secondary | ICD-10-CM

## 2012-11-30 DIAGNOSIS — Z170 Estrogen receptor positive status [ER+]: Secondary | ICD-10-CM

## 2012-11-30 DIAGNOSIS — Z808 Family history of malignant neoplasm of other organs or systems: Secondary | ICD-10-CM

## 2012-11-30 DIAGNOSIS — C50419 Malignant neoplasm of upper-outer quadrant of unspecified female breast: Secondary | ICD-10-CM

## 2012-11-30 NOTE — Progress Notes (Signed)
Dr. Raymond Gurney Magrinat requested a consultation for genetic counseling and risk assessment for Sue [name], a 47 y.o. female, for discussion of her personal history of breast cancer and family history of breast and brain cancer. She presents to clinic today to discuss the possibility of a genetic predisposition to cancer, and to further clarify her risks, as well as her family members' risks for cancer.   HISTORY OF PRESENT ILLNESS: In 2013, at the age of 68, Sue [name] was diagnosed with ER+, PR- DCIS of the left breast. This is currently being treated with radiation.    Past Medical History  Diagnosis Date  . Wears glasses   . Breast cancer 08/28/12    Left Breast    Past Surgical History  Procedure Date  . Foot fusion 3/09    ankle rt  . Dilation and curettage of uterus     Following Miscarriage  . Left breast needle core biopsy 07/26/12    UOQ - Ductal Carcinoma In Situ with Necrosis. Microcalcifications Identified  . Left breast lumpectomy 08/28/12  . Re-excision of left breast cancer on 09/10/12   . Re-excision of breast cancer,superior margins 10/30/2012    Procedure: RE-EXCISION OF BREAST CANCER,SUPERIOR MARGINS;  Surgeon: [name], MD;  Location: [facility];  Service: General;  Laterality: N/A;  left partial mastectomy with excision of margins    History  Substance Use Topics  . Smoking status: Former Smoker    Types: Cigarettes    Quit date: 11/30/1993  . Smokeless tobacco: Never Used  . Alcohol Use: Yes     Comment: very seldom, 1x per week    REPRODUCTIVE HISTORY AND PERSONAL RISK ASSESSMENT FACTORS: Menarche was at age 64.   Premenopausal Uterus Intact: Yes Ovaries Intact: Yes G5P1A4 , first live birth at age 47  She has not previously undergone treatment for infertility.   OCP use for 2 years   She has not used HRT in the past.    FAMILY HISTORY:  We obtained a detailed, 4-generation family history.  Significant diagnoses are listed  below: Family History  Problem Relation Age of Onset  . Breast cancer Mother 50    blood cancer too  . Brain cancer Paternal Aunt     diagnosed in late 40s to early 30s  . Breast cancer Cousin     paternal cousin diagnosed; diagnosed in her late 43s  The patient was diagnosed with ER+ PR- DCIS breast cancer at age 69.  She has two full brothers and a maternal half sister.  Her sister died of HIV at age 43.  The patient's mother was diagnosed with breast cancer at age 5 and died at 89.  She had several brothers and sisters.  There is no other reported cancer history on the maternal side of the family.  The patient's father is alive at 88.  He has three sisters.  One sister died of a brain cancer in her late 57s.  She had 4 daughters, two who are fraternal twins.  One twin was diagnosed with breast cancer and thyroid cancer in her late 72s.  The patient' thinks that her twin was also diagnosed with breast cancer, but she is unsure.  These cousins had discussed genetic testing, but the patient is unsure if they ever had testing.  Patient's maternal ancestors are of African American descent, and paternal ancestors are of African American descent. There is no reported Ashkenazi Jewish ancestry. There is no  known consanguinity.  GENETIC COUNSELING RISK ASSESSMENT, DISCUSSION, AND SUGGESTED FOLLOW UP: We reviewed the natural history and genetic etiology of sporadic, familial and hereditary cancer syndromes.  About 5-10% of breast cancer is hereditary.  Of this, about 85% is the result of a BRCA1 or BRCA2 mutation.  We reviewed the red flags of hereditary cancer syndromes and the dominant inheritance patterns. During the discussion the patient was quite tearful talking about her family members who were deceased.  We discussed that she meets medical criteria for genetic testing, but that [place] Medicaid does not pay for testing.  A review of her options, including a referral to [facility] for protein truncation testing,  were discussed.  The patient would like a referral to [facility] for genetic testing.    The patient's personal history of breast cancer and family history of breast and brain cancer is suggestive of the following possible diagnosis: hereditary cancer syndrome  We discussed that identification of a hereditary cancer syndrome may help her care providers tailor the patients medical management. If a mutation indicating a hereditary cancer syndrome is detected in this case, the [employer] recommendations would include increased cancer surveillance and possible prophylactic surgery. If a mutation is detected, the patient will be referred back to the referring provider and to any additional appropriate care providers to discuss the relevant options.   If a mutation is not found in the patient, this will decrease the likelihood of a hereditary cancer syndrome as the explanation for her breast cancer. Cancer surveillance options would be discussed for the patient according to the appropriate standard National Comprehensive Cancer Network and American Cancer Society guidelines, with consideration of their personal and family history risk factors. In this case, the patient will be referred back to their care providers for discussions of management.   In order to estimate her chance of having a BRCA mutation, we used statistical models (Penn II) and laboratory data that take into account her personal medical history, family history and ancestry.  Because each model is different, there can be a lot of variability in the risks they give.  Therefore, these numbers must be considered a rough range and not a precise risk of having a BRCA mutation.  This model estimates that she has approximately a 11-12% chance of having a mutation. Based on this assessment of her family and personal history, genetic testing is recommended.  After considering the risks, benefits, and limitations, the patient provided  informed consent for  the following: Referral for genetic testing through [facility].   Per the patient's request, we will contact [facility] by telephone to refer for genetic counseling and testing. A follow up genetic counseling visit will be scheduled if indicated.  The patient was seen for a total of 45 minutes, greater than 50% of which was spent face-to-face counseling.  This plan is being carried out per Dr. Raymond Gurney Magrinat's recommendations.  This note will also be sent to the referring provider via the electronic medical record. The patient will be supplied with a summary of this genetic counseling discussion as well as educational information on the discussed hereditary cancer syndromes following the conclusion of their visit.   Patient was discussed with Dr. Drue Second.  _______________________________________________________________________ For Office Staff:  Number of people involved in session: 2 Was an Intern/ student involved with case: no

## 2012-12-01 ENCOUNTER — Telehealth: Payer: Self-pay | Admitting: *Deleted

## 2012-12-01 NOTE — Telephone Encounter (Signed)
Left message for pt to return my call so I can schedule genetic appt.

## 2012-12-03 ENCOUNTER — Telehealth: Payer: Self-pay | Admitting: *Deleted

## 2012-12-03 NOTE — Telephone Encounter (Signed)
Left message for pt to call Dawn to schedule her genetic appt.

## 2012-12-04 ENCOUNTER — Encounter (INDEPENDENT_AMBULATORY_CARE_PROVIDER_SITE_OTHER): Payer: Medicaid Other | Admitting: General Surgery

## 2012-12-05 ENCOUNTER — Ambulatory Visit: Payer: Medicaid Other | Admitting: Radiation Oncology

## 2012-12-06 ENCOUNTER — Encounter: Payer: Self-pay | Admitting: Radiation Oncology

## 2012-12-06 ENCOUNTER — Ambulatory Visit
Admission: RE | Admit: 2012-12-06 | Discharge: 2012-12-06 | Disposition: A | Discharge status: Home / Self Care | Payer: Medicaid Other | Source: Ambulatory Visit | Attending: Radiation Oncology | Admitting: Radiation Oncology

## 2012-12-06 NOTE — Progress Notes (Signed)
Simulation Verification Note- L breast  The patient was brought to the treatment unit and placed in the planned treatment position. The clinical setup was verified. Then port films were obtained and uploaded to the radiation oncology medical record software.  The treatment beams were carefully compared against the planned radiation fields. The position location and shape of the radiation fields was reviewed. They targeted volume of tissue appears to be appropriately covered by the radiation beams. Organs at risk appear to be excluded as planned.  Based on my personal review, I approved the simulation verification. The patient's treatment will proceed as planned.  -----------------------------------  Lonie Peak, MD

## 2012-12-07 ENCOUNTER — Ambulatory Visit
Admission: RE | Admit: 2012-12-07 | Discharge: 2012-12-07 | Disposition: A | Discharge status: Home / Self Care | Payer: Medicaid Other | Source: Ambulatory Visit | Attending: Radiation Oncology | Admitting: Radiation Oncology

## 2012-12-10 ENCOUNTER — Encounter: Payer: Self-pay | Admitting: Radiation Oncology

## 2012-12-10 ENCOUNTER — Ambulatory Visit
Admission: RE | Admit: 2012-12-10 | Discharge: 2012-12-10 | Disposition: A | Discharge status: Home / Self Care | Payer: Medicaid Other | Source: Ambulatory Visit | Attending: Radiation Oncology | Admitting: Radiation Oncology

## 2012-12-10 VITALS — BP 101/65 | HR 67 | Temp 98.1°F | Resp 20 | Wt 158.8 lb

## 2012-12-10 DIAGNOSIS — C50419 Malignant neoplasm of upper-outer quadrant of unspecified female breast: Secondary | ICD-10-CM

## 2012-12-10 MED ORDER — RADIAPLEXRX EX GEL
Freq: Once | CUTANEOUS | Status: AC
  Administered 2012-12-10: 13:00:00 via TOPICAL

## 2012-12-10 MED ORDER — ALRA NON-METALLIC DEODORANT (RAD-ONC)
1.0000 "application " | Freq: Once | TOPICAL | Status: AC
  Administered 2012-12-10: 1 via TOPICAL

## 2012-12-10 NOTE — Progress Notes (Signed)
Post sim ed completed, charted under post sim ed appt. Pt denies pain, fatigue, loss of appetite.

## 2012-12-10 NOTE — Progress Notes (Signed)
Post sim ed completed w/pt. Gave her "Radiation and You" booklet w/all pertinent information marked and discussed, re: fatigue, skin irritation/care, nutrition, pain. Gave pt Radiaplex, Alra deodorant w/instrucitons for proper use. Pt verbalized understanding. All questions answered. Marland Kitchen

## 2012-12-10 NOTE — Progress Notes (Signed)
   Weekly Management Note:  outpatient Current Dose:  6 Gy  Projected Dose: 60 Gy   Narrative:  The patient presents for routine under treatment assessment.  CBCT/MVCT images/Port film x-rays were reviewed.  The chart was checked. No complaints  Physical Findings:  weight is 158 lb 12.8 oz (72.031 kg). Her oral temperature is 98.1 F (36.7 C). Her blood pressure is 101/65 and her pulse is 67. Her respiration is 20.  no skin irritation thus far over the left breast  Impression:  The patient is tolerating radiotherapy.  Plan:  Continue radiotherapy as planned.  ________________________________   Lonie Peak, M.D.

## 2012-12-10 NOTE — Addendum Note (Signed)
Encounter addended by: Glennie Hawk, RN on: 12/10/2012  1:03 PM<BR>     Documentation filed: Inpatient Patient Education, Inpatient MAR, Orders

## 2012-12-11 ENCOUNTER — Ambulatory Visit
Admission: RE | Admit: 2012-12-11 | Discharge: 2012-12-11 | Disposition: A | Discharge status: Home / Self Care | Payer: Medicaid Other | Source: Ambulatory Visit | Attending: Radiation Oncology | Admitting: Radiation Oncology

## 2012-12-12 ENCOUNTER — Ambulatory Visit: Payer: Medicaid Other

## 2012-12-13 ENCOUNTER — Ambulatory Visit
Admission: RE | Admit: 2012-12-13 | Discharge: 2012-12-13 | Disposition: A | Discharge status: Home / Self Care | Payer: Medicaid Other | Source: Ambulatory Visit | Attending: Radiation Oncology | Admitting: Radiation Oncology

## 2012-12-14 ENCOUNTER — Ambulatory Visit
Admission: RE | Admit: 2012-12-14 | Discharge: 2012-12-14 | Disposition: A | Discharge status: Home / Self Care | Payer: Medicaid Other | Source: Ambulatory Visit | Attending: Radiation Oncology | Admitting: Radiation Oncology

## 2012-12-17 ENCOUNTER — Ambulatory Visit
Admission: RE | Admit: 2012-12-17 | Discharge: 2012-12-17 | Disposition: A | Discharge status: Home / Self Care | Payer: Medicaid Other | Source: Ambulatory Visit | Attending: Radiation Oncology | Admitting: Radiation Oncology

## 2012-12-17 ENCOUNTER — Encounter: Payer: Self-pay | Admitting: Radiation Oncology

## 2012-12-17 VITALS — BP 110/61 | HR 67 | Temp 97.8°F | Resp 20 | Wt 159.6 lb

## 2012-12-17 DIAGNOSIS — C50419 Malignant neoplasm of upper-outer quadrant of unspecified female breast: Secondary | ICD-10-CM

## 2012-12-17 NOTE — Progress Notes (Signed)
   Weekly Management Note:  outpatient Current Dose:  14 Gy  Projected Dose: 60 Gy   Narrative:  The patient presents for routine under treatment assessment.  CBCT/MVCT images/Port film x-rays were reviewed.  The chart was checked. Doing well. Occasional shooting pains in her left breast. Applying radiaplex twice a day. Energy is good   Physical Findings:  weight is 159 lb 9.6 oz (72.394 kg). Her oral temperature is 97.8 F (36.6 C). Her blood pressure is 110/61 and her pulse is 67. Her respiration is 20.  left breast is slightly hyperpigmented. Skin intact.  Impression:  The patient is tolerating radiotherapy.  Plan:  Continue radiotherapy as planned.  ________________________________   Lonie Peak, M.D.

## 2012-12-17 NOTE — Progress Notes (Signed)
Pt applying Radiaplex to left breast bid, denies fatigue, loss of appetite.

## 2012-12-18 ENCOUNTER — Ambulatory Visit
Admission: RE | Admit: 2012-12-18 | Discharge: 2012-12-18 | Disposition: A | Discharge status: Home / Self Care | Payer: Medicaid Other | Source: Ambulatory Visit | Attending: Radiation Oncology | Admitting: Radiation Oncology

## 2012-12-19 ENCOUNTER — Ambulatory Visit
Admission: RE | Admit: 2012-12-19 | Discharge: 2012-12-19 | Disposition: A | Discharge status: Home / Self Care | Payer: Medicaid Other | Source: Ambulatory Visit | Attending: Radiation Oncology | Admitting: Radiation Oncology

## 2012-12-20 ENCOUNTER — Ambulatory Visit
Admission: RE | Admit: 2012-12-20 | Discharge: 2012-12-20 | Disposition: A | Discharge status: Home / Self Care | Payer: Medicaid Other | Source: Ambulatory Visit | Attending: Radiation Oncology | Admitting: Radiation Oncology

## 2012-12-21 ENCOUNTER — Ambulatory Visit
Admission: RE | Admit: 2012-12-21 | Discharge: 2012-12-21 | Disposition: A | Discharge status: Home / Self Care | Payer: Medicaid Other | Source: Ambulatory Visit | Attending: Radiation Oncology | Admitting: Radiation Oncology

## 2012-12-22 ENCOUNTER — Encounter: Payer: Self-pay | Admitting: *Deleted

## 2012-12-22 NOTE — Progress Notes (Signed)
Mailed after appt letter to pt. 

## 2012-12-24 ENCOUNTER — Ambulatory Visit
Admission: RE | Admit: 2012-12-24 | Discharge: 2012-12-24 | Disposition: A | Discharge status: Home / Self Care | Payer: Medicaid Other | Source: Ambulatory Visit | Attending: Radiation Oncology | Admitting: Radiation Oncology

## 2012-12-24 VITALS — BP 109/64 | HR 67 | Temp 98.6°F | Wt 157.1 lb

## 2012-12-24 DIAGNOSIS — C50419 Malignant neoplasm of upper-outer quadrant of unspecified female breast: Secondary | ICD-10-CM

## 2012-12-24 NOTE — Progress Notes (Signed)
Sue [name] has received 12 fractions to her left breast.  Denies any pain, but note mild swelling of breast and hyperpigmentation.  Skin remains intact and soft to touch.  Using Radiaplex Gel BID to left breast.

## 2012-12-24 NOTE — Progress Notes (Signed)
   Weekly Management Note:  outpatient Current Dose:  24 Gy  Projected Dose: 60 Gy   Narrative:  The patient presents for routine under treatment assessment.  CBCT/MVCT images/Port film x-rays were reviewed.  The chart was checked. She is doing well.   Physical Findings:  weight is 157 lb 1.6 oz (71.26 kg). Her temperature is 98.6 F (37 C). Her blood pressure is 109/64 and her pulse is 67.  left breast is hyperpigmented, skin intact  Impression:  The patient is tolerating radiotherapy.  Plan:  Continue radiotherapy as planned. She asked about mammograms. She received a letter encouraging her to schedule her yearly mammogram. I told her that it will be best for [place] to give her about 2 months to heal from radiotherapy before she schedules her mammography, to allow her to heal from treatment.  ________________________________   Sue [name], M.D.

## 2012-12-25 ENCOUNTER — Ambulatory Visit
Admission: RE | Admit: 2012-12-25 | Discharge: 2012-12-25 | Disposition: A | Discharge status: Home / Self Care | Payer: Medicaid Other | Source: Ambulatory Visit | Attending: Radiation Oncology | Admitting: Radiation Oncology

## 2012-12-26 ENCOUNTER — Ambulatory Visit
Admission: RE | Admit: 2012-12-26 | Discharge: 2012-12-26 | Disposition: A | Discharge status: Home / Self Care | Payer: Medicaid Other | Source: Ambulatory Visit | Attending: Radiation Oncology | Admitting: Radiation Oncology

## 2012-12-27 ENCOUNTER — Ambulatory Visit: Payer: Medicaid Other

## 2012-12-28 ENCOUNTER — Ambulatory Visit: Payer: Medicaid Other

## 2012-12-29 ENCOUNTER — Other Ambulatory Visit: Payer: Self-pay

## 2012-12-31 ENCOUNTER — Ambulatory Visit
Admission: RE | Admit: 2012-12-31 | Discharge: 2012-12-31 | Disposition: A | Discharge status: Home / Self Care | Payer: Medicaid Other | Source: Ambulatory Visit | Attending: Radiation Oncology | Admitting: Radiation Oncology

## 2012-12-31 ENCOUNTER — Encounter: Payer: Self-pay | Admitting: Radiation Oncology

## 2012-12-31 VITALS — BP 103/67 | HR 63 | Temp 97.9°F | Resp 20 | Wt 156.9 lb

## 2012-12-31 DIAGNOSIS — C50419 Malignant neoplasm of upper-outer quadrant of unspecified female breast: Secondary | ICD-10-CM

## 2012-12-31 NOTE — Progress Notes (Signed)
   Weekly Management Note:  outpatient Current Dose:  30 Gy  Projected Dose: 60 Gy   Narrative:  The patient presents for routine under treatment assessment.  CBCT/MVCT images/Port film x-rays were reviewed.  The chart was checked.  Physical Findings:  weight is 156 lb 14.4 oz (71.169 kg). Her oral temperature is 97.9 F (36.6 C). Her blood pressure is 103/67 and her pulse is 63. Her respiration is 20.  She is doing well. She reports some itching along the lateral aspect of her left breast. This is alleviated by radiaplex  Impression:  The patient is tolerating radiotherapy. Diffuse hyperpigmentation through the left breast. Skin intact  Plan:  Continue radiotherapy as planned. I told the patient to go ahead and apply 1% hydrocortisone cream along the lateral aspect of the breast as needed for itching  ________________________________   Lonie Peak, M.D.

## 2012-12-31 NOTE — Progress Notes (Signed)
Pt denies pain, fatigue, loss of appetite. She is applying Radiaplex to left breast tx area. She states she has darkening/tanning of skin in tx area. Pt had some itchiness over weekend, but states Radiaplex helpful. She has Cortisone to apply if she has more itching.

## 2013-01-01 ENCOUNTER — Ambulatory Visit
Admission: RE | Admit: 2013-01-01 | Discharge: 2013-01-01 | Disposition: A | Discharge status: Home / Self Care | Payer: Medicaid Other | Source: Ambulatory Visit | Attending: Radiation Oncology | Admitting: Radiation Oncology

## 2013-01-02 ENCOUNTER — Ambulatory Visit
Admission: RE | Admit: 2013-01-02 | Discharge: 2013-01-02 | Disposition: A | Discharge status: Home / Self Care | Payer: Medicaid Other | Source: Ambulatory Visit | Attending: Radiation Oncology | Admitting: Radiation Oncology

## 2013-01-03 ENCOUNTER — Ambulatory Visit
Admission: RE | Admit: 2013-01-03 | Discharge: 2013-01-03 | Disposition: A | Discharge status: Home / Self Care | Payer: Medicaid Other | Source: Ambulatory Visit | Attending: Radiation Oncology | Admitting: Radiation Oncology

## 2013-01-04 ENCOUNTER — Ambulatory Visit
Admission: RE | Admit: 2013-01-04 | Discharge: 2013-01-04 | Disposition: A | Discharge status: Home / Self Care | Payer: Medicaid Other | Source: Ambulatory Visit | Attending: Radiation Oncology | Admitting: Radiation Oncology

## 2013-01-07 ENCOUNTER — Ambulatory Visit
Admission: RE | Admit: 2013-01-07 | Discharge: 2013-01-07 | Disposition: A | Discharge status: Home / Self Care | Payer: Medicaid Other | Source: Ambulatory Visit | Attending: Radiation Oncology | Admitting: Radiation Oncology

## 2013-01-08 ENCOUNTER — Encounter: Payer: Self-pay | Admitting: Radiation Oncology

## 2013-01-08 ENCOUNTER — Ambulatory Visit
Admission: RE | Admit: 2013-01-08 | Discharge: 2013-01-08 | Disposition: A | Discharge status: Home / Self Care | Payer: Medicaid Other | Source: Ambulatory Visit | Attending: Radiation Oncology | Admitting: Radiation Oncology

## 2013-01-08 VITALS — BP 108/66 | HR 62 | Temp 98.6°F | Resp 20 | Wt 158.7 lb

## 2013-01-08 NOTE — Progress Notes (Signed)
Pt c/o tenderness, soreness of left breast, itchiness. Advised she can apply Cortisone cream for itchiness. Applying Radiaplex for hyperpigmentation. Denies fatigue, loss of appetite.

## 2013-01-08 NOTE — Progress Notes (Signed)
   Weekly Management Note:  outpatient Current Dose:  42 Gy  Projected Dose: 60 Gy   Narrative:  The patient presents for routine under treatment assessment.  CBCT/MVCT images/Port film x-rays were reviewed.  The chart was checked. She reports some itching and tenderness of the left breast. Applying radiaplex and cortisone  Physical Findings:  weight is 158 lb 11.2 oz (71.986 kg). Her oral temperature is 98.6 F (37 C). Her blood pressure is 108/66 and her pulse is 62. Her respiration is 20.  diffuse hyperpigmentation to the left breast. Skin is dry but intact  Impression:  The patient is tolerating radiotherapy.  Plan:  Continue radiotherapy as planned. Continue radiaplex 3 times a day, 1% hydrocortisone cream at areas of itching  ________________________________   Lonie Peak, M.D.

## 2013-01-09 ENCOUNTER — Ambulatory Visit
Admission: RE | Admit: 2013-01-09 | Discharge: 2013-01-09 | Disposition: A | Discharge status: Home / Self Care | Payer: Medicaid Other | Source: Ambulatory Visit | Attending: Radiation Oncology | Admitting: Radiation Oncology

## 2013-01-09 ENCOUNTER — Encounter: Payer: Self-pay | Admitting: Radiation Oncology

## 2013-01-10 ENCOUNTER — Ambulatory Visit
Admission: RE | Admit: 2013-01-10 | Discharge: 2013-01-10 | Disposition: A | Discharge status: Home / Self Care | Payer: Medicaid Other | Source: Ambulatory Visit | Attending: Radiation Oncology | Admitting: Radiation Oncology

## 2013-01-11 ENCOUNTER — Ambulatory Visit
Admission: RE | Admit: 2013-01-11 | Discharge: 2013-01-11 | Disposition: A | Discharge status: Home / Self Care | Payer: Medicaid Other | Source: Ambulatory Visit | Attending: Radiation Oncology | Admitting: Radiation Oncology

## 2013-01-11 ENCOUNTER — Encounter: Payer: Self-pay | Admitting: Radiation Oncology

## 2013-01-12 DIAGNOSIS — Z79810 Long term (current) use of selective estrogen receptor modulators (SERMs): Secondary | ICD-10-CM

## 2013-01-12 HISTORY — DX: Long term (current) use of selective estrogen receptor modulators (serms): Z79.810

## 2013-01-14 ENCOUNTER — Ambulatory Visit
Admission: RE | Admit: 2013-01-14 | Discharge: 2013-01-14 | Disposition: A | Discharge status: Home / Self Care | Payer: Medicaid Other | Source: Ambulatory Visit | Attending: Radiation Oncology | Admitting: Radiation Oncology

## 2013-01-14 ENCOUNTER — Encounter: Payer: Self-pay | Admitting: Radiation Oncology

## 2013-01-14 VITALS — BP 100/63 | HR 67 | Temp 98.1°F | Resp 20 | Wt 158.9 lb

## 2013-01-14 NOTE — Progress Notes (Signed)
   Weekly Management Note:  outpatient Current Dose:  50 Gy  Projected Dose: 60 Gy   Narrative:  The patient presents for routine under treatment assessment.  CBCT/MVCT images/Port film x-rays were reviewed.  The chart was checked. Doing well. She does have some itching and tenderness over her left breast. Using hydrocortisone cream and radiaplex  Physical Findings:  weight is 158 lb 14.4 oz (72.077 kg). Her oral temperature is 98.1 F (36.7 C). Her blood pressure is 100/63 and her pulse is 67. Her respiration is 20.  diffuse hyperpigmentation over the left breast. Skin is dry, patches of early dry desquamation. No moist desquamation.  Impression:  The patient is tolerating radiotherapy.  Plan:  Continue radiotherapy as planned. Continue hydrocortisone and radiaplex. Patient would like to start swimming, but I told her to wait until her skin irritation has resolved  ________________________________   Lonie Peak, M.D.

## 2013-01-14 NOTE — Progress Notes (Signed)
patient here weeklly rad tx left breast,23/30 completed, hyperpigmentation on left breast, using radiaplex gel, itching, and tenderness ,radiplex gel tid and cortisone cream 9:11 AM

## 2013-01-15 ENCOUNTER — Ambulatory Visit: Payer: Medicaid Other

## 2013-01-16 ENCOUNTER — Ambulatory Visit
Admission: RE | Admit: 2013-01-16 | Discharge: 2013-01-16 | Disposition: A | Discharge status: Home / Self Care | Payer: Medicaid Other | Source: Ambulatory Visit | Attending: Radiation Oncology | Admitting: Radiation Oncology

## 2013-01-17 ENCOUNTER — Ambulatory Visit
Admission: RE | Admit: 2013-01-17 | Discharge: 2013-01-17 | Disposition: A | Discharge status: Home / Self Care | Payer: Medicaid Other | Source: Ambulatory Visit | Attending: Radiation Oncology | Admitting: Radiation Oncology

## 2013-01-17 ENCOUNTER — Ambulatory Visit: Payer: Medicaid Other

## 2013-01-18 ENCOUNTER — Ambulatory Visit: Payer: Medicaid Other

## 2013-01-21 ENCOUNTER — Ambulatory Visit
Admission: RE | Admit: 2013-01-21 | Discharge: 2013-01-21 | Disposition: A | Discharge status: Home / Self Care | Payer: Medicaid Other | Source: Ambulatory Visit | Attending: Radiation Oncology | Admitting: Radiation Oncology

## 2013-01-21 ENCOUNTER — Ambulatory Visit: Payer: Medicaid Other

## 2013-01-21 ENCOUNTER — Encounter: Payer: Self-pay | Admitting: Radiation Oncology

## 2013-01-21 VITALS — BP 103/76 | HR 66 | Temp 98.0°F | Resp 20 | Wt 155.5 lb

## 2013-01-21 MED ORDER — RADIAPLEXRX EX GEL
Freq: Once | CUTANEOUS | Status: AC
  Administered 2013-01-21: 10:00:00 via TOPICAL

## 2013-01-21 NOTE — Progress Notes (Signed)
Pt denies pain, fatigue, loss of appetite. She is applying Radiaplex to left breast for hyperpigmentation. She states her "skin comes off on the washcloth when she bathes". She states it is not itching any longer.

## 2013-01-21 NOTE — Progress Notes (Signed)
   Weekly Management Note:  Out patient Current Dose:  56 Gy  Projected Dose: 60 Gy   Narrative:  The patient presents for routine under treatment assessment.  CBCT/MVCT images/Port film x-rays were reviewed.  The chart was checked. Doing well. She denies pain. Itching has improved. Her energy is good. She is applying radiaplex of left breast. She has noticed desquamation when she cleans herself with a washcloth  Physical Findings:  weight is 155 lb 8 oz (70.534 kg). Her oral temperature is 98 F (36.7 C). Her blood pressure is 103/76 and her pulse is 66. Her respiration is 20.  epidermal skin is regenerating over the left breast, with interval resolution of hyperpigmentation. Skin is dry  Impression:  The patient is tolerating radiotherapy.  Plan:  Continue radiotherapy as planned. Followup in one month  ________________________________   Lonie Peak, M.D.

## 2013-01-22 ENCOUNTER — Ambulatory Visit
Admission: RE | Admit: 2013-01-22 | Discharge: 2013-01-22 | Disposition: A | Discharge status: Home / Self Care | Payer: Medicaid Other | Source: Ambulatory Visit | Attending: Radiation Oncology | Admitting: Radiation Oncology

## 2013-01-23 ENCOUNTER — Ambulatory Visit
Admission: RE | Admit: 2013-01-23 | Discharge: 2013-01-23 | Disposition: A | Discharge status: Home / Self Care | Payer: Medicaid Other | Source: Ambulatory Visit | Attending: Radiation Oncology | Admitting: Radiation Oncology

## 2013-01-23 ENCOUNTER — Encounter: Payer: Self-pay | Admitting: Radiation Oncology

## 2013-01-24 ENCOUNTER — Ambulatory Visit (HOSPITAL_BASED_OUTPATIENT_CLINIC_OR_DEPARTMENT_OTHER): Payer: Medicaid Other | Admitting: Oncology

## 2013-01-24 ENCOUNTER — Other Ambulatory Visit: Payer: Medicaid Other | Admitting: Lab

## 2013-01-24 VITALS — BP 126/84 | HR 65 | Temp 97.9°F | Resp 20 | Ht 69.0 in | Wt 152.5 lb

## 2013-01-24 DIAGNOSIS — C50412 Malignant neoplasm of upper-outer quadrant of left female breast: Secondary | ICD-10-CM

## 2013-01-24 DIAGNOSIS — C50919 Malignant neoplasm of unspecified site of unspecified female breast: Secondary | ICD-10-CM

## 2013-01-24 MED ORDER — TAMOXIFEN CITRATE 20 MG PO TABS: 20.0000 mg | ORAL_TABLET | Freq: Every day | ORAL | Status: DC

## 2013-01-24 NOTE — Progress Notes (Signed)
ID: [name]   DOB: 07-Dec-1965  MR#: [id number]  CSN#:[id number]  PCP: [name], MD GYN:  SUClaud Kelp OTHER MD: Lonie Peak   HISTORY OF PRESENT ILLNESS: She had a mammogram in January 2013 which revealed some calcifications in the left breast. Spot compression views suggested a benign etiology but a six-month followup was recommended. Followup mammogram September 2013 showed suspicious calcifications biopsy is recommended. 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 MRI scan performed 07/31/2012 showed an area of enhancement consistent DCIS with clip marker present.  Lumpectomy 08/28/2012 was performed. This revealed multiple and close it involved margins. 2 sentinel lymph nodes were removed both of which were negative for malignancy, but one does have isolated tumor cells seen in the subcapsular location. In addition a small tiny focus of microinvasive tumor was felt to be seen as well.  The patient underwent reexcision on 09/10/2012, additional DCIS was seen adjacent to the left medial and lateral margin. Her subsequent history is as detailed below  INTERVAL HISTORY: Urvi returns today for followup of her breast cancer. Since her last visit here she met with the genetics counselor and has the information to make an appointment at [facility] for further testing. She also completed her radiation treatments.  REVIEW OF SYSTEMS: She did well with the radiation, with minimal fatigue, and some dry desquamation. There is a little bit of tightness in the left shoulder but otherwise a detailed review of systems today was entirely negative.  PAST MEDICAL HISTORY: Past Medical History  Diagnosis Date  . Wears glasses   . Breast cancer 08/28/12    Left Breast    PAST SURGICAL HISTORY: Past Surgical History  Procedure Laterality Date  . Foot fusion  3/09    ankle rt  . Dilation and curettage of uterus      Following Miscarriage   . Left breast needle core biopsy  07/26/12    UOQ - Ductal Carcinoma In Situ with Necrosis. Microcalcifications Identified  . Left breast lumpectomy  08/28/12  . Re-excision of left breast cancer on 09/10/12    . Re-excision of breast cancer,superior margins  10/30/2012    Procedure: RE-EXCISION OF BREAST CANCER,SUPERIOR MARGINS;  Surgeon: [name], MD;  Location: [facility];  Service: General;  Laterality: N/A;  left partial mastectomy with excision of margins    FAMILY HISTORY Family History  Problem Relation Age of Onset  . Breast cancer Mother 50    blood cancer too  . Brain cancer Paternal Aunt     diagnosed in late 50s to early 73s  . Breast cancer Cousin     paternal cousin diagnosed; diagnosed in her late 41s  The patient's father is alive at age 80. The patient's mother died at the age of 35 from metastatic breast cancer which was diagnosed when she was 57. The patient has 2 brothers and one sister. There is no other history of breast or ovarian cancer in the immediate family.   GYNECOLOGIC HISTORY:  Menarche age 66, first live birth age 22. Her periods are regular. She never used birth control pills.  SOCIAL HISTORY: Tzippy has a bachelor of science degree from [employer] in food science. Her husband [name]. Madilyn Fireman is in Airline pilot for [employer]. Daughter Neill Loft, currently 35-year-old, is also at home.   ADVANCED DIRECTIVES: not in place  HEALTH MAINTENANCE: History  Substance Use Topics  . Smoking status: Former Smoker  Types: Cigarettes    Quit date: 11/30/1993  . Smokeless tobacco: Never Used  . Alcohol Use: Yes     Comment: very seldom, 1x per week     Colonoscopy:  PAP:  Bone density:  Lipid panel:  No Known Allergies  Current Outpatient Prescriptions  Medication Sig Dispense Refill  . hyaluronate sodium (RADIAPLEXRX) GEL Apply topically 2 (two) times daily.      . non-metallic deodorant Thornton Papas) MISC Apply 1 application topically daily as  needed.       No current facility-administered medications for this visit.    OBJECTIVE: middle-aged [place] American woman who appears well Filed Vitals:   01/24/13 0850  BP: 126/84  Pulse: 65  Temp: 97.9 F (36.6 C)  Resp: 20     Body mass index is 22.51 kg/(m^2).    ECOG FS: 0  Sclerae unicteric Oropharynx clear No cervical or supraclavicular adenopathy Lungs no rales or rhonchi Heart regular rate and rhythm Abd benign MSK no focal spinal tenderness, no peripheral edema Neuro: nonfocal Breasts:  The right breast is unremarkable. The left breast is status post radiation. There is dry desquamation and hyperpigmentation. There is no erythema or swelling or unusual tenderness. There is no evidence of local recurrence the The left axilla is benign.   LAB RESULTS: Lab Results  Component Value Date   WBC 4.4 10/29/2012   NEUTROABS 1.3* 09/20/2012   HGB 13.9 10/29/2012   HCT 40.8 10/29/2012   MCV 100.2* 10/29/2012   PLT 198 10/29/2012      Chemistry      Component Value Date/Time   NA 138 10/29/2012 0841   NA 141 09/20/2012 1554   K 3.4* 10/29/2012 0841   K 3.5 09/20/2012 1554   CL 103 10/29/2012 0841   CL 109* 09/20/2012 1554   CO2 24 10/29/2012 0841   CO2 27 09/20/2012 1554   BUN 12 10/29/2012 0841   BUN 13.0 09/20/2012 1554   CREATININE 0.79 10/29/2012 0841   CREATININE 0.8 09/20/2012 1554      Component Value Date/Time   CALCIUM 9.6 10/29/2012 0841   CALCIUM 9.6 09/20/2012 1554   ALKPHOS 60 09/20/2012 1554   AST 15 09/20/2012 1554   ALT 12 09/20/2012 1554   BILITOT 0.38 09/20/2012 1554       Lab Results  Component Value Date   LABCA2 8 09/20/2012    No components found with this basename: LABCA125    No results found for this basename: INR,  in the last 168 hours  Urinalysis No results found for this basename: colorurine,  appearanceur,  labspec,  phurine,  glucoseu,  hgbur,  bilirubinur,  ketonesur,  proteinur,  urobilinogen,  nitrite,  leukocytesur     STUDIES: No results found.   ASSESSMENT: 47 y.o. [place] woman status post left lumpectomy and sentinel lymph node sampling 08/28/2012 for a ductal carcinoma in situ, grade 3, estrogen receptor 100% positive, progesterone receptor negative, with 0 of 2 sentinel lymph nodes sampled involved  (1) there was an area of microinvasion measuring less than a millimeter, too scant for a prognostic panel   (2) reexcision for margin clearance 10/30/2012 was successful   (3) completed adjuvant radiation 01/23/2013  (4) starting tamoxifen March 2014  (5) genetic testing at [facility] pending  PLAN: Carlin did well with her radiation and she is now ready to consider antiestrogen. She understands that as far as her prior cancers, the benefit of tamoxifen would be minimal. On the other hand from a  preventive point of view tamoxifen can make a significant contribution since she has approximately a 1% per year chance of developing a new breast cancer and she is a young woman.  She is not using any contraception at present. She understands that tamoxifen is noncontraceptive, that she can get pregnant while on tamoxifen, and that we do not know how safe it is but suspect it is unsafe for the baby for the mother to be on tamoxifen. Accordingly I said to her is if she is not going to use contraception she should not start tamoxifen.  She wants very much to start the tamoxifen, and she will discuss contraception with her husband. She is aware of the possible toxicities, side effects and complications and I gave her that information today in writing. She's been a see [place] again in 3 months just to make sure she is tolerating it well, and then she will see me again in January of 2015 after her next mammogram.. A fall goes well then we will start seeing her yearly. She knows to call for any problems that may develop before the next visit.  MAGRINAT,GUSTAV C    01/24/2013

## 2013-01-30 ENCOUNTER — Telehealth: Payer: Self-pay | Admitting: *Deleted

## 2013-01-30 NOTE — Progress Notes (Signed)
  Radiation Oncology         [phone]) [phone] ________________________________  Name: Sue [name] MRN: [id number]  Date: 11/30/2012  DOB: 1966-07-06  DATE OF SERVICE 11/28/12  RESPIRATORY MOTION MANAGEMENT SIMULATION  NARRATIVE:  In order to account for effect of respiratory motion on target structures and other organs in the planning and delivery of radiotherapy, this patient underwent respiratory motion management simulation.  To accomplish this, when the patient was brought to the CT simulation planning suite, 4D respiratory motion management CT images were obtained.  The CT images were loaded into the planning software.  Avoidance structures were contoured.  It was determined that the breath-hold technique is appropriate to spare her heart from excessive dose.  ________________________________  Lonie Peak, MD

## 2013-01-30 NOTE — Progress Notes (Addendum)
  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  She developed hyperpigmentation and dry desquamation  Plan: The patient has completed radiation treatment. The patient will return to radiation oncology clinic for routine followup in one month. I advised them to call or return sooner if they have any questions or concerns related to their recovery or treatment.  -----------------------------------  Lonie Peak, MD

## 2013-01-30 NOTE — Telephone Encounter (Signed)
lm appts d/t was given for June,2014 and Jan, 2015.

## 2013-01-30 NOTE — Progress Notes (Signed)
Name: Sue [name]   MRN: [id number]  Date:  01/09/2013    DOB: 08/22/1966  Status:outpatient    DIAGNOSIS: Breast cancer.  CONSENT VERIFIED: yes   SET UP: Patient is setup supine   IMMOBILIZATION:  The following immobilization was used:Custom Moldable Pillow, breast board.   NARRATIVE: [name] underwent complex simulation and treatment planning for her boost treatment today.  Her tumor volume was outlined on the planning CT scan. The depth of her cavity was measured  9  MeV electrons will be prescribed to the 92% Isodose line.  14 Gy in 7 fractions will be prescribed to cover her left  lumpectomy cavity  A block will be used for beam modification purposes.  A special port plan is requested.  -----------------------------------  Lonie Peak, MD

## 2013-02-21 ENCOUNTER — Encounter: Payer: Self-pay | Admitting: Radiation Oncology

## 2013-02-22 ENCOUNTER — Ambulatory Visit: Admission: RE | Admit: 2013-02-22 | Payer: Medicaid Other | Source: Ambulatory Visit | Admitting: Radiation Oncology

## 2013-02-22 HISTORY — DX: Personal history of irradiation: Z92.3

## 2013-02-27 ENCOUNTER — Encounter: Payer: Self-pay | Admitting: Radiation Oncology

## 2013-03-01 ENCOUNTER — Encounter: Payer: Self-pay | Admitting: Radiation Oncology

## 2013-03-01 ENCOUNTER — Ambulatory Visit
Admission: RE | Admit: 2013-03-01 | Discharge: 2013-03-01 | Disposition: A | Discharge status: Home / Self Care | Payer: Self-pay | Source: Ambulatory Visit | Attending: Radiation Oncology | Admitting: Radiation Oncology

## 2013-03-01 VITALS — BP 127/76 | HR 78 | Temp 97.7°F | Resp 16 | Wt 154.4 lb

## 2013-03-01 DIAGNOSIS — C50412 Malignant neoplasm of upper-outer quadrant of left female breast: Secondary | ICD-10-CM

## 2013-03-01 NOTE — Progress Notes (Signed)
Patient presents to the clinic today unaccompanied for follow up with Dr. Basilio Cairo. Patient alert and oriented to person, place, and time. No distress noted. Steady gait noted. Pleasant affect noted. Patient denies pain at this time. Patient denies breast pain at this time. Patient denies nipple discharge or bleeding. Patient reports only faint hyperpigmentation of the left/treated breast. Patient reports using radiaplex and vitamin e ointment on this skin. Patient reports an excellent energy level. Reported all findings to Dr. Basilio Cairo.

## 2013-03-01 NOTE — Progress Notes (Signed)
  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  Doing well. On Tamoxifen. Tolerating well.  Skin healing well. Occasional pain in nipple/lumpectomy cavity.  Acknowledges some problems adjusting emotionally to getting back to "normal."                        ALLERGIES:  has No Known Allergies.  Meds: Current Outpatient Prescriptions  Medication Sig Dispense Refill  . hyaluronate sodium (RADIAPLEXRX) GEL Apply topically 2 (two) times daily.      . tamoxifen (NOLVADEX) 20 MG tablet Take 1 tablet (20 mg total) by mouth daily.  90 tablet  12  . non-metallic deodorant (ALRA) MISC Apply 1 application topically daily as needed.       No current facility-administered medications for this encounter.    Physical Findings: The patient is in no acute distress. Patient is alert and oriented.  weight is 154 lb 6.4 oz (70.035 kg). Her oral temperature is 97.7 F (36.5 C). Her blood pressure is 127/76 and her pulse is 78. Her respiration is 16. Marland Kitchen Resolving hyperpigmentation over left breast. Skin intact.  Lab Findings: Lab Results  Component Value Date   WBC 4.4 10/29/2012   HGB 13.9 10/29/2012   HCT 40.8 10/29/2012   MCV 100.2* 10/29/2012   PLT 198 10/29/2012       Radiographic Findings: No results found.  Impression/Plan:  Doing well . Continue Vit E ointment/ Radiaplex for healing.  Given [facility] flyer and discussed class.  I encouraged her to continue with yearly  mammography and followup with medical oncology. I will see her back on an as-needed basis. I have encouraged her to call if she has any issues or concerns in the future. I wished her the very best.   I spent 15 minutes face to face with the patient and more than 50% of that time was spent in counseling and/or coordination of care. _____________________________________   Lonie Peak, MD

## 2013-04-04 ENCOUNTER — Ambulatory Visit (INDEPENDENT_AMBULATORY_CARE_PROVIDER_SITE_OTHER): Payer: Self-pay | Admitting: General Surgery

## 2013-05-01 ENCOUNTER — Other Ambulatory Visit: Payer: Self-pay | Admitting: Physician Assistant

## 2013-05-01 DIAGNOSIS — C50412 Malignant neoplasm of upper-outer quadrant of left female breast: Secondary | ICD-10-CM

## 2013-05-02 ENCOUNTER — Telehealth: Payer: Self-pay | Admitting: Oncology

## 2013-05-02 ENCOUNTER — Other Ambulatory Visit (HOSPITAL_BASED_OUTPATIENT_CLINIC_OR_DEPARTMENT_OTHER): Payer: Medicaid Other | Admitting: Lab

## 2013-05-02 ENCOUNTER — Encounter: Payer: Self-pay | Admitting: Physician Assistant

## 2013-05-02 ENCOUNTER — Ambulatory Visit (HOSPITAL_BASED_OUTPATIENT_CLINIC_OR_DEPARTMENT_OTHER): Payer: Medicaid Other | Admitting: Physician Assistant

## 2013-05-02 VITALS — BP 104/67 | HR 76 | Temp 98.5°F | Resp 20 | Ht 69.0 in | Wt 149.3 lb

## 2013-05-02 DIAGNOSIS — C50419 Malignant neoplasm of upper-outer quadrant of unspecified female breast: Secondary | ICD-10-CM

## 2013-05-02 DIAGNOSIS — D0590 Unspecified type of carcinoma in situ of unspecified breast: Secondary | ICD-10-CM

## 2013-05-02 DIAGNOSIS — D696 Thrombocytopenia, unspecified: Secondary | ICD-10-CM

## 2013-05-02 DIAGNOSIS — R2231 Localized swelling, mass and lump, right upper limb: Secondary | ICD-10-CM

## 2013-05-02 DIAGNOSIS — D630 Anemia in neoplastic disease: Secondary | ICD-10-CM | POA: Insufficient documentation

## 2013-05-02 DIAGNOSIS — C50412 Malignant neoplasm of upper-outer quadrant of left female breast: Secondary | ICD-10-CM

## 2013-05-02 DIAGNOSIS — Z853 Personal history of malignant neoplasm of breast: Secondary | ICD-10-CM

## 2013-05-02 DIAGNOSIS — D649 Anemia, unspecified: Secondary | ICD-10-CM

## 2013-05-02 DIAGNOSIS — R229 Localized swelling, mass and lump, unspecified: Secondary | ICD-10-CM

## 2013-05-02 HISTORY — DX: Anemia in neoplastic disease: D63.0

## 2013-05-02 HISTORY — DX: Localized swelling, mass and lump, right upper limb: R22.31

## 2013-05-02 LAB — COMPREHENSIVE METABOLIC PANEL (CC13)
Albumin: 3.2 g/dL — ABNORMAL LOW (ref 3.5–5.0)
Alkaline Phosphatase: 42 U/L (ref 40–150)
BUN: 12 mg/dL (ref 7.0–26.0)
Creatinine: 0.7 mg/dL (ref 0.6–1.1)
Glucose: 98 mg/dl (ref 70–99)
Potassium: 3.5 mEq/L (ref 3.5–5.1)

## 2013-05-02 LAB — CBC WITH DIFFERENTIAL/PLATELET
EOS%: 1.2 % (ref 0.0–7.0)
Eosinophils Absolute: 0 10*3/uL (ref 0.0–0.5)
HCT: 32.8 % — ABNORMAL LOW (ref 34.8–46.6)
MCH: 34.1 pg — ABNORMAL HIGH (ref 25.1–34.0)
MCHC: 34.1 g/dL (ref 31.5–36.0)
MONO%: 9.4 % (ref 0.0–14.0)
Platelets: 138 10*3/uL — ABNORMAL LOW (ref 145–400)
lymph#: 1.6 10*3/uL (ref 0.9–3.3)
nRBC: 0 % (ref 0–0)

## 2013-05-02 NOTE — Progress Notes (Signed)
ID: [name]   DOB: March 08, 1966  MR#: [id number]  WUJ#:[id number]  PCP: [name], MD GYN:  SUClaud Kelp OTHER MD: Lonie Peak   HISTORY OF PRESENT ILLNESS: She had a mammogram in January 2013 which revealed some calcifications in the left breast. Spot compression views suggested a benign etiology but a six-month followup was recommended. Followup mammogram September 2013 showed suspicious calcifications biopsy is recommended. 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 MRI scan performed 07/31/2012 showed an area of enhancement consistent DCIS with clip marker present.  Lumpectomy 08/28/2012 was performed. This revealed multiple and close it involved margins. 2 sentinel lymph nodes were removed both of which were negative for malignancy, but one does have isolated tumor cells seen in the subcapsular location. In addition a small tiny focus of microinvasive tumor was felt to be seen as well.  The patient underwent reexcision on 09/10/2012, additional DCIS was seen adjacent to the left medial and lateral margin. Her subsequent history is as detailed below  INTERVAL HISTORY: Sue [name] returns today for followup of her left breast cancer. Since her last appointment here, she began on tamoxifen which she is tolerating well. She does have some hot flashes at night, but if anything thinks these have actually improved slightly.   She is still having menstrual cycles, with her last menstrual period being 04/27/2013. She notes that it was "normal".  She admits that she has not been using any forms of contraception, and we did discuss the importance of doing so since she is obviously still ovulating, and she understands that she would not want to get pregnant while taking the tamoxifen.  REVIEW OF SYSTEMS: Sue [name] denies any fevers or chills. She's had no skin changes and denies any abnormal bruising, bleeding, or evidence of abnormal clotting. Her  energy level is fairly good. She denies any nausea or change in bowel habits. She's had no cough, shortness of breath, or chest pain. She denies any abnormal headaches or dizziness, and also denies any unusual myalgias, arthralgias, bony pain, or peripheral swelling.  A detailed review of systems is otherwise noncontributory.   PAST MEDICAL HISTORY: Past Medical History  Diagnosis Date  . Wears glasses   . Breast cancer 08/28/12    Left Breast  . S/P radiation therapy 12/06/12 -01/23/13    Left Breast/Axilla / 46 Gy / 23 Fractions with a Boost to Left Breast / 14 Gy / 7 Fractions  . Use of tamoxifen (Nolvadex) march 2014    PAST SURGICAL HISTORY: Past Surgical History  Procedure Laterality Date  . Foot fusion  3/09    ankle rt  . Dilation and curettage of uterus      Following Miscarriage  . Left breast needle core biopsy  07/26/12    UOQ - Ductal Carcinoma In Situ with Necrosis. Microcalcifications Identified  . Left breast lumpectomy  08/28/12  . Re-excision of left breast cancer on 09/10/12    . Re-excision of breast cancer,superior margins  10/30/2012    Procedure: RE-EXCISION OF BREAST CANCER,SUPERIOR MARGINS;  Surgeon: [name], MD;  Location: [facility];  Service: General;  Laterality: N/A;  left partial mastectomy with excision of margins    FAMILY HISTORY Family History  Problem Relation Age of Onset  . Breast cancer Mother 50    blood cancer too  . Brain cancer Paternal Aunt     diagnosed in late 71s to early 38s  . Breast cancer Cousin  paternal cousin diagnosed; diagnosed in her late 52s  The patient's father is alive at age 58. The patient's mother died at the age of 48 from metastatic breast cancer which was diagnosed when she was 31. The patient has 2 brothers and one sister. There is no other history of breast or ovarian cancer in the immediate family.   GYNECOLOGIC HISTORY:  Menarche age 51, first live birth age 32. Her periods are regular. She never  used birth control pills.  SOCIAL HISTORY: Sue [name] has a bachelor of science degree from [employer] in food science. Her husband Sue [name]. 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 [name], currently 13-year-old, is also at home.   ADVANCED DIRECTIVES: not in place  HEALTH MAINTENANCE: History  Substance Use Topics  . Smoking status: Former Smoker    Types: Cigarettes    Quit date: 11/30/1993  . Smokeless tobacco: Never Used  . Alcohol Use: Yes     Comment: very seldom, 1x per week     Colonoscopy: Never  PAP:  Bone density: Never  Lipid panel:  No Known Allergies  Current Outpatient Prescriptions  Medication Sig Dispense Refill  . hyaluronate sodium (RADIAPLEXRX) GEL Apply topically 2 (two) times daily.      . non-metallic deodorant Thornton Papas) MISC Apply 1 application topically daily as needed.      . tamoxifen (NOLVADEX) 20 MG tablet Take 1 tablet (20 mg total) by mouth daily.  90 tablet  12   No current facility-administered medications for this visit.    OBJECTIVE: middle-aged African American woman who appears well Filed Vitals:   05/02/13 1500  BP: 104/67  Pulse: 76  Temp: 98.5 F (36.9 C)  Resp: 20     Body mass index is 22.04 kg/(m^2).    ECOG FS: 0 Filed Weights   05/02/13 1500  Weight: 149 lb 4.8 oz (67.722 kg)    Sclerae unicteric Oropharynx clear No cervical or supraclavicular adenopathy Lungs clear to auscultation, no rales or rhonchi Heart regular rate and rhythm Abdomen is soft, nontender to palpation, positive bowel sounds MSK no focal spinal tenderness, no peripheral edema Neuro: nonfocal, well oriented with friendly affect Breasts:  The right breast is unremarkable. In the right axilla there is a small, freely movable round mass, slightly greater than 0.5 cm. The left breast is status post lumpectomy and radiation. Skin is hyperpigmented. There is no evidence of local recurrence. Left axilla is unremarkable.   LAB RESULTS: Lab  Results  Component Value Date   WBC 3.3* 05/02/2013   NEUTROABS 1.3* 05/02/2013   HGB 11.2* 05/02/2013   HCT 32.8* 05/02/2013   MCV 100.0 05/02/2013   PLT 138* 05/02/2013      Chemistry      Component Value Date/Time   NA 140 05/02/2013 1453   NA 138 10/29/2012 0841   K 3.5 05/02/2013 1453   K 3.4* 10/29/2012 0841   CL 108* 05/02/2013 1453   CL 103 10/29/2012 0841   CO2 24 05/02/2013 1453   CO2 24 10/29/2012 0841   BUN 12.0 05/02/2013 1453   BUN 12 10/29/2012 0841   CREATININE 0.7 05/02/2013 1453   CREATININE 0.79 10/29/2012 0841      Component Value Date/Time   CALCIUM 8.6 05/02/2013 1453   CALCIUM 9.6 10/29/2012 0841   ALKPHOS 42 05/02/2013 1453   AST 14 05/02/2013 1453   ALT 12 05/02/2013 1453   BILITOT 0.28 05/02/2013 1453  Lab Results  Component Value Date   LABCA2 8 09/20/2012      STUDIES: No results found.   ASSESSMENT: 47 y.o. [place] woman status post left lumpectomy and sentinel lymph node sampling 08/28/2012 for a ductal carcinoma in situ, grade 3, estrogen receptor 100% positive, progesterone receptor negative, with 0 of 2 sentinel lymph nodes sampled involved  (1) there was an area of microinvasion measuring less than a millimeter, too scant for a prognostic panel   (2) reexcision for margin clearance 10/30/2012 was successful   (3) completed adjuvant radiation 01/23/2013  (4) started tamoxifen March 2014  (5) genetic testing at [facility] pending (Patient as not yet made this appointment as of 05/02/2013)   PLAN:  Jasa is tolerating the tamoxifen well with no significant side effects.  We again reviewed that fact that she could become pregnant and needs to use barrier forms of birth control.  She voiced understanding.  I am rechecking her labs in approximately 5 or 6 weeks to follow her mild anemia and thrombocytopenia since this is a recent change. I think the anemia is going to be likely secondary to her recent menstrual cycle.  I am also [facility]  [facility] of the right axilla to evaluate the small palpable mass noted on exam. She is scheduled to see Dr. Derrell Lolling for followup next week, and I have requested that they schedule this ultrasound prior to her visit with him on June 26.  Otherwise Mariangela is due for her next bilateral mammogram in September, and is already scheduled to see Dr. Darnelle Catalan for her next six-month followup in January.  I have reviewed all this in detail with Renea Ee who voices understanding and agreement with this plan. She will call she has any changes or problems.   Damaria Stofko    05/02/2013

## 2013-05-02 NOTE — Telephone Encounter (Signed)
, °

## 2013-05-03 ENCOUNTER — Telehealth (INDEPENDENT_AMBULATORY_CARE_PROVIDER_SITE_OTHER): Payer: Self-pay

## 2013-05-03 NOTE — Telephone Encounter (Signed)
I called and left a message for the pt to call me.  Her appointment is next Thursday with Dr Derrell Lolling and her mammogram is scheduled for 7/2.  Dr Derrell Lolling prefers they be done before he sees the pt.  I was going to ask her if she can move her mgm up.  I don't really have another soon appointment to move her to.

## 2013-05-09 ENCOUNTER — Ambulatory Visit (INDEPENDENT_AMBULATORY_CARE_PROVIDER_SITE_OTHER): Payer: Medicaid Other | Admitting: General Surgery

## 2013-05-09 ENCOUNTER — Encounter (INDEPENDENT_AMBULATORY_CARE_PROVIDER_SITE_OTHER): Payer: Self-pay | Admitting: General Surgery

## 2013-05-09 VITALS — BP 130/90 | HR 72 | Temp 97.8°F | Resp 16

## 2013-05-09 DIAGNOSIS — C50419 Malignant neoplasm of upper-outer quadrant of unspecified female breast: Secondary | ICD-10-CM

## 2013-05-09 DIAGNOSIS — C50412 Malignant neoplasm of upper-outer quadrant of left female breast: Secondary | ICD-10-CM

## 2013-05-09 NOTE — Progress Notes (Signed)
Patient ID: Sue [name], female   DOB: 1966-07-14, 47 y.o.   MRN: [id number]  Chief Complaint  Patient presents with  . Breast Cancer Long Term Follow Up    4 month br ca ck    HPI Sue [name] is a 47 y.o. female.  She returns for followup regarding her left breast cancer.  She was initially diagnosed with DCIS, receptor positive. Solitary finding on MRI. Family history positive for breast cancer in mother and 2 cousins. Genetic counseling has been recommended. She states she is planning to get this done at [facility]. On 08/28/2012 she underwent left partial mastectomy and sentinel node biopsy. She had microinvasive carcinoma with very close margins, one of 2 lymph nodes showed isolated tumor cells. She did not want mastectomy and we reexcised the margins and she had high-grade DCIS in all 3 specimens. We talked about this. She did not want to have mastectomy. She underwent second reexcision of medial margin and reexcision of lateral margin on 10/30/2012 and the final report showed benign breast tissue, no evidence of residual DCIS.  She is doing well surgically. Her clothes fit well. She doesn't have much of a defect. She doesn't have much pain. She is pleased with the cosmetic result  She completed radiation therapy in March 2014. She has lots of pain but this is settling down. She says that the other physicians the schedule her for mammograms next month. She has started on tamoxifen.  HPI  Past Medical History  Diagnosis Date  . Wears glasses   . Breast cancer 08/28/12    Left Breast  . S/P radiation therapy 12/06/12 -01/23/13    Left Breast/Axilla / 46 Gy / 23 Fractions with a Boost to Left Breast / 14 Gy / 7 Fractions  . Use of tamoxifen (Nolvadex) march 2014    Past Surgical History  Procedure Laterality Date  . Foot fusion  3/09    ankle rt  . Dilation and curettage of uterus      Following Miscarriage  . Left breast needle core biopsy  07/26/12    UOQ - Ductal  Carcinoma In Situ with Necrosis. Microcalcifications Identified  . Left breast lumpectomy  08/28/12  . Re-excision of left breast cancer on 09/10/12    . Re-excision of breast cancer,superior margins  10/30/2012    Procedure: RE-EXCISION OF BREAST CANCER,SUPERIOR MARGINS;  Surgeon: [name], MD;  Location: [facility];  Service: General;  Laterality: N/A;  left partial mastectomy with excision of margins    Family History  Problem Relation Age of Onset  . Breast cancer Mother 50    blood cancer too  . Brain cancer Paternal Aunt     diagnosed in late 47s to early 40s  . Breast cancer Cousin     paternal cousin diagnosed; diagnosed in her late 84s    Social History History  Substance Use Topics  . Smoking status: Former Smoker    Types: Cigarettes    Quit date: 11/30/1993  . Smokeless tobacco: Never Used  . Alcohol Use: Yes     Comment: very seldom, 1x per week    No Known Allergies  Current Outpatient Prescriptions  Medication Sig Dispense Refill  . hyaluronate sodium (RADIAPLEXRX) GEL Apply topically 2 (two) times daily.      . tamoxifen (NOLVADEX) 20 MG tablet Take 1 tablet (20 mg total) by mouth daily.  90 tablet  12   No current facility-administered medications for this visit.  Review of Systems Review of Systems  Constitutional: Negative for fever, chills and unexpected weight change.  HENT: Negative for hearing loss, congestion, sore throat, trouble swallowing and voice change.   Eyes: Negative for visual disturbance.  Respiratory: Negative for cough and wheezing.   Cardiovascular: Negative for chest pain, palpitations and leg swelling.  Gastrointestinal: Negative for nausea, vomiting, abdominal pain, diarrhea, constipation, blood in stool, abdominal distention and anal bleeding.  Genitourinary: Negative for hematuria, vaginal bleeding and difficulty urinating.  Musculoskeletal: Negative for arthralgias.  Skin: Negative for rash and wound.  Neurological:  Negative for seizures, syncope and headaches.  Hematological: Negative for adenopathy. Does not bruise/bleed easily.  Psychiatric/Behavioral: Negative for confusion.    Blood pressure 130/90, pulse 72, temperature 97.8 F (36.6 C), temperature source Temporal, resp. rate 16.  Physical Exam Physical Exam  Constitutional: She is oriented to person, place, and time. She appears well-developed and well-nourished. No distress.  HENT:  Head: Normocephalic and atraumatic.  Neck: Neck supple. No JVD present. No tracheal deviation present. No thyromegaly present.  Cardiovascular: Normal rate, regular rhythm, normal heart sounds and intact distal pulses.   No murmur heard. Pulmonary/Chest: Effort normal and breath sounds normal. No respiratory distress. She has no wheezes. She has no rales. She exhibits no tenderness.  Left breast shows a well-healed incision. Normal contour and nipple projection. No sign of infection or fluid collection. Lots of tending from the radiation therapy. Good cosmetic result, overall. No axillary mass  Musculoskeletal: She exhibits no edema and no tenderness.  Lymphadenopathy:    She has no cervical adenopathy.  Neurological: She is alert and oriented to person, place, and time. She exhibits normal muscle tone. Coordination normal.  Skin: Skin is warm. No rash noted. She is not diaphoretic. No erythema. No pallor.  Psychiatric: She has a normal mood and affect. Her behavior is normal. Judgment and thought content normal.    Data Reviewed Notes from [facility]  Assessment    DCIS with microinvasive cancer, left breast, upper outer quadrant with isolated tumor cells and one of 2 lymph nodes, receptor positive.  Uneventful recovery with very good cosmetic result following primary breast surgery, 2 re\re excisions, and adjuvant radiation therapy     Plan    Agree with tamoxifen.  Genetic counseling advised  Mammograms in July  Return to see me  in October of this year.        [name]. Derrell Lolling, M.D., [facility] General and Minimally invasive Surgery Breast and Colorectal Surgery Office:   [phone] Pager:   [phone]  05/09/2013, 12:29 PM

## 2013-05-09 NOTE — Patient Instructions (Signed)
Examination of your breast and the lymph nodes all are normal. There is no evidence of cancer. He seemed to have recovered from the surgery and the radiation therapy well.  Be sure to get your mammograms when scheduled.  Return to see Dr. Derrell Lolling in October.

## 2013-05-15 ENCOUNTER — Ambulatory Visit
Admission: RE | Admit: 2013-05-15 | Discharge: 2013-05-15 | Disposition: A | Discharge status: Home / Self Care | Payer: Medicaid Other | Source: Ambulatory Visit | Attending: Physician Assistant | Admitting: Physician Assistant

## 2013-05-15 DIAGNOSIS — R2231 Localized swelling, mass and lump, right upper limb: Secondary | ICD-10-CM

## 2013-05-15 DIAGNOSIS — Z853 Personal history of malignant neoplasm of breast: Secondary | ICD-10-CM

## 2013-06-13 ENCOUNTER — Other Ambulatory Visit: Payer: Medicaid Other | Admitting: Lab

## 2013-06-20 ENCOUNTER — Other Ambulatory Visit (HOSPITAL_BASED_OUTPATIENT_CLINIC_OR_DEPARTMENT_OTHER): Payer: Medicaid Other | Admitting: Lab

## 2013-06-20 DIAGNOSIS — D649 Anemia, unspecified: Secondary | ICD-10-CM

## 2013-06-20 DIAGNOSIS — D696 Thrombocytopenia, unspecified: Secondary | ICD-10-CM

## 2013-06-20 DIAGNOSIS — Z853 Personal history of malignant neoplasm of breast: Secondary | ICD-10-CM

## 2013-06-20 LAB — CBC WITH DIFFERENTIAL/PLATELET
Basophils Absolute: 0 10*3/uL (ref 0.0–0.1)
Eosinophils Absolute: 0 10*3/uL (ref 0.0–0.5)
HCT: 36.3 % (ref 34.8–46.6)
HGB: 12.1 g/dL (ref 11.6–15.9)
MCV: 102.8 fL — ABNORMAL HIGH (ref 79.5–101.0)
MONO%: 10.5 % (ref 0.0–14.0)
NEUT#: 2.6 10*3/uL (ref 1.5–6.5)
RDW: 13.7 % (ref 11.2–14.5)
lymph#: 1.2 10*3/uL (ref 0.9–3.3)

## 2013-06-24 ENCOUNTER — Telehealth: Payer: Self-pay

## 2013-06-24 NOTE — Telephone Encounter (Signed)
LMOVM regarding lab results for 8/7. Per AB, labs are ok. Call the office with any questions. TMB

## 2013-08-30 ENCOUNTER — Ambulatory Visit (INDEPENDENT_AMBULATORY_CARE_PROVIDER_SITE_OTHER): Payer: Medicaid Other | Admitting: General Surgery

## 2013-09-19 ENCOUNTER — Other Ambulatory Visit: Payer: Self-pay

## 2013-10-25 ENCOUNTER — Telehealth: Payer: Self-pay | Admitting: *Deleted

## 2013-10-25 NOTE — Telephone Encounter (Signed)
Lm informed the pt the [facility], will be on call 12/12/13. gv appt for 12/17/13 w/labs@3pm  and ov@ 3:30pm. Made the pt aware that i will mail a letter/avs...td

## 2013-10-31 ENCOUNTER — Encounter (INDEPENDENT_AMBULATORY_CARE_PROVIDER_SITE_OTHER): Payer: Self-pay | Admitting: General Surgery

## 2013-10-31 ENCOUNTER — Ambulatory Visit (INDEPENDENT_AMBULATORY_CARE_PROVIDER_SITE_OTHER): Payer: Medicaid Other | Admitting: General Surgery

## 2013-10-31 VITALS — BP 114/68 | HR 60 | Temp 98.0°F | Resp 18 | Ht 70.0 in | Wt 179.0 lb

## 2013-10-31 DIAGNOSIS — C50419 Malignant neoplasm of upper-outer quadrant of unspecified female breast: Secondary | ICD-10-CM

## 2013-10-31 DIAGNOSIS — C50412 Malignant neoplasm of upper-outer quadrant of left female breast: Secondary | ICD-10-CM

## 2013-10-31 NOTE — Patient Instructions (Signed)
Your physical exam of the breasts and all the lymph node areas today is normal. The mammograms in July also looked good. There is no evidence of cancer.  Continue to take the tamoxifen and keep all of your appointment Dr. Garwin Brothers are due for bilateral mammograms in July, 2015.  Return to see Dr. Derrell Lolling in July, 2015 after the mammograms are done.

## 2013-10-31 NOTE — Progress Notes (Signed)
Patient ID: Sue [name], female   DOB: 05-25-1966, 47 y.o.   MRN: [id number] History: Sue [name] is a 47 y.o. female. She returns for followup regarding her left breast cancer.  She was initially diagnosed with DCIS, receptor positive. Solitary finding on MRI. Family history positive for breast cancer in mother and 2 cousins. Genetic counseling has been recommended. She states she is still  planning to get this done at [facility]. On 08/28/2012 she underwent left partial mastectomy and sentinel node biopsy. She had microinvasive carcinoma with very close margins, one of 2 lymph nodes showed isolated tumor cells. She did not want mastectomy and we reexcised the margins and she had high-grade DCIS in all 3 specimens. We talked about this. She did not want to have mastectomy. She underwent second reexcision of medial margin and reexcision of lateral margin on 10/30/2012 and the final report showed benign breast tissue, no evidence of residual DCIS. She has completed radiation therapy in March, 2014  and is on tamoxifen. She is doing well surgically. Her clothes fit well. She doesn't have much of a defect. She doesn't have much pain. She is pleased with the cosmetic result  She has no new complaints.  Past history, social history, family history, and review of systems are documented on the chart, unchanged, and noncontributory except as described above  Exam: Constitutional: She is oriented to person, place, and time. She appears well-developed and well-nourished. No distress.  HENT:  Head: Normocephalic and atraumatic.  Neck: Neck supple. No JVD present. No tracheal deviation present. No thyromegaly present.  Cardiovascular: Normal rate, regular rhythm, normal heart sounds and intact distal pulses.  No murmur heard.  Pulmonary/Chest: Effort normal and breath sounds normal. No respiratory distress. She has no wheezes. She has no rales. She exhibits no tenderness.  Left breast shows a  well-healed incision laterally Normal contour and nipple projection. No sign of infection or fluid collection. Lots of tanning from the radiation therapy. Good cosmetic result, overall. No axillary mass .  Lymphadenopathy:  She has no cervical adenopathy.  Neurological: She is alert and oriented to person, place, and time. She exhibits normal muscle tone. Coordination normal.  Skin: Skin is warm. No rash noted. She is not diaphoretic. No erythema. No pallor.  Psychiatric: She has a normal mood and affect. Her behavior is normal. Judgment and thought content normal.   Assessment: DCIS with microinvasive cancer, left breast, upper outer quadrant with isolated tumor cells and one of 2 lymph nodes, receptor positive.  Uneventful recovery with very good cosmetic result following primary breast surgery, 2 re excisions achieving negative margins,, and adjuvant radiation therapy   Plan: Continue tamoxifen and biannual evaluation by Dr. [facility] and testing at [facility] advised and reinforced. She says she still plans this. Mammograms in July, 2015 Return to see me in July 2015 after mammograms. Perhaps annually thereafter.   [name]. Derrell Lolling, M.D., [facility] General and Minimally invasive Surgery Breast and Colorectal Surgery Office:   [phone] Pager:   [phone]

## 2013-12-12 ENCOUNTER — Ambulatory Visit: Payer: Medicaid Other | Admitting: Oncology

## 2013-12-12 ENCOUNTER — Other Ambulatory Visit: Payer: Medicaid Other

## 2013-12-16 ENCOUNTER — Other Ambulatory Visit: Payer: Self-pay | Admitting: *Deleted

## 2013-12-16 DIAGNOSIS — C50419 Malignant neoplasm of upper-outer quadrant of unspecified female breast: Secondary | ICD-10-CM

## 2013-12-17 ENCOUNTER — Ambulatory Visit (HOSPITAL_BASED_OUTPATIENT_CLINIC_OR_DEPARTMENT_OTHER): Payer: Medicaid Other | Admitting: Oncology

## 2013-12-17 ENCOUNTER — Telehealth: Payer: Self-pay | Admitting: Oncology

## 2013-12-17 ENCOUNTER — Other Ambulatory Visit (HOSPITAL_BASED_OUTPATIENT_CLINIC_OR_DEPARTMENT_OTHER): Payer: Medicaid Other

## 2013-12-17 VITALS — BP 125/86 | HR 72 | Temp 98.2°F | Resp 18 | Ht 70.0 in | Wt 177.8 lb

## 2013-12-17 DIAGNOSIS — D0590 Unspecified type of carcinoma in situ of unspecified breast: Secondary | ICD-10-CM

## 2013-12-17 DIAGNOSIS — C50412 Malignant neoplasm of upper-outer quadrant of left female breast: Secondary | ICD-10-CM

## 2013-12-17 DIAGNOSIS — Z170 Estrogen receptor positive status [ER+]: Secondary | ICD-10-CM

## 2013-12-17 DIAGNOSIS — C50419 Malignant neoplasm of upper-outer quadrant of unspecified female breast: Secondary | ICD-10-CM

## 2013-12-17 HISTORY — DX: Estrogen receptor positive status (ER+): Z17.0

## 2013-12-17 HISTORY — DX: Malignant neoplasm of upper-outer quadrant of left female breast: C50.412

## 2013-12-17 LAB — CBC WITH DIFFERENTIAL/PLATELET
BASO%: 0.7 % (ref 0.0–2.0)
Basophils Absolute: 0 10*3/uL (ref 0.0–0.1)
EOS%: 0.9 % (ref 0.0–7.0)
Eosinophils Absolute: 0 10*3/uL (ref 0.0–0.5)
HCT: 36.9 % (ref 34.8–46.6)
HGB: 12.4 g/dL (ref 11.6–15.9)
LYMPH%: 42.6 % (ref 14.0–49.7)
MCH: 33.8 pg (ref 25.1–34.0)
MCHC: 33.6 g/dL (ref 31.5–36.0)
MCV: 100.5 fL (ref 79.5–101.0)
MONO#: 0.5 10*3/uL (ref 0.1–0.9)
MONO%: 13.4 % (ref 0.0–14.0)
NEUT#: 1.6 10*3/uL (ref 1.5–6.5)
NEUT%: 42.4 % (ref 38.4–76.8)
Platelets: 173 10*3/uL (ref 145–400)
RBC: 3.67 10*6/uL — AB (ref 3.70–5.45)
RDW: 13.2 % (ref 11.2–14.5)
WBC: 3.8 10*3/uL — ABNORMAL LOW (ref 3.9–10.3)
lymph#: 1.6 10*3/uL (ref 0.9–3.3)

## 2013-12-17 LAB — COMPREHENSIVE METABOLIC PANEL (CC13)
ALBUMIN: 3.8 g/dL (ref 3.5–5.0)
ALT: 9 U/L (ref 0–55)
AST: 14 U/L (ref 5–34)
Alkaline Phosphatase: 47 U/L (ref 40–150)
Anion Gap: 7 mEq/L (ref 3–11)
BUN: 15 mg/dL (ref 7.0–26.0)
CALCIUM: 9 mg/dL (ref 8.4–10.4)
CHLORIDE: 109 meq/L (ref 98–109)
CO2: 25 mEq/L (ref 22–29)
Creatinine: 0.8 mg/dL (ref 0.6–1.1)
Glucose: 93 mg/dl (ref 70–140)
Potassium: 4.2 mEq/L (ref 3.5–5.1)
Sodium: 141 mEq/L (ref 136–145)
TOTAL PROTEIN: 6.7 g/dL (ref 6.4–8.3)
Total Bilirubin: 0.3 mg/dL (ref 0.20–1.20)

## 2013-12-17 NOTE — Progress Notes (Signed)
ID: [name]   DOB: 07-May-1966  MR#: [id number]  VHQ#:[id number]  PCP: Frederico Hamman, MD GYN:  SUFanny Skates OTHER MD: Eppie Gibson   HISTORY OF PRESENT ILLNESS: She had a mammogram in January 2013 which revealed some calcifications in the left breast. Spot compression views suggested a benign etiology but a six-month followup was recommended. Followup mammogram September 2013 showed suspicious calcifications biopsy is recommended. 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 MRI scan performed 07/31/2012 showed an area of enhancement consistent DCIS with clip marker present.  Lumpectomy 08/28/2012 was performed. This revealed multiple and close it involved margins. 2 sentinel lymph nodes were removed both of which were negative for malignancy, but one does have isolated tumor cells seen in the subcapsular location. In addition a small tiny focus of microinvasive tumor was felt to be seen as well.  The patient underwent reexcision on 09/10/2012, additional DCIS was seen adjacent to the left medial and lateral margin. Her subsequent history is as detailed below  INTERVAL HISTORY: Verlena returns today for followup of her left breast cancer. After the last visit here she had her right axillary ultrasound which showed a completely normal lymph node to be the mass we were palpating. Since that time she is "8 lost less anxious". Her periods stopped in September. "And I am not pregnant".  REVIEW OF SYSTEMS: Israella started to gain some weight so she started an exercise program where they aerobics and weight lifting and cut her calories in half. She is having hot flashes but they are not a major issue for her. Vaginal dryness is not a problem. She denies mood changes. A detailed review of systems today was otherwise negative.   PAST MEDICAL HISTORY: Past Medical History  Diagnosis Date  . Wears glasses   . Breast cancer 08/28/12    Left Breast  .  S/P radiation therapy 12/06/12 -01/23/13    Left Breast/Axilla / 46 Gy / 23 Fractions with a Boost to Left Breast / 14 Gy / 7 Fractions  . Use of tamoxifen (Nolvadex) march 2014    PAST SURGICAL HISTORY: Past Surgical History  Procedure Laterality Date  . Foot fusion  3/09    ankle rt  . Dilation and curettage of uterus      Following Miscarriage  . Left breast needle core biopsy  07/26/12    UOQ - Ductal Carcinoma In Situ with Necrosis. Microcalcifications Identified  . Left breast lumpectomy  08/28/12  . Re-excision of left breast cancer on 09/10/12    . Re-excision of breast cancer,superior margins  10/30/2012    Procedure: RE-EXCISION OF BREAST CANCER,SUPERIOR MARGINS;  Surgeon: Adin Hector, MD;  Location: [facility];  Service: General;  Laterality: N/A;  left partial mastectomy with excision of margins    FAMILY HISTORY Family History  Problem Relation Age of Onset  . Breast cancer Mother 30    blood cancer too  . Brain cancer Paternal Aunt     diagnosed in late 56s to early 91s  . Breast cancer Cousin     paternal cousin diagnosed; diagnosed in her late 27s  The patient's father is alive at age 29. The patient's mother died at the age of 85 from metastatic breast cancer which was diagnosed when she was 11. The patient has 2 brothers and one sister. There is no other history of breast or ovarian cancer in the immediate family.   GYNECOLOGIC HISTORY:  Menarche age  39, first live birth age 27. Her periods were regular until September 2014, when they stopped. She never used birth control pills.  SOCIAL HISTORY: Jorah has a bachelor of science degree from [employer] in food science. Her husband Micah Flesher. Amedeo Plenty is in Press photographer for [employer]. Daughter [name], currently 87-year-old, is also at home. She at tends [employer] will. The patient is a Nurse, learning disability.  ADVANCED DIRECTIVES: not in place  HEALTH MAINTENANCE: History  Substance Use Topics  . Smoking  status: Former Smoker    Types: Cigarettes    Quit date: 11/30/1993  . Smokeless tobacco: Never Used  . Alcohol Use: Yes     Comment: very seldom, 1x per week     Colonoscopy: Never  PAP:  Bone density: Never  Lipid panel:  No Known Allergies  Current Outpatient Prescriptions  Medication Sig Dispense Refill  . hyaluronate sodium (RADIAPLEXRX) GEL Apply topically 2 (two) times daily.      . tamoxifen (NOLVADEX) 20 MG tablet Take 1 tablet (20 mg total) by mouth daily.  90 tablet  12   No current facility-administered medications for this visit.    OBJECTIVE: middle-aged African American woman in no acute distress Filed Vitals:   12/17/13 1538  BP: 125/86  Pulse: 72  Temp: 98.2 F (36.8 C)  Resp: 18     Body mass index is 25.51 kg/(m^2).    ECOG FS: 0 Filed Weights   12/17/13 1538  Weight: 177 lb 12.8 oz (80.65 kg)    Sclerae unicteric, pupils equal and round Oropharynx clear and moist  No cervical or supraclavicular adenopathy Lungs clear to auscultation, no rales or rhonchi Heart regular rate and rhythm Abdomen is soft, nontender to palpation, positive bowel sounds MSK no focal spinal tenderness, no peripheral edema Neuro: nonfocal, well oriented with friendly affect Breasts:  The right breast is unremarkable. The right axilla is entirely unremarkable by exam today The left breast is status post lumpectomy and radiation.  There is no evidence of local recurrence. Left axilla is unremarkable.   LAB RESULTS: Lab Results  Component Value Date   WBC 3.8* 12/17/2013   NEUTROABS 1.6 12/17/2013   HGB 12.4 12/17/2013   HCT 36.9 12/17/2013   MCV 100.5 12/17/2013   PLT 173 12/17/2013      Chemistry      Component Value Date/Time   NA 141 12/17/2013 1503   NA 138 10/29/2012 0841   K 4.2 12/17/2013 1503   K 3.4* 10/29/2012 0841   CL 108* 05/02/2013 1453   CL 103 10/29/2012 0841   CO2 25 12/17/2013 1503   CO2 24 10/29/2012 0841   BUN 15.0 12/17/2013 1503   BUN 12 10/29/2012 0841    CREATININE 0.8 12/17/2013 1503   CREATININE 0.79 10/29/2012 0841      Component Value Date/Time   CALCIUM 9.0 12/17/2013 1503   CALCIUM 9.6 10/29/2012 0841   ALKPHOS 47 12/17/2013 1503   AST 14 12/17/2013 1503   ALT 9 12/17/2013 1503   BILITOT 0.30 12/17/2013 1503       Lab Results  Component Value Date   LABCA2 8 09/20/2012      STUDIES: No results found.  ASSESSMENT: 48 y.o. [place] woman status post left lumpectomy and sentinel lymph node sampling 08/28/2012 for a ductal carcinoma in situ, grade 3, estrogen receptor 100% positive, progesterone receptor negative, with 0 of 2 sentinel lymph nodes sampled involved  (1) there was an area of  microinvasion measuring less than a millimeter, too scant for a prognostic panel   (2) reexcision for margin clearance 10/30/2012 was successful   (3) completed adjuvant radiation 01/23/2013  (4) started tamoxifen March 2014  (5) genetic testing at [facility] pending (Patient has not yet made this appointment)  (6) menstrual periods stopped September 2014   PLAN:  Orabelle is doing fine with the tamoxifen and there is no evidence of disease recurrence. We are going to continue tamoxifen at least another year in case her periods recur. If they do not we will consider switching to an aromatase inhibitor.  She is concerned that tamoxifen might be causing weight gain, but of course weight gain is a common accompaniment of menopause. We have good randomized data that tamoxifen does not cause weight gain and groups.  She is going to see [place] again in 6 months and will continue to follow her on an every 6 month basis until her 5 years are up. She knows to call for any problems that may develop before her next visit here.   MAGRINAT,GUSTAV C    12/17/2013

## 2013-12-17 NOTE — Telephone Encounter (Signed)
, °

## 2014-02-27 IMAGING — MG MM BREAST STEREO BIOPSY*L*
2 series · 2 of 2 positions shown · non-contrast
Comparison: Previous exams.

***ADDENDUM*** CREATED: 07/27/2012 [DATE]

Pathology results demonstrate DCIS.  Pathology results are
concordant with imaging findings.
I have discussed pathology results with the patient by telephone
today, 07/27/2012.  The patient is scheduled for a right mammogram
prior to MRI on 07/31/2012 at [DATE].  She is scheduled for a
bilateral breast MRI 07/31/2012 at [DATE] a.m.  She is scheduled for
surgical consultation with Dr. Benzeima on 08/02/2012 at [DATE].
The patient reports doing well following the biopsy.  Her questions
were answered.
***END ADDENDUM*** SIGNED BY: Abdulgafoor Medo, M.D.
CLINICAL DATA: Cluster of heterogeneous calcifications in the
upper outer left breast.
STEREOTACTIC-GUIDED VACUUM ASSISTED BIOPSY OF THE LEFT BREAST AND
SPECIMEN RADIOGRAPH

[L CC]
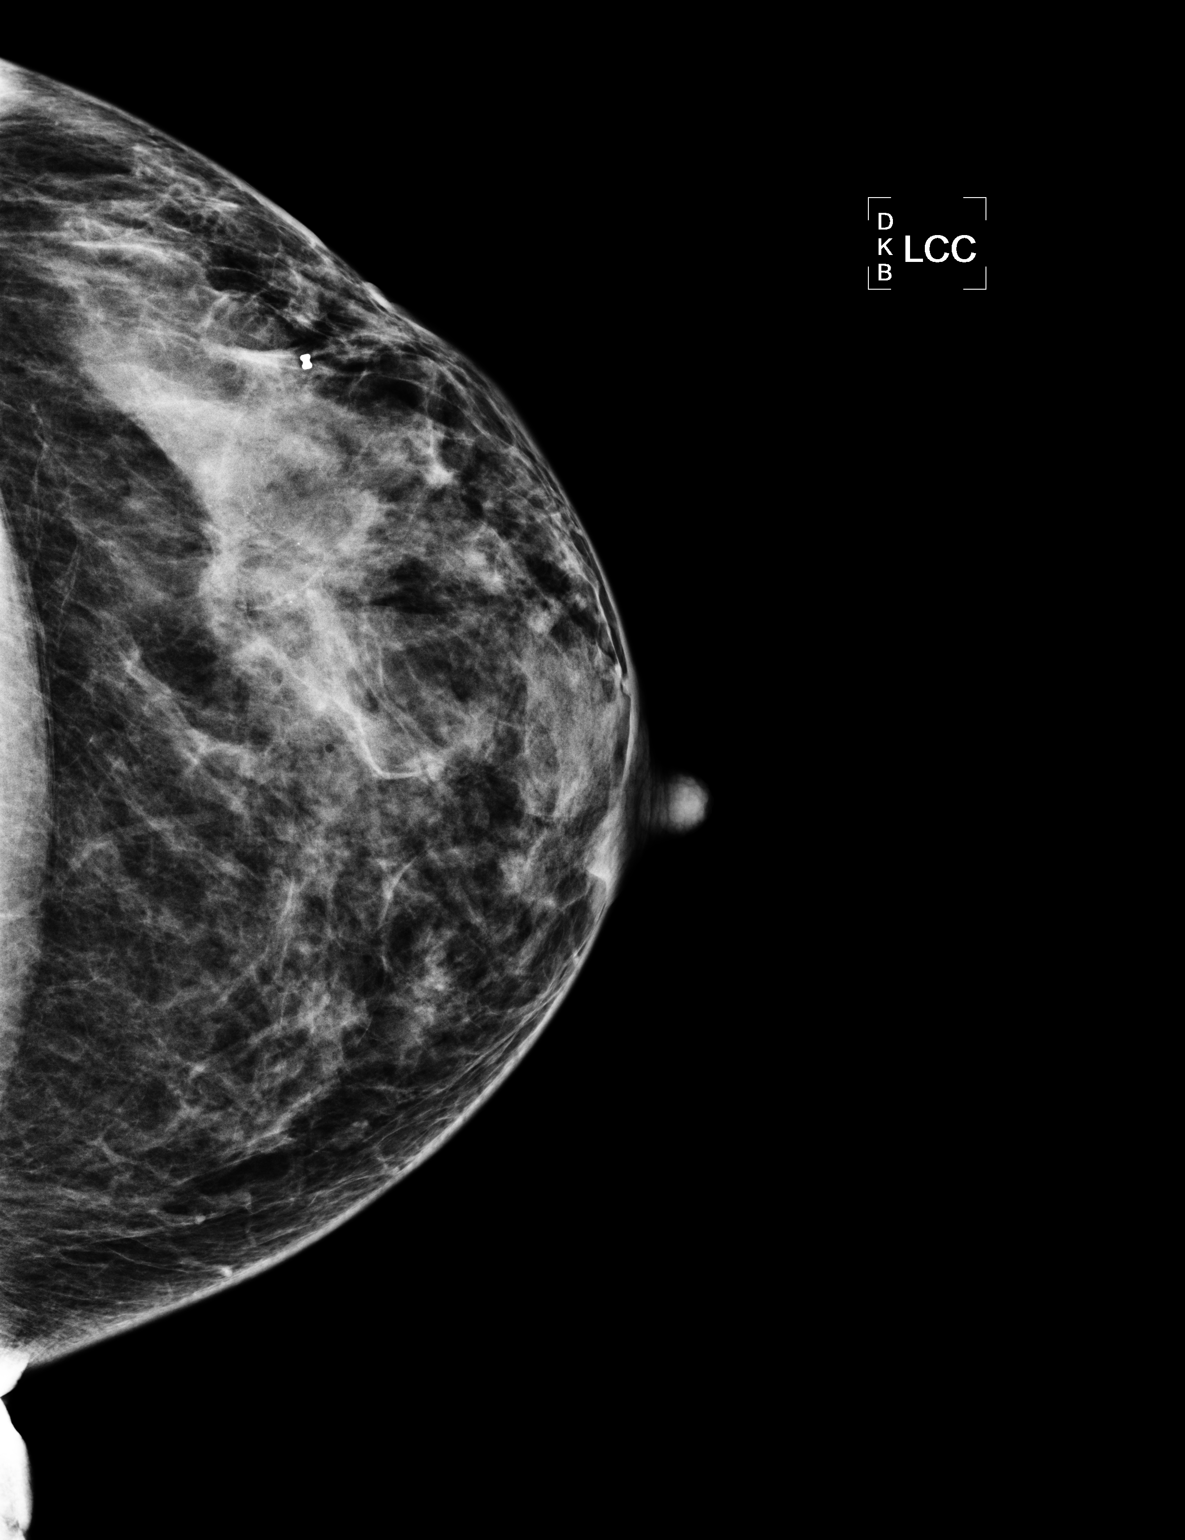

[L ML]
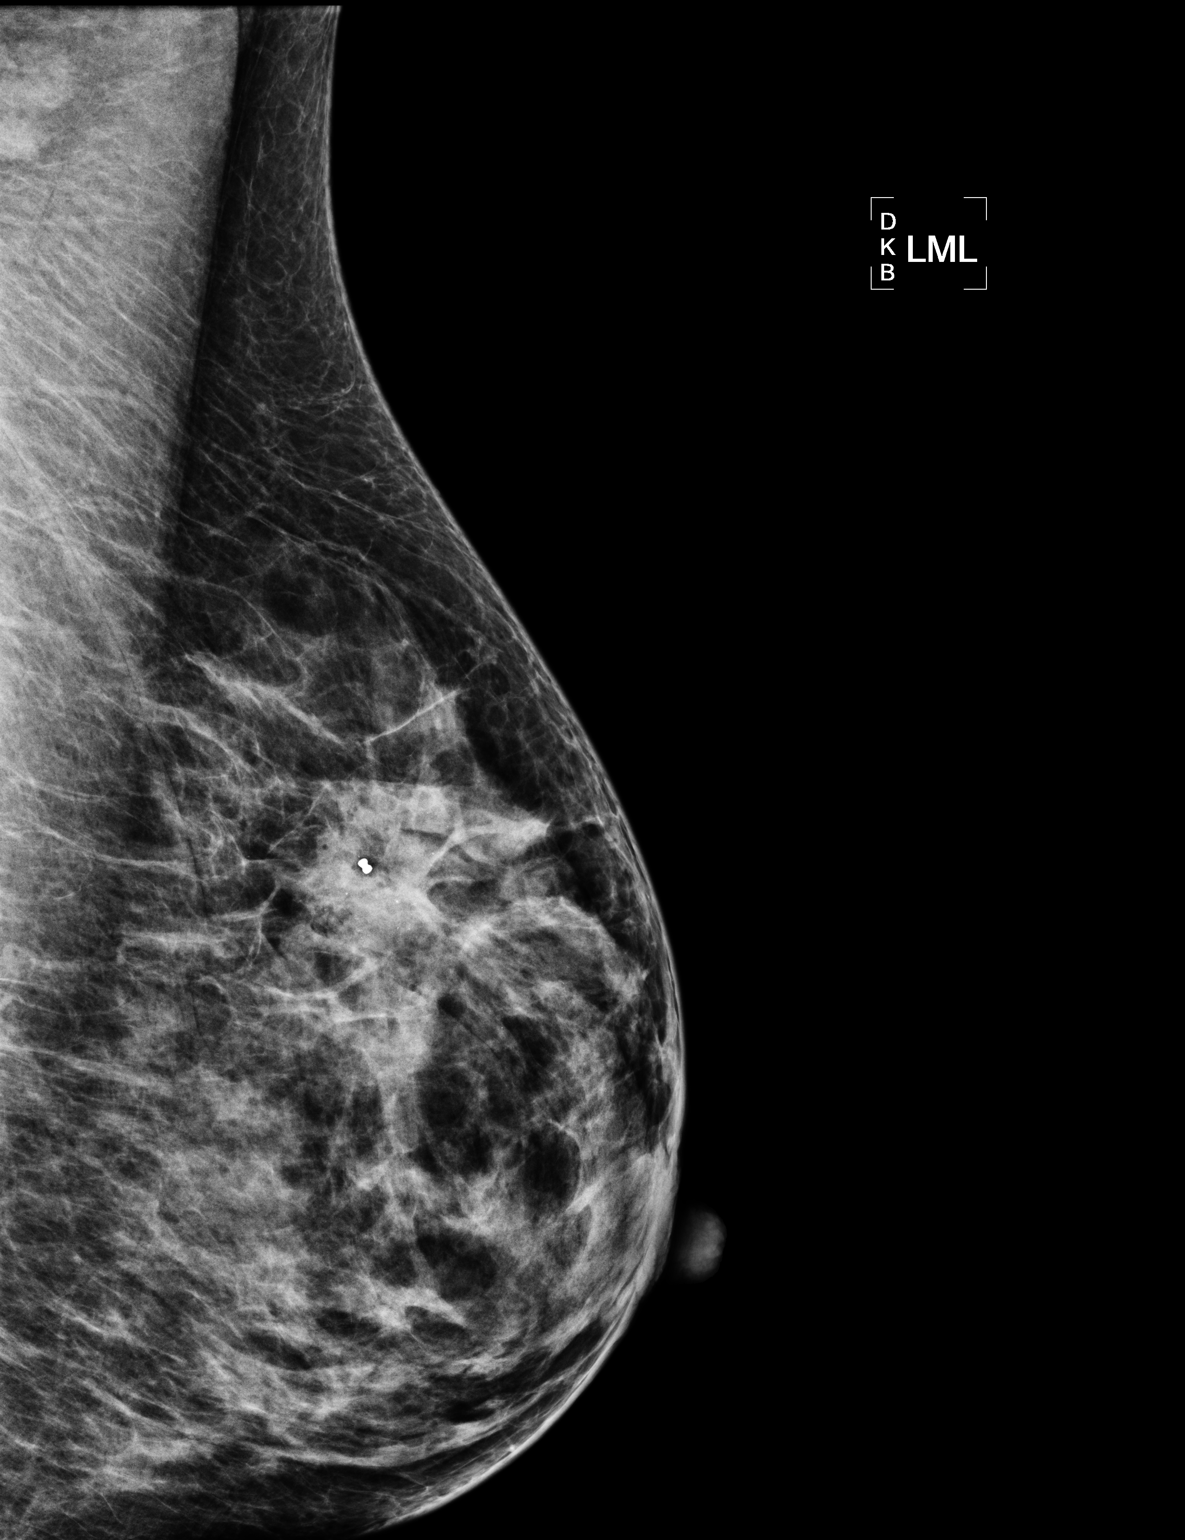

[2 of 2 positions shown; findings below may reference images not displayed]

I met with the patient and we discussed the procedure of
stereotactic-guided biopsy, including benefits and alternatives.
We discussed the high likelihood of a successful procedure. We
discussed the risks of the procedure, including infection,
bleeding, tissue injury, clip migration, and inadequate sampling.
Informed, written consent was given.

Using sterile technique, 2% lidocaine, stereotactic guidance, and a
9 gauge vacuum assisted device, biopsy was performed of a cluster
of calcifications using a lateral to medial approach.  Specimen
radiograph was performed, showing multiple calcifications within
several of the cores.  Specimens with calcifications are identified
for pathology.

At the conclusion of the procedure, a TriMark tissue marker clip
was deployed into the biopsy cavity.  Follow-up 2-view mammogram
confirmed clip placement to be in satisfactory position, along the
lateral margin of the cluster of calcifications.
IMPRESSION: Stereotactic-guided biopsy of left breast.  No apparent
complications.

## 2014-03-25 ENCOUNTER — Other Ambulatory Visit: Payer: Self-pay | Admitting: Oncology

## 2014-03-27 IMAGING — CR DG CHEST 2V
2 series · 2 of 2 positions shown · non-contrast
Comparison: 11/12/2008

CLINICAL DATA: Preop breast cancer

CHEST - 2 VIEW

[w chest pa]
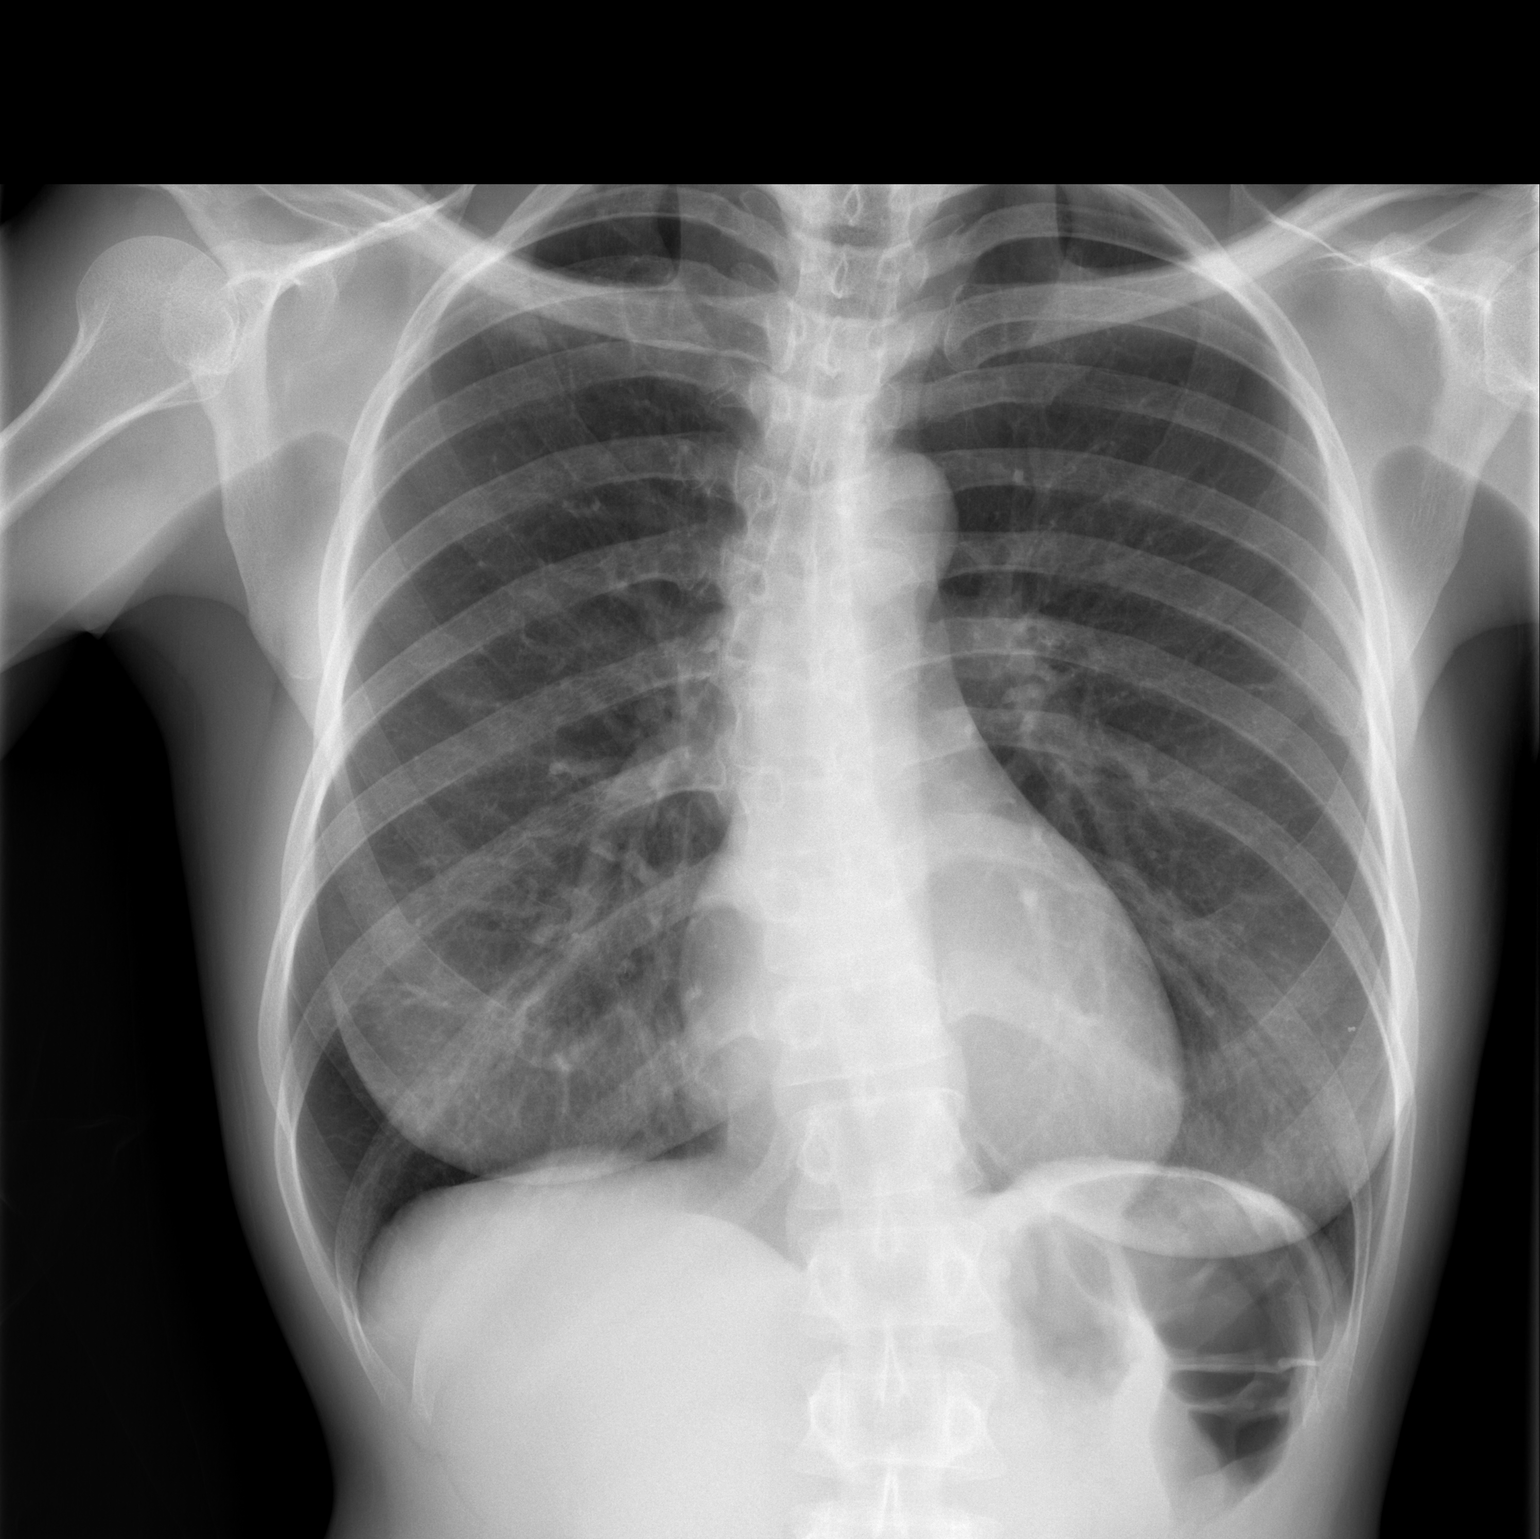

[w chest lat]
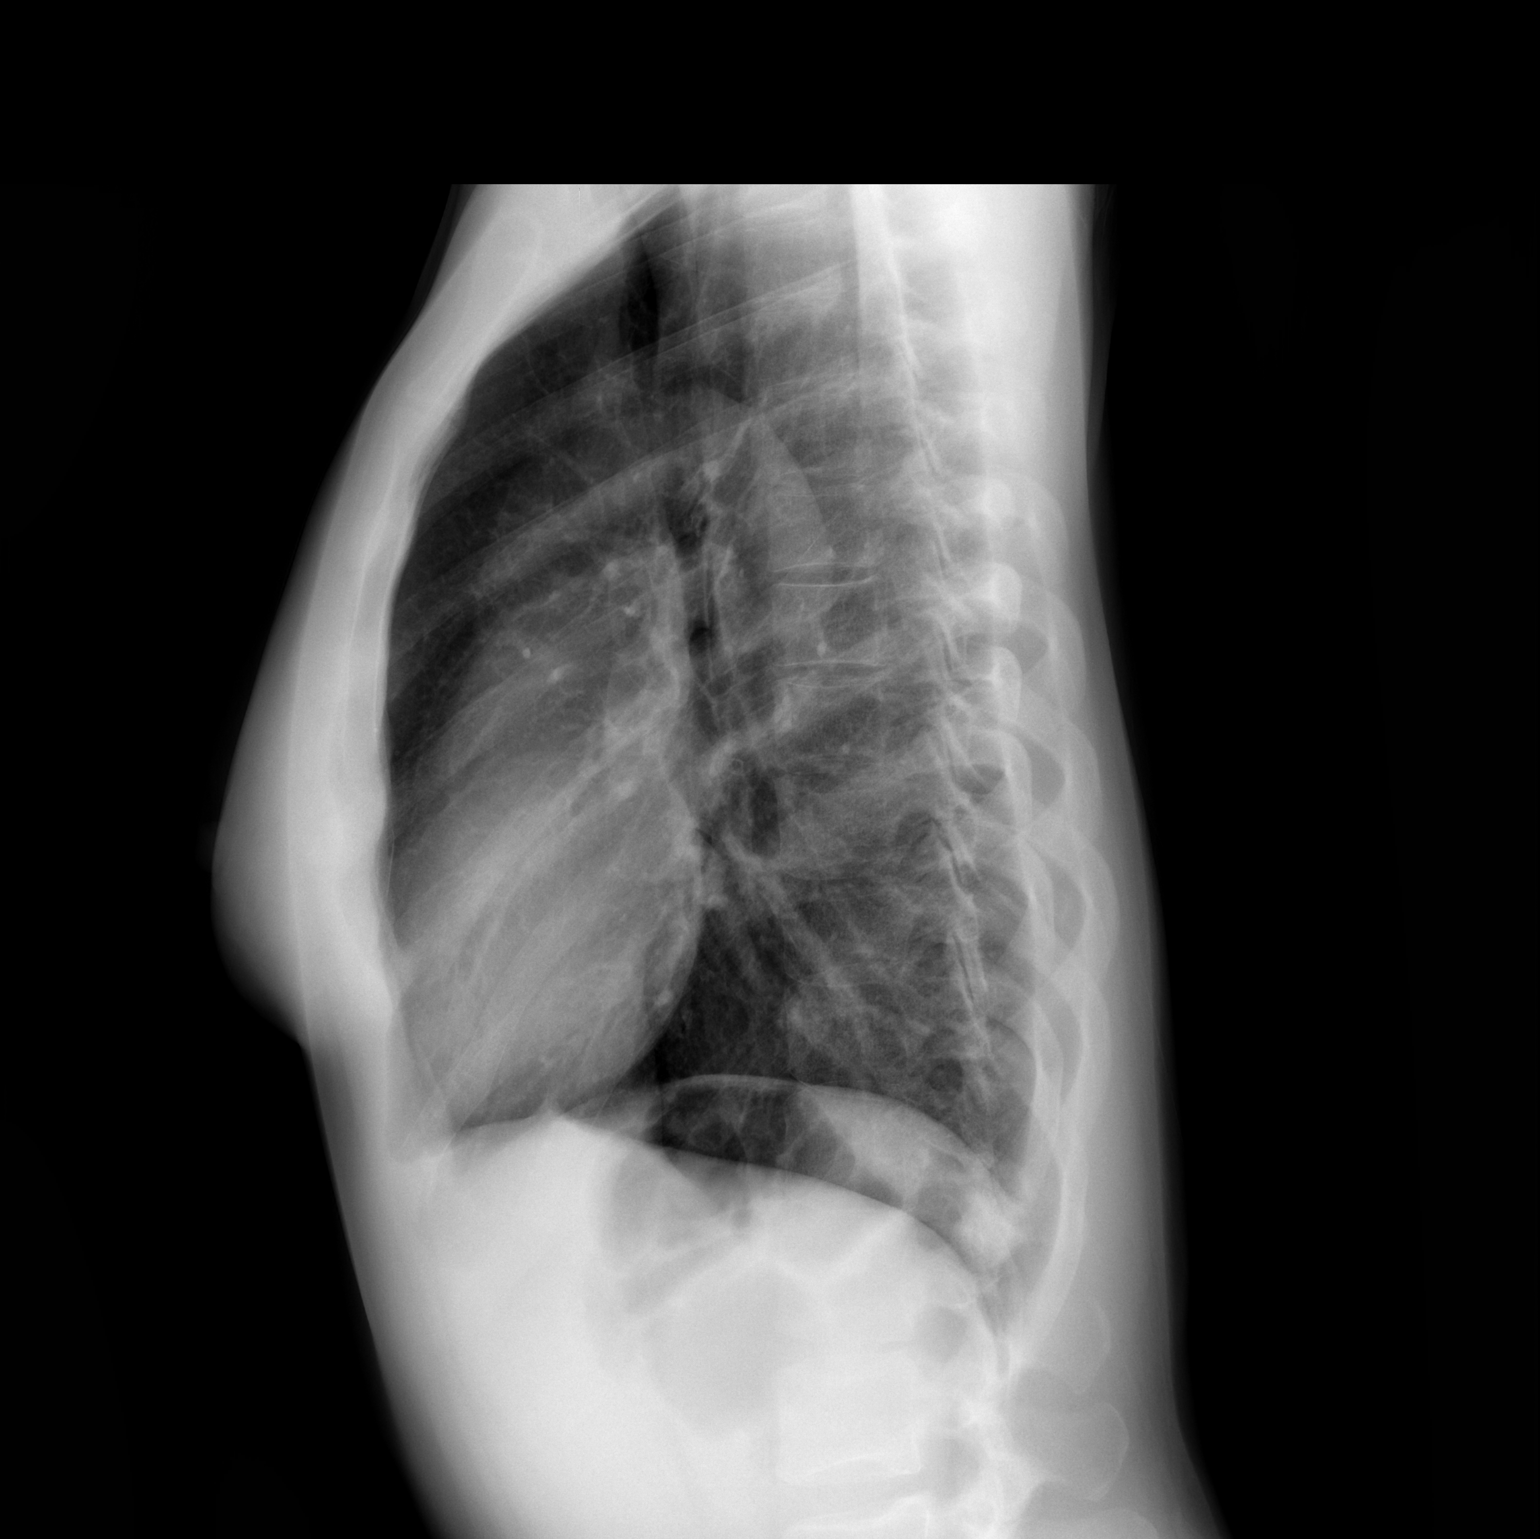

[2 of 2 positions shown; findings below may reference images not displayed]

FINDINGS: Dextroscoliosis in the mid thoracic spine is unchanged.
No focal bony abnormality.

  The lungs are clear without infiltrate or effusion.  No mass
lesion is identified.
IMPRESSION: No active cardiopulmonary disease.

## 2014-05-02 ENCOUNTER — Other Ambulatory Visit: Payer: Self-pay | Admitting: Oncology

## 2014-05-02 DIAGNOSIS — Z853 Personal history of malignant neoplasm of breast: Secondary | ICD-10-CM

## 2014-05-07 ENCOUNTER — Encounter (INDEPENDENT_AMBULATORY_CARE_PROVIDER_SITE_OTHER): Payer: Self-pay | Admitting: General Surgery

## 2014-05-19 ENCOUNTER — Ambulatory Visit
Admission: RE | Admit: 2014-05-19 | Discharge: 2014-05-19 | Disposition: A | Discharge status: Home / Self Care | Payer: Medicaid Other | Source: Ambulatory Visit | Attending: Oncology | Admitting: Oncology

## 2014-05-19 DIAGNOSIS — Z853 Personal history of malignant neoplasm of breast: Secondary | ICD-10-CM

## 2014-06-23 ENCOUNTER — Telehealth: Payer: Self-pay | Admitting: Nurse Practitioner

## 2014-06-23 NOTE — Telephone Encounter (Signed)
Pt cld to gett ime of appt-gave pt time & date-pt understood

## 2014-06-25 ENCOUNTER — Other Ambulatory Visit: Payer: Self-pay | Admitting: *Deleted

## 2014-06-25 DIAGNOSIS — C50412 Malignant neoplasm of upper-outer quadrant of left female breast: Secondary | ICD-10-CM

## 2014-06-26 ENCOUNTER — Ambulatory Visit: Payer: Medicaid Other | Admitting: Nurse Practitioner

## 2014-06-26 ENCOUNTER — Other Ambulatory Visit (HOSPITAL_BASED_OUTPATIENT_CLINIC_OR_DEPARTMENT_OTHER): Payer: Medicaid Other

## 2014-06-26 ENCOUNTER — Telehealth: Payer: Self-pay | Admitting: Nurse Practitioner

## 2014-06-26 DIAGNOSIS — C50412 Malignant neoplasm of upper-outer quadrant of left female breast: Secondary | ICD-10-CM

## 2014-06-26 DIAGNOSIS — D0590 Unspecified type of carcinoma in situ of unspecified breast: Secondary | ICD-10-CM

## 2014-06-26 LAB — CBC WITH DIFFERENTIAL/PLATELET
BASO%: 1.1 % (ref 0.0–2.0)
Basophils Absolute: 0 10*3/uL (ref 0.0–0.1)
EOS ABS: 0.1 10*3/uL (ref 0.0–0.5)
EOS%: 1.4 % (ref 0.0–7.0)
HCT: 37.4 % (ref 34.8–46.6)
HGB: 12.4 g/dL (ref 11.6–15.9)
LYMPH%: 37.8 % (ref 14.0–49.7)
MCH: 34.1 pg — ABNORMAL HIGH (ref 25.1–34.0)
MCHC: 33.1 g/dL (ref 31.5–36.0)
MCV: 103 fL — ABNORMAL HIGH (ref 79.5–101.0)
MONO#: 0.5 10*3/uL (ref 0.1–0.9)
MONO%: 13.1 % (ref 0.0–14.0)
NEUT%: 46.6 % (ref 38.4–76.8)
NEUTROS ABS: 1.7 10*3/uL (ref 1.5–6.5)
Platelets: 158 10*3/uL (ref 145–400)
RBC: 3.63 10*6/uL — AB (ref 3.70–5.45)
RDW: 14 % (ref 11.2–14.5)
WBC: 3.7 10*3/uL — ABNORMAL LOW (ref 3.9–10.3)
lymph#: 1.4 10*3/uL (ref 0.9–3.3)

## 2014-06-26 LAB — COMPREHENSIVE METABOLIC PANEL (CC13)
ALBUMIN: 3.4 g/dL — AB (ref 3.5–5.0)
ALT: 10 U/L (ref 0–55)
AST: 19 U/L (ref 5–34)
Alkaline Phosphatase: 47 U/L (ref 40–150)
Anion Gap: 9 mEq/L (ref 3–11)
BUN: 10.2 mg/dL (ref 7.0–26.0)
CO2: 24 mEq/L (ref 22–29)
Calcium: 8.8 mg/dL (ref 8.4–10.4)
Chloride: 107 mEq/L (ref 98–109)
Creatinine: 0.8 mg/dL (ref 0.6–1.1)
GLUCOSE: 99 mg/dL (ref 70–140)
POTASSIUM: 4 meq/L (ref 3.5–5.1)
SODIUM: 140 meq/L (ref 136–145)
TOTAL PROTEIN: 6.4 g/dL (ref 6.4–8.3)
Total Bilirubin: 0.33 mg/dL (ref 0.20–1.20)

## 2014-06-27 ENCOUNTER — Encounter: Payer: Self-pay | Admitting: Nurse Practitioner

## 2014-06-27 ENCOUNTER — Ambulatory Visit (HOSPITAL_BASED_OUTPATIENT_CLINIC_OR_DEPARTMENT_OTHER): Payer: Medicaid Other | Admitting: Nurse Practitioner

## 2014-06-27 ENCOUNTER — Telehealth: Payer: Self-pay | Admitting: Nurse Practitioner

## 2014-06-27 VITALS — BP 144/79 | HR 67 | Temp 98.4°F | Resp 20 | Ht 70.0 in | Wt 165.5 lb

## 2014-06-27 DIAGNOSIS — Z853 Personal history of malignant neoplasm of breast: Secondary | ICD-10-CM

## 2014-06-27 DIAGNOSIS — Z79810 Long term (current) use of selective estrogen receptor modulators (SERMs): Secondary | ICD-10-CM

## 2014-06-27 DIAGNOSIS — C50412 Malignant neoplasm of upper-outer quadrant of left female breast: Secondary | ICD-10-CM

## 2014-06-27 NOTE — Progress Notes (Signed)
ID: Sue [name]   DOB: [birth year]  MR#: [id number]  DPO#:[id number]  PCP: Frederico Hamman, MD GYN:  SUFanny Skates OTHER MD: Eppie Gibson  CHIEF COMPLAINT: left breast cancer CURRENT THERAPY: Tamoxifen 20mg  daily  BREAST CANCER HISTORY: She had a mammogram in January 2013 which revealed some calcifications in the left breast. Spot compression views suggested a benign etiology but a six-month followup was recommended. Followup mammogram September 2013 showed suspicious calcifications biopsy is recommended. 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 MRI scan performed 07/31/2012 showed an area of enhancement consistent DCIS with clip marker present.  Lumpectomy 08/28/2012 was performed. This revealed multiple and close it involved margins. 2 sentinel lymph nodes were removed both of which were negative for malignancy, but one does have isolated tumor cells seen in the subcapsular location. In addition a small tiny focus of microinvasive tumor was felt to be seen as well.  The patient underwent reexcision on 09/10/2012, additional DCIS was seen adjacent to the left medial and lateral margin. Her subsequent history is as detailed below  INTERVAL HISTORY: Sue [name] returns today for followup of her left breast cancer. Her interval history is notable for the return of her period. It appeared 2 days ago and is very light, 1 pad is enough coverage for the day. Her last period was September 2014. I informed her that it was normal for her periods to become irregular as she approached menopause. She has been on tamoxifen since March 2014 tolerated it well, besides the very rare hot flash. She denies vaginal dryness. Sue [name] is still exercising at her local gym and minding her calorie intake.   REVIEW OF SYSTEMS: A detailed review of systems was otherwise negative except as noted above.   PAST MEDICAL HISTORY: Past Medical History  Diagnosis Date  . Wears  glasses   . Breast cancer 08/28/12    Left Breast  . S/P radiation therapy 12/06/12 -01/23/13    Left Breast/Axilla / 46 Gy / 23 Fractions with a Boost to Left Breast / 14 Gy / 7 Fractions  . Use of tamoxifen (Nolvadex) march 2014    PAST SURGICAL HISTORY: Past Surgical History  Procedure Laterality Date  . Foot fusion  3/09    ankle rt  . Dilation and curettage of uterus      Following Miscarriage  . Left breast needle core biopsy  07/26/12    UOQ - Ductal Carcinoma In Situ with Necrosis. Microcalcifications Identified  . Left breast lumpectomy  08/28/12  . Re-excision of left breast cancer on 09/10/12    . Re-excision of breast cancer,superior margins  10/30/2012    Procedure: RE-EXCISION OF BREAST CANCER,SUPERIOR MARGINS;  Surgeon: Adin Hector, MD;  Location: [facility];  Service: General;  Laterality: N/A;  left partial mastectomy with excision of margins    FAMILY HISTORY Family History  Problem Relation Age of Onset  . Breast cancer Mother 66    blood cancer too  . Brain cancer Paternal Aunt     diagnosed in late 47s to early 76s  . Breast cancer Cousin     paternal cousin diagnosed; diagnosed in her late 5s  The patient's father is alive at age 54. The patient's mother died at the age of 5 from metastatic breast cancer which was diagnosed when she was 59. The patient has 2 brothers and one sister. There is no other history of breast or ovarian cancer in the immediate family.  GYNECOLOGIC HISTORY:  Menarche age 67, first live birth age 78. Her periods were regular until September 2014, when they stopped. She never used birth control pills.  SOCIAL HISTORY: Sue [name] has a bachelor of science degree from [employer] in food science. Her husband Sue [name]. Sue [name]. Sue [name], currently 57-year-old, is also at home. She at tends [place] will. The patient is a Nurse, learning disability.  ADVANCED DIRECTIVES: not in place  HEALTH  MAINTENANCE: History  Substance Use Topics  . Smoking status: Former Smoker    Types: Cigarettes    Quit date: 11/30/1993  . Smokeless tobacco: Never Used  . Alcohol Use: Yes     Comment: very seldom, 1x per week     Colonoscopy: Never  PAP:  Bone density: Never  Lipid panel:  No Known Allergies  Current Outpatient Prescriptions  Medication Sig Dispense Refill  . tamoxifen (NOLVADEX) 20 MG tablet TAKE 1 TABLET BY MOUTH DAILY  90 tablet  1   No current facility-administered medications for this visit.    OBJECTIVE: middle-aged African American woman in no acute distress Filed Vitals:   06/27/14 1223  BP: 144/79  Pulse: 67  Temp: 98.4 F (36.9 C)  Resp: 20     Body mass index is 23.75 kg/(m^2).    ECOG FS: 0 Filed Weights   06/27/14 1223  Weight: 165 lb 8 oz (75.07 kg)    Skin: warm, dry  HEENT: sclerae anicteric, conjunctivae pink, oropharynx clear. No thrush or mucositis.  Lymph Nodes: No cervical or supraclavicular lymphadenopathy  Lungs: clear to auscultation bilaterally, no rales, wheezes, or rhonci  Heart: regular rate and rhythm  Abdomen: round, soft, non tender, positive bowel sounds  Musculoskeletal: No focal spinal tenderness, no peripheral edema  Neuro: non focal, well oriented, positive affect  Breast: left breast status post lumpectomy and radiation, no evidence of disease recurrence, left axilla benign, right breast unremarkable.   LAB RESULTS: Lab Results  Component Value Date   WBC 3.7* 06/26/2014   NEUTROABS 1.7 06/26/2014   HGB 12.4 06/26/2014   HCT 37.4 06/26/2014   MCV 103.0* 06/26/2014   PLT 158 06/26/2014      Chemistry      Component Value Date/Time   NA 140 06/26/2014 1009   NA 138 10/29/2012 0841   K 4.0 06/26/2014 1009   K 3.4* 10/29/2012 0841   CL 108* 05/02/2013 1453   CL 103 10/29/2012 0841   CO2 24 06/26/2014 1009   CO2 24 10/29/2012 0841   BUN 10.2 06/26/2014 1009   BUN 12 10/29/2012 0841   CREATININE 0.8 06/26/2014 1009    CREATININE 0.79 10/29/2012 0841      Component Value Date/Time   CALCIUM 8.8 06/26/2014 1009   CALCIUM 9.6 10/29/2012 0841   ALKPHOS 47 06/26/2014 1009   AST 19 06/26/2014 1009   ALT 10 06/26/2014 1009   BILITOT 0.33 06/26/2014 1009       Lab Results  Component Value Date   LABCA2 8 09/20/2012      STUDIES: Most recent mammogram performed on 05/19/14 was unremarkable  ASSESSMENT: 48 y.o. [place] woman status post left lumpectomy and sentinel lymph node sampling 08/28/2012 for a ductal carcinoma in situ, grade 3, estrogen receptor 100% positive, progesterone receptor negative, with 0 of 2 sentinel lymph nodes sampled involved  (1) there was an area of microinvasion measuring less than a millimeter, too scant for a prognostic panel   (  2) reexcision for margin clearance 10/30/2012 was successful   (3) completed adjuvant radiation 01/23/2013  (4) started tamoxifen March 2014  (5) genetic testing at [facility] pending (Patient has not yet made this appointment)  (6) menstrual periods stopped September 2014   PLAN:  Sue [name] is doing well as far as her breast cancer is concerned. The labs were reviewed in detail with her and were stable. She is tolerating the tamoxifen extremely well besides rare hot flashes. As her period has returned she is not yet a candidate for switching to an aromatase inhibitor.   The plan is to continue the tamoxifen. She will return for labs and evaluation by Dr. Jana Hakim in 6 months. Sue [name] understands and agrees with this plan. She knows the treatment goal in her case is cure. She has been encouraged to call with any issues that may arise before her next visit here.    Marcelino Duster    06/27/2014

## 2014-06-27 NOTE — Telephone Encounter (Signed)
per pof to sch pt appt-sch and gave pt copy of sch °

## 2014-06-30 ENCOUNTER — Ambulatory Visit: Payer: Medicaid Other | Admitting: Nurse Practitioner

## 2014-09-15 ENCOUNTER — Encounter: Payer: Self-pay | Admitting: Nurse Practitioner

## 2014-10-31 NOTE — Telephone Encounter (Signed)
pt cl & left vm in re to appt in 11/2014-cld pt back and left pt message to call us back in re to appt-no detailed message left as to what pt wanted about appt.Adv to call us @ [phone] to discuss

## 2014-11-15 ENCOUNTER — Other Ambulatory Visit: Payer: Self-pay | Admitting: Oncology

## 2014-11-15 DIAGNOSIS — C50412 Malignant neoplasm of upper-outer quadrant of left female breast: Secondary | ICD-10-CM

## 2014-12-04 ENCOUNTER — Telehealth: Payer: Self-pay | Admitting: Oncology

## 2014-12-04 NOTE — Telephone Encounter (Signed)
pt cld & left vm wanting appt times-cld & left pt a message & adv of time & date of appt

## 2015-01-01 ENCOUNTER — Ambulatory Visit (HOSPITAL_BASED_OUTPATIENT_CLINIC_OR_DEPARTMENT_OTHER): Payer: Medicaid Other | Admitting: Oncology

## 2015-01-01 ENCOUNTER — Telehealth: Payer: Self-pay | Admitting: Oncology

## 2015-01-01 ENCOUNTER — Other Ambulatory Visit (HOSPITAL_BASED_OUTPATIENT_CLINIC_OR_DEPARTMENT_OTHER): Payer: Medicaid Other

## 2015-01-01 VITALS — BP 133/83 | HR 74 | Temp 98.1°F | Resp 18 | Ht 70.0 in | Wt 146.5 lb

## 2015-01-01 DIAGNOSIS — C50412 Malignant neoplasm of upper-outer quadrant of left female breast: Secondary | ICD-10-CM

## 2015-01-01 DIAGNOSIS — D630 Anemia in neoplastic disease: Secondary | ICD-10-CM

## 2015-01-01 DIAGNOSIS — D0592 Unspecified type of carcinoma in situ of left breast: Secondary | ICD-10-CM

## 2015-01-01 DIAGNOSIS — Z170 Estrogen receptor positive status [ER+]: Secondary | ICD-10-CM

## 2015-01-01 LAB — CBC WITH DIFFERENTIAL/PLATELET
BASO%: 1 % (ref 0.0–2.0)
Basophils Absolute: 0 10*3/uL (ref 0.0–0.1)
EOS ABS: 0 10*3/uL (ref 0.0–0.5)
EOS%: 0.7 % (ref 0.0–7.0)
HCT: 41.4 % (ref 34.8–46.6)
HGB: 13.3 g/dL (ref 11.6–15.9)
LYMPH#: 1.6 10*3/uL (ref 0.9–3.3)
LYMPH%: 43.1 % (ref 14.0–49.7)
MCH: 34 pg (ref 25.1–34.0)
MCHC: 32.3 g/dL (ref 31.5–36.0)
MCV: 105.2 fL — ABNORMAL HIGH (ref 79.5–101.0)
MONO#: 0.5 10*3/uL (ref 0.1–0.9)
MONO%: 12.9 % (ref 0.0–14.0)
NEUT%: 42.3 % (ref 38.4–76.8)
NEUTROS ABS: 1.6 10*3/uL (ref 1.5–6.5)
PLATELETS: 164 10*3/uL (ref 145–400)
RBC: 3.93 10*6/uL (ref 3.70–5.45)
RDW: 13.5 % (ref 11.2–14.5)
WBC: 3.8 10*3/uL — AB (ref 3.9–10.3)

## 2015-01-01 LAB — COMPREHENSIVE METABOLIC PANEL (CC13)
ALBUMIN: 3.6 g/dL (ref 3.5–5.0)
ALT: 15 U/L (ref 0–55)
ANION GAP: 7 meq/L (ref 3–11)
AST: 22 U/L (ref 5–34)
Alkaline Phosphatase: 70 U/L (ref 40–150)
BUN: 8.2 mg/dL (ref 7.0–26.0)
CHLORIDE: 108 meq/L (ref 98–109)
CO2: 26 meq/L (ref 22–29)
CREATININE: 0.8 mg/dL (ref 0.6–1.1)
Calcium: 9.3 mg/dL (ref 8.4–10.4)
EGFR: >90 mL/min/{1.73_m2} (ref >90)
Glucose: 115 mg/dl (ref 70–140)
POTASSIUM: 3.8 meq/L (ref 3.5–5.1)
Sodium: 141 mEq/L (ref 136–145)
Total Bilirubin: 0.43 mg/dL (ref 0.20–1.20)
Total Protein: 6.8 g/dL (ref 6.4–8.3)

## 2015-01-01 MED ORDER — TAMOXIFEN CITRATE 20 MG PO TABS: 20.0000 mg | ORAL_TABLET | Freq: Every day | ORAL | Status: DC

## 2015-01-01 NOTE — Progress Notes (Signed)
ID: Sue [name]   DOB: 1965-12-21  MR#: [id number]  [id number]#:[id number]  PCP: Frederico Hamman, MD GYN:  SUFanny Skates OTHER MD: Eppie Gibson  CHIEF COMPLAINT: left breast cancer CURRENT THERAPY: Tamoxifen 20mg  daily  BREAST CANCER HISTORY: From the original intake note:  She had a mammogram in January 2013 which revealed some calcifications in the left breast. Spot compression views suggested a benign etiology but a six-month followup was recommended. Followup mammogram September 2013 showed suspicious calcifications biopsy is recommended. 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 MRI scan performed 07/31/2012 showed an area of enhancement consistent DCIS with clip marker present.  Lumpectomy 08/28/2012 was performed. This revealed multiple and close it involved margins. 2 sentinel lymph nodes were removed both of which were negative for malignancy, but one does have isolated tumor cells seen in the subcapsular location. In addition a small tiny focus of microinvasive tumor was felt to be seen as well.  The patient underwent reexcision on 09/10/2012, additional DCIS was seen adjacent to the left medial and lateral margin.   Her subsequent history is as detailed below  INTERVAL HISTORY: [name] returns today for followup of her left breast cancer. She continues on tamoxifen and generally tolerates that well. Hot flashes are not a major issue. She tells me she switched to [place] milk and that took care of it. She does not have a vaginal discharge. She obtains it under very good price.  REVIEW OF SYSTEMS: For the last day of 2 she has developed some sinus symptoms with significant drainage. There has not been any call for fever. She is not taking anything for that. She is working full-time and enjoys her job. She sleeps on 3 pillows. She had some "hair bumps" in the pubic area and also the right armpit. She treated this by keeping the area very clean  using some Neosporin and those problems have essentially resolved. A detailed review of systems today is otherwise noncontributory  PAST MEDICAL HISTORY: Past Medical History  Diagnosis Date  . Wears glasses   . Breast cancer 08/28/12    Left Breast  . S/P radiation therapy 12/06/12 -01/23/13    Left Breast/Axilla / 46 Gy / 23 Fractions with a Boost to Left Breast / 14 Gy / 7 Fractions  . Use of tamoxifen (Nolvadex) march 2014    PAST SURGICAL HISTORY: Past Surgical History  Procedure Laterality Date  . Foot fusion  3/09    ankle rt  . Dilation and curettage of uterus      Following Miscarriage  . Left breast needle core biopsy  07/26/12    UOQ - Ductal Carcinoma In Situ with Necrosis. Microcalcifications Identified  . Left breast lumpectomy  08/28/12  . Re-excision of left breast cancer on 09/10/12    . Re-excision of breast cancer,superior margins  10/30/2012    Procedure: RE-EXCISION OF BREAST CANCER,SUPERIOR MARGINS;  Surgeon: Adin Hector, MD;  Location: [facility];  Service: General;  Laterality: N/A;  left partial mastectomy with excision of margins    FAMILY HISTORY Family History  Problem Relation Age of Onset  . Breast cancer Mother 34    blood cancer too  . Brain cancer Paternal Aunt     diagnosed in late 107s to early 22s  . Breast cancer Cousin     paternal cousin diagnosed; diagnosed in her late 28s  The patient's father is alive at age 70. The patient's mother died at the  age of 41 from metastatic breast cancer which was diagnosed when she was 16. The patient has 2 brothers and one sister. There is no other history of breast or ovarian cancer in the immediate family.   GYNECOLOGIC HISTORY:  Menarche age 73, first live birth age 30. Her periods were regular until September 2014, when they stopped. She never used birth control pills.  SOCIAL HISTORY: Sue [name] has a bachelor of science degree from [employer] in food science. Her husband Micah Flesher. Amedeo Plenty is in Press photographer for  [employer]. [name], currently 2-year-old, is also at home. She at tends [place] will. The patient is a Nurse, learning disability.  ADVANCED DIRECTIVES: not in place  HEALTH MAINTENANCE: History  Substance Use Topics  . Smoking status: Former Smoker    Types: Cigarettes    Quit date: 11/30/1993  . Smokeless tobacco: Never Used  . Alcohol Use: Yes     Comment: very seldom, 1x per week     Colonoscopy: Never  PAP:  Bone density: Never  Lipid panel:  No Known Allergies  Current Outpatient Prescriptions  Medication Sig Dispense Refill  . tamoxifen (NOLVADEX) 20 MG tablet TAKE 1 TABLET BY MOUTH DAILY 90 tablet 0   No current facility-administered medications for this visit.    OBJECTIVE: middle-aged African American woman who appears stated age 30 Vitals:   01/01/15 1205  BP: 133/83  Pulse: 74  Temp: 98.1 F (36.7 C)  Resp: 18     Body mass index is 21.02 kg/(m^2).    ECOG FS: 1 Filed Weights   01/01/15 1205  Weight: 146 lb 8 oz (66.452 kg)    Sclerae unicteric, pupils round and equal Oropharynx clear and moist No cervical or supraclavicular adenopathy Lungs no rales or rhonchi Heart regular rate and rhythm Abd soft, nontender, positive bowel sounds MSK no focal spinal tenderness, no upper extremity lymphedema Neuro: nonfocal, well oriented, appropriate affect Breasts: The right breast is unremarkable. I do not see hidradenitis in the right axilla. The left breast is status post lumpectomy and radiation. The skin hyperpigmentation is subsiding. There is no evidence of local recurrence. The left axilla is benign.  LAB RESULTS: Lab Results  Component Value Date   WBC 3.8* 01/01/2015   NEUTROABS 1.6 01/01/2015   HGB 13.3 01/01/2015   HCT 41.4 01/01/2015   MCV 105.2* 01/01/2015   PLT 164 01/01/2015      Chemistry      Component Value Date/Time   NA 140 06/26/2014 1009   NA 138 10/29/2012 0841   K 4.0 06/26/2014 1009   K 3.4*  10/29/2012 0841   CL 108* 05/02/2013 1453   CL 103 10/29/2012 0841   CO2 24 06/26/2014 1009   CO2 24 10/29/2012 0841   BUN 10.2 06/26/2014 1009   BUN 12 10/29/2012 0841   CREATININE 0.8 06/26/2014 1009   CREATININE 0.79 10/29/2012 0841      Component Value Date/Time   CALCIUM 8.8 06/26/2014 1009   CALCIUM 9.6 10/29/2012 0841   ALKPHOS 47 06/26/2014 1009   AST 19 06/26/2014 1009   ALT 10 06/26/2014 1009   BILITOT 0.33 06/26/2014 1009       Lab Results  Component Value Date   LABCA2 8 09/20/2012      STUDIES: No results found.  ASSESSMENT: 49 y.o. [place] woman status post left lumpectomy and sentinel lymph node sampling 08/28/2012 for a ductal carcinoma in situ, grade 3, estrogen receptor 100% positive, progesterone  receptor negative, with 0 of 2 sentinel lymph nodes sampled involved  (1) there was an area of microinvasion measuring less than a millimeter, too scant for a prognostic panel   (2) reexcision for margin clearance 10/30/2012 was successful   (3) completed adjuvant radiation 01/23/2013  (4) started tamoxifen March 2014  (5) genetic testing at [facility] pending (Patient has not yet made this appointment)  (6) menstrual periods stopped September 2014   PLAN:  Nakya is now more than 2 years out from her definitive surgery for breast cancer, with no evidence of disease recurrence. She is tolerating the tamoxifen well. The plan is to continue tamoxifen most likely for a period of 10 years.  I do not know if her insurance has changed and she may be approved for genetic testing but I will inquire.  Otherwise she will return to see [place] again in 6 months. I suggested she try loratadine and Mucinex for her sinus symptoms. She knows to call for any problems that may develop before her next visit here.   MAGRINAT,GUSTAV C    01/01/2015

## 2015-01-01 NOTE — Telephone Encounter (Signed)
per pof to sch pt appt-gave pt copy of sch °

## 2015-01-01 NOTE — Addendum Note (Signed)
Addended by: Laureen Abrahams on: 01/01/2015 01:36 PM   Modules accepted: Medications

## 2015-05-11 ENCOUNTER — Other Ambulatory Visit: Payer: Self-pay | Admitting: Oncology

## 2015-05-11 DIAGNOSIS — Z853 Personal history of malignant neoplasm of breast: Secondary | ICD-10-CM

## 2015-05-21 ENCOUNTER — Ambulatory Visit
Admission: RE | Admit: 2015-05-21 | Discharge: 2015-05-21 | Disposition: A | Discharge status: Home / Self Care | Payer: Medicaid Other | Source: Ambulatory Visit | Attending: Oncology | Admitting: Oncology

## 2015-05-21 DIAGNOSIS — Z853 Personal history of malignant neoplasm of breast: Secondary | ICD-10-CM

## 2015-07-03 ENCOUNTER — Other Ambulatory Visit: Payer: Self-pay | Admitting: *Deleted

## 2015-07-03 DIAGNOSIS — C50412 Malignant neoplasm of upper-outer quadrant of left female breast: Secondary | ICD-10-CM

## 2015-07-06 ENCOUNTER — Other Ambulatory Visit (HOSPITAL_BASED_OUTPATIENT_CLINIC_OR_DEPARTMENT_OTHER): Payer: Medicaid Other

## 2015-07-06 ENCOUNTER — Ambulatory Visit (HOSPITAL_BASED_OUTPATIENT_CLINIC_OR_DEPARTMENT_OTHER): Payer: Medicaid Other | Admitting: Nurse Practitioner

## 2015-07-06 ENCOUNTER — Encounter: Payer: Self-pay | Admitting: Nurse Practitioner

## 2015-07-06 ENCOUNTER — Other Ambulatory Visit: Payer: Self-pay | Admitting: *Deleted

## 2015-07-06 ENCOUNTER — Telehealth: Payer: Self-pay | Admitting: Oncology

## 2015-07-06 VITALS — BP 118/76 | HR 66 | Temp 97.4°F | Resp 18 | Ht 70.0 in | Wt 147.7 lb

## 2015-07-06 DIAGNOSIS — Z853 Personal history of malignant neoplasm of breast: Secondary | ICD-10-CM | POA: Diagnosis present

## 2015-07-06 DIAGNOSIS — Z79810 Long term (current) use of selective estrogen receptor modulators (SERMs): Secondary | ICD-10-CM | POA: Diagnosis not present

## 2015-07-06 DIAGNOSIS — C50412 Malignant neoplasm of upper-outer quadrant of left female breast: Secondary | ICD-10-CM

## 2015-07-06 LAB — COMPREHENSIVE METABOLIC PANEL (CC13)
ALT: 17 U/L (ref 0–55)
ANION GAP: 9 meq/L (ref 3–11)
AST: 19 U/L (ref 5–34)
Albumin: 3.6 g/dL (ref 3.5–5.0)
Alkaline Phosphatase: 59 U/L (ref 40–150)
BUN: 20.1 mg/dL (ref 7.0–26.0)
CALCIUM: 9.2 mg/dL (ref 8.4–10.4)
CHLORIDE: 109 meq/L (ref 98–109)
CO2: 25 mEq/L (ref 22–29)
CREATININE: 0.8 mg/dL (ref 0.6–1.1)
Glucose: 82 mg/dl (ref 70–140)
Potassium: 3.7 mEq/L (ref 3.5–5.1)
Sodium: 144 mEq/L (ref 136–145)
Total Bilirubin: 0.36 mg/dL (ref 0.20–1.20)
Total Protein: 6.7 g/dL (ref 6.4–8.3)

## 2015-07-06 LAB — CBC WITH DIFFERENTIAL/PLATELET
BASO%: 1 % (ref 0.0–2.0)
BASOS ABS: 0 10*3/uL (ref 0.0–0.1)
EOS ABS: 0 10*3/uL (ref 0.0–0.5)
EOS%: 1.1 % (ref 0.0–7.0)
HEMATOCRIT: 37.5 % (ref 34.8–46.6)
HGB: 12.9 g/dL (ref 11.6–15.9)
LYMPH%: 38 % (ref 14.0–49.7)
MCH: 35.5 pg — AB (ref 25.1–34.0)
MCHC: 34.3 g/dL (ref 31.5–36.0)
MCV: 103.5 fL — AB (ref 79.5–101.0)
MONO#: 0.6 10*3/uL (ref 0.1–0.9)
MONO%: 13 % (ref 0.0–14.0)
NEUT#: 2 10*3/uL (ref 1.5–6.5)
NEUT%: 46.9 % (ref 38.4–76.8)
PLATELETS: 159 10*3/uL (ref 145–400)
RBC: 3.62 10*6/uL — ABNORMAL LOW (ref 3.70–5.45)
RDW: 13 % (ref 11.2–14.5)
WBC: 4.3 10*3/uL (ref 3.9–10.3)

## 2015-07-06 MED ORDER — TAMOXIFEN CITRATE 20 MG PO TABS: 20.0000 mg | ORAL_TABLET | Freq: Every day | ORAL | Status: DC

## 2015-07-06 NOTE — Telephone Encounter (Signed)
Appointments made and avs printed for patient °

## 2015-07-06 NOTE — Progress Notes (Signed)
ID: Sue [name]   DOB: [birth year]  MR#: [id number]  TKZ#:[id number]  PCP: Frederico Hamman, MD GYN:  SUFanny Skates OTHER MD: Eppie Gibson  CHIEF COMPLAINT: left breast cancer CURRENT THERAPY: Tamoxifen 20mg  daily  BREAST CANCER HISTORY: From the original intake note:  She had a mammogram in January 2013 which revealed some calcifications in the left breast. Spot compression views suggested a benign etiology but a six-month followup was recommended. Followup mammogram September 2013 showed suspicious calcifications biopsy is recommended. 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 MRI scan performed 07/31/2012 showed an area of enhancement consistent DCIS with clip marker present.  Lumpectomy 08/28/2012 was performed. This revealed multiple and close it involved margins. 2 sentinel lymph nodes were removed both of which were negative for malignancy, but one does have isolated tumor cells seen in the subcapsular location. In addition a small tiny focus of microinvasive tumor was felt to be seen as well.  The patient underwent reexcision on 09/10/2012, additional DCIS was seen adjacent to the left medial and lateral margin.   Her subsequent history is as detailed below  INTERVAL HISTORY: Sue [name] returns today for follow up of her left breast cancer. She has been on tamoxifen since March 2014 and tolerates this well. She has occasional hot flashes. Her periods returned earlier this year and they are "regular flow" for 4-5 days "like clockwork." She is of course disappointed because she thought she had fully reached menopause. She follow up with her GYN this month for a pap smear. The interval history is otherwise unremarkable. She continues to work out regularly.   REVIEW OF SYSTEMS: A detailed review of systems is otherwise entirely negative, except where noted above.   PAST MEDICAL HISTORY: Past Medical History  Diagnosis Date  . Wears glasses    . Breast cancer 08/28/12    Left Breast  . S/P radiation therapy 12/06/12 -01/23/13    Left Breast/Axilla / 46 Gy / 23 Fractions with a Boost to Left Breast / 14 Gy / 7 Fractions  . Use of tamoxifen (Nolvadex) march 2014    PAST SURGICAL HISTORY: Past Surgical History  Procedure Laterality Date  . Foot fusion  3/09    ankle rt  . Dilation and curettage of uterus      Following Miscarriage  . Left breast needle core biopsy  07/26/12    UOQ - Ductal Carcinoma In Situ with Necrosis. Microcalcifications Identified  . Left breast lumpectomy  08/28/12  . Re-excision of left breast cancer on 09/10/12    . Re-excision of breast cancer,superior margins  10/30/2012    Procedure: RE-EXCISION OF BREAST CANCER,SUPERIOR MARGINS;  Surgeon: Adin Hector, MD;  Location: [facility];  Service: General;  Laterality: N/A;  left partial mastectomy with excision of margins    FAMILY HISTORY Family History  Problem Relation Age of Onset  . Breast cancer Mother 4    blood cancer too  . Brain cancer Paternal Aunt     diagnosed in late 62s to early 88s  . Breast cancer Cousin     paternal cousin diagnosed; diagnosed in her late 48s  The patient's father is alive at age 22. The patient's mother died at the age of 46 from metastatic breast cancer which was diagnosed when she was 76. The patient has 2 brothers and one sister. There is no other history of breast or ovarian cancer in the immediate family.   GYNECOLOGIC HISTORY:  Menarche  age 40, first live birth age 77. Her periods were regular until September 2014, when they stopped. She never used birth control pills.  SOCIAL HISTORY: Sue [name] has a bachelor of science degree from [employer] in food science. Her husband Sue [name]. 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 [name], currently 48-year-old, is also at home. She at tends [place] will. The patient is a Nurse, learning disability.  ADVANCED DIRECTIVES: not in place  HEALTH  MAINTENANCE: Social History  Substance Use Topics  . Smoking status: Former Smoker    Types: Cigarettes    Quit date: 11/30/1993  . Smokeless tobacco: Never Used  . Alcohol Use: Yes     Comment: very seldom, 1x per week     Colonoscopy: Never  PAP:  Bone density: Never  Lipid panel:  No Known Allergies  Current Outpatient Prescriptions  Medication Sig Dispense Refill  . tamoxifen (NOLVADEX) 20 MG tablet Take 1 tablet (20 mg total) by mouth daily. 90 tablet 2   No current facility-administered medications for this visit.    OBJECTIVE: middle-aged African American woman who appears stated age 49 Vitals:   07/06/15 1021  BP: 118/76  Pulse: 66  Temp: 97.4 F (36.3 C)  Resp: 18     Body mass index is 21.19 kg/(m^2).    ECOG FS: 1 Filed Weights   07/06/15 1021  Weight: 147 lb 11.2 oz (66.996 kg)   Skin: warm, dry  HEENT: sclerae anicteric, conjunctivae pink, oropharynx clear. No thrush or mucositis.  Lymph Nodes: No cervical or supraclavicular lymphadenopathy  Lungs: clear to auscultation bilaterally, no rales, wheezes, or rhonci  Heart: regular rate and rhythm  Abdomen: round, soft, non tender, positive bowel sounds  Musculoskeletal: No focal spinal tenderness, no peripheral edema  Neuro: non focal, well oriented, positive affect  Breasts: left breast status post lumpectomy and radiation. No evidence of recurrent disease. Left axilla benign. Right breast unremarkable.  LAB RESULTS: Lab Results  Component Value Date   WBC 4.3 07/06/2015   NEUTROABS 2.0 07/06/2015   HGB 12.9 07/06/2015   HCT 37.5 07/06/2015   MCV 103.5* 07/06/2015   PLT 159 07/06/2015      Chemistry      Component Value Date/Time   NA 144 07/06/2015 1011   NA 138 10/29/2012 0841   K 3.7 07/06/2015 1011   K 3.4* 10/29/2012 0841   CL 108* 05/02/2013 1453   CL 103 10/29/2012 0841   CO2 25 07/06/2015 1011   CO2 24 10/29/2012 0841   BUN 20.1 07/06/2015 1011   BUN 12 10/29/2012 0841    CREATININE 0.8 07/06/2015 1011   CREATININE 0.79 10/29/2012 0841      Component Value Date/Time   CALCIUM 9.2 07/06/2015 1011   CALCIUM 9.6 10/29/2012 0841   ALKPHOS 59 07/06/2015 1011   AST 19 07/06/2015 1011   ALT 17 07/06/2015 1011   BILITOT 0.36 07/06/2015 1011       Lab Results  Component Value Date   LABCA2 8 09/20/2012      STUDIES: No results found. EXAM: DIGITAL DIAGNOSTIC LEFT MAMMOGRAM WITH 3D TOMOSYNTHESIS AND CAD  COMPARISON: Previous exams including 05/19/2014 and 05/15/2013.  ACR Breast Density Category c: The breast tissue is heterogeneously dense, which may obscure small masses.  FINDINGS: There are stable postsurgical changes within the left breast. There are no dominant masses, suspicious calcifications or secondary signs of malignancy within either breast.  Mammographic images were processed with CAD.  IMPRESSION: No evidence of malignancy.  RECOMMENDATION: Routine annual screening mammograms.  I have discussed the findings and recommendations with the patient. Results were also provided in writing at the conclusion of the visit. If applicable, a reminder letter will be sent to the patient regarding the next appointment.  BI-RADS CATEGORY 2: Benign.   Electronically Signed  By: Franki Cabot M.D.  On: 05/21/2015 09:52  ASSESSMENT: 49 y.o. [place] woman status post left lumpectomy and sentinel lymph node sampling 08/28/2012 for a ductal carcinoma in situ, grade 3, estrogen receptor 100% positive, progesterone receptor negative, with 0 of 2 sentinel lymph nodes sampled involved  (1) there was an area of microinvasion measuring less than a millimeter, too scant for a prognostic panel   (2) reexcision for margin clearance 10/30/2012 was successful   (3) completed adjuvant radiation 01/23/2013  (4) started tamoxifen March 2014  (5) genetic testing at [facility] pending (Patient has not yet made this appointment)  (6)  menstrual periods stopped September 2014, returned Spring 2006   PLAN:  Albertha is doing well as far as her breast cancer is concerned. She is now almost 3 years out from her definitive surgery with no evidence of recurrent disease. The labs were reviewed in detail and were entirely stable. She is tolerating the tamoxifen well and will continue this for 10 years of antiestrogen therapy.   She will have a pap smear this month. It seems her periods have resumed for the the time, and this is likely normal bleeding. She will alert [place] if her GYN believes otherwise  Marykatherine will return in 1 year for labs and a follow up visit. She understands and agrees with this plan. She knows the goal of treatment in her case is cure. She has been encouraged to call with any issues that might arise before her next visit here.   Genelle Gather Kiya Eno    07/06/2015

## 2016-07-04 ENCOUNTER — Other Ambulatory Visit: Payer: Self-pay | Admitting: *Deleted

## 2016-07-04 DIAGNOSIS — C50411 Malignant neoplasm of upper-outer quadrant of right female breast: Secondary | ICD-10-CM

## 2016-07-05 ENCOUNTER — Telehealth: Payer: Self-pay | Admitting: Oncology

## 2016-07-05 ENCOUNTER — Ambulatory Visit (HOSPITAL_BASED_OUTPATIENT_CLINIC_OR_DEPARTMENT_OTHER): Payer: Medicaid Other | Admitting: Oncology

## 2016-07-05 ENCOUNTER — Other Ambulatory Visit (HOSPITAL_BASED_OUTPATIENT_CLINIC_OR_DEPARTMENT_OTHER): Payer: Medicaid Other

## 2016-07-05 VITALS — BP 117/83 | HR 57 | Temp 98.5°F | Resp 18 | Ht 70.0 in | Wt 172.9 lb

## 2016-07-05 DIAGNOSIS — Z853 Personal history of malignant neoplasm of breast: Secondary | ICD-10-CM

## 2016-07-05 DIAGNOSIS — Z79810 Long term (current) use of selective estrogen receptor modulators (SERMs): Secondary | ICD-10-CM

## 2016-07-05 DIAGNOSIS — C50411 Malignant neoplasm of upper-outer quadrant of right female breast: Secondary | ICD-10-CM

## 2016-07-05 DIAGNOSIS — C50412 Malignant neoplasm of upper-outer quadrant of left female breast: Secondary | ICD-10-CM

## 2016-07-05 LAB — CBC WITH DIFFERENTIAL/PLATELET
BASO%: 0.8 % (ref 0.0–2.0)
BASOS ABS: 0 10*3/uL (ref 0.0–0.1)
EOS%: 1.3 % (ref 0.0–7.0)
Eosinophils Absolute: 0 10*3/uL (ref 0.0–0.5)
HCT: 38.3 % (ref 34.8–46.6)
HEMOGLOBIN: 12.6 g/dL (ref 11.6–15.9)
LYMPH#: 1.5 10*3/uL (ref 0.9–3.3)
LYMPH%: 48.5 % (ref 14.0–49.7)
MCH: 33 pg (ref 25.1–34.0)
MCHC: 32.8 g/dL (ref 31.5–36.0)
MCV: 100.6 fL (ref 79.5–101.0)
MONO#: 0.4 10*3/uL (ref 0.1–0.9)
MONO%: 12.6 % (ref 0.0–14.0)
NEUT#: 1.2 10*3/uL — ABNORMAL LOW (ref 1.5–6.5)
NEUT%: 36.8 % — ABNORMAL LOW (ref 38.4–76.8)
Platelets: 159 10*3/uL (ref 145–400)
RBC: 3.81 10*6/uL (ref 3.70–5.45)
RDW: 13.8 % (ref 11.2–14.5)
WBC: 3.1 10*3/uL — ABNORMAL LOW (ref 3.9–10.3)

## 2016-07-05 LAB — COMPREHENSIVE METABOLIC PANEL
ALBUMIN: 3.4 g/dL — AB (ref 3.5–5.0)
AST: 14 U/L (ref 5–34)
Alkaline Phosphatase: 58 U/L (ref 40–150)
Anion Gap: 8 mEq/L (ref 3–11)
BUN: 11.6 mg/dL (ref 7.0–26.0)
CHLORIDE: 109 meq/L (ref 98–109)
CO2: 23 mEq/L (ref 22–29)
CREATININE: 0.8 mg/dL (ref 0.6–1.1)
Calcium: 8.7 mg/dL (ref 8.4–10.4)
EGFR: >90 mL/min/{1.73_m2} (ref >90)
GLUCOSE: 74 mg/dL (ref 70–140)
POTASSIUM: 3.7 meq/L (ref 3.5–5.1)
SODIUM: 141 meq/L (ref 136–145)
Total Bilirubin: 0.4 mg/dL (ref 0.20–1.20)
Total Protein: 6.5 g/dL (ref 6.4–8.3)

## 2016-07-05 MED ORDER — TAMOXIFEN CITRATE 20 MG PO TABS: 20.0000 mg | ORAL_TABLET | Freq: Every day | ORAL | 2 refills | Status: DC

## 2016-07-05 NOTE — Progress Notes (Signed)
ID: Sue [name]   DOB: February 12, 1966  MR#: [id number]  [id number]  PCP: Frederico Hamman, MD GYN:  SUFanny Skates OTHER MD: Eppie Gibson  CHIEF COMPLAINT: left breast cancer  CURRENT THERAPY: Tamoxifen   BREAST CANCER HISTORY: From the original intake note:  She had a mammogram in January 2013 which revealed some calcifications in the left breast. Spot compression views suggested a benign etiology but a six-month followup was recommended. Followup mammogram September 2013 showed suspicious calcifications biopsy is recommended. 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 MRI scan performed 07/31/2012 showed an area of enhancement consistent DCIS with clip marker present.  Lumpectomy 08/28/2012 was performed. This revealed multiple and close it involved margins. 2 sentinel lymph nodes were removed both of which were negative for malignancy, but one does have isolated tumor cells seen in the subcapsular location. In addition a small tiny focus of microinvasive tumor was felt to be seen as well.  The patient underwent reexcision on 09/10/2012, additional DCIS was seen adjacent to the left medial and lateral margin.   Her subsequent history is as detailed below  INTERVAL HISTORY: Sue [name] returns today for follow up of her microinvasive left breast cancer. She continues on tamoxifen. She tolerates it well. She still has some hot flashes but they are not a major concern to her. She obtains a drug at a good price.  REVIEW OF SYSTEMS: She is generally doing fine. She exercises very regularly, doing core work, breathing, and making sure she gets her heart rate up and sweats. She tells me work and family are fine. The only concern she has is a little bit of discomfort on the right side of the chest which of course makes her anxious. She says she usually does get anxious before the visit here. A detailed review of systems today was otherwise  stable.  PAST MEDICAL HISTORY: Past Medical History:  Diagnosis Date  . Breast cancer 08/28/12   Left Breast  . S/P radiation therapy 12/06/12 -01/23/13   Left Breast/Axilla / 46 Gy / 23 Fractions with a Boost to Left Breast / 14 Gy / 7 Fractions  . Use of tamoxifen (Nolvadex) march 2014  . Wears glasses     PAST SURGICAL HISTORY: Past Surgical History:  Procedure Laterality Date  . DILATION AND CURETTAGE OF UTERUS     Following Miscarriage  . FOOT FUSION  3/09   ankle rt  . Left Breast Lumpectomy  08/28/12  . 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 Microcalcifications Identified  . RE-EXCISION OF BREAST CANCER,SUPERIOR MARGINS  10/30/2012   Procedure: RE-EXCISION OF BREAST CANCER,SUPERIOR MARGINS;  Surgeon: Adin Hector, MD;  Location: [facility];  Service: General;  Laterality: N/A;  left partial mastectomy with excision of margins  . re-excision of left breast cancer on 09/10/12      FAMILY HISTORY Family History  Problem Relation Age of Onset  . Breast cancer Mother 33    blood cancer too  . Brain cancer Paternal Aunt     diagnosed in late 32s to early 29s  . Breast cancer Cousin     paternal cousin diagnosed; diagnosed in her late 98s  The patient's father is alive at age 15. The patient's mother died at the age of 69 from metastatic breast cancer which was diagnosed when she was 74. The patient has 2 brothers and one sister. There is no  other history of breast or ovarian cancer in the immediate family.   GYNECOLOGIC HISTORY:  Menarche age 52, first live birth age 73. Her periods were regular until September 2014, when they stopped. She never used birth control pills.  SOCIAL HISTORY: Sue has a bachelor of science degree from [employer] in food science. Her husband Micah [name]. Amedeo Plenty is in Press photographer for [employer]. [name], currently 71-year-old, is also at home. She at tends [place] will.  The patient is a Nurse, learning disability.  ADVANCED DIRECTIVES: not in place  HEALTH MAINTENANCE: Social History  Substance Use Topics  . Smoking status: Former Smoker    Types: Cigarettes    Quit date: 11/30/1993  . Smokeless tobacco: Never Used  . Alcohol use Yes     Comment: very seldom, 1x per week     Colonoscopy: Never  PAP:  Bone density: Never  Lipid panel:  No Known Allergies  Current Outpatient Prescriptions  Medication Sig Dispense Refill  . tamoxifen (NOLVADEX) 20 MG tablet Take 1 tablet (20 mg total) by mouth daily. 90 tablet 2   No current facility-administered medications for this visit.     OBJECTIVE: middle-aged African American woman who appears Well Vitals:   07/05/16 1113  BP: 117/83  Pulse: (!) 57  Resp: 18  Temp: 98.5 F (36.9 C)     Body mass index is 24.81 kg/m.    ECOG FS: 0 Filed Weights   07/05/16 1113  Weight: 172 lb 14.4 oz (78.4 kg)   Sclerae unicteric, pupils round and equal Oropharynx clear and moist-- no thrush or other lesions No cervical or supraclavicular adenopathy Lungs no rales or rhonchi Heart regular rate and rhythm Abd soft, nontender, positive bowel sounds MSK no focal spinal tenderness, no upper extremity lymphedema Neuro: nonfocal, well oriented, appropriate affect Breasts: The right breast is unremarkable. The left breast is status post radiation and lumpectomy. There is no evidence of local recurrence. The left axilla is benign.    LAB RESULTS: Lab Results  Component Value Date   WBC 3.1 (L) 07/05/2016   NEUTROABS 1.2 (L) 07/05/2016   HGB 12.6 07/05/2016   HCT 38.3 07/05/2016   MCV 100.6 07/05/2016   PLT 159 07/05/2016      Chemistry      Component Value Date/Time   NA 144 07/06/2015 1011   K 3.7 07/06/2015 1011   CL 108 (H) 05/02/2013 1453   CO2 25 07/06/2015 1011   BUN 20.1 07/06/2015 1011   CREATININE 0.8 07/06/2015 1011      Component Value Date/Time   CALCIUM 9.2 07/06/2015 1011   ALKPHOS 59 07/06/2015  1011   AST 19 07/06/2015 1011   ALT 17 07/06/2015 1011   BILITOT 0.36 07/06/2015 1011       Lab Results  Component Value Date   LABCA2 8 09/20/2012      STUDIES: No results found.  ASSESSMENT: 50 y.o. [place] woman status post left breast upper outer quadrant lumpectomy and sentinel lymph node sampling 08/28/2012 for ductal carcinoma in situ, grade 3, estrogen receptor 100% positive, progesterone receptor negative, with 0 of 2 sentinel lymph nodes sampled involved  (1) there was an area of microinvasion measuring less than a millimeter, too scant for a prognostic panel   (2) reexcision for margin clearance 10/30/2012 was successful   (3) completed adjuvant radiation 01/23/2013  (4) started tamoxifen March 2014  (5) genetic testing at [facility] pending (Patient has not yet made  this appointment)  (6) menstrual periods stopped September 2014, resumedSpring 2016   PLAN:  Eshanti is now nearly 4 years out from definitive surgery for her microinvasive breast cancer, with no evidence of disease recurrence. This is very favorable.  He is tolerating tamoxifen well. The plan is to continue that through March 2019.  She will see me again in one year. She will have lab work and physical exam at that time. She knows to call for any problems that may develop before that visit.  Brynlee Pennywell C    07/05/2016

## 2016-07-05 NOTE — Telephone Encounter (Signed)
appt made and avs printed °

## 2017-04-27 ENCOUNTER — Telehealth: Payer: Self-pay

## 2017-04-27 NOTE — Telephone Encounter (Signed)
Pt called for when her last MM was done so she can schedule her next MM. 05/21/15 was last MM

## 2017-05-16 ENCOUNTER — Ambulatory Visit: Payer: Self-pay | Admitting: Obstetrics

## 2017-06-08 ENCOUNTER — Other Ambulatory Visit: Payer: Self-pay | Admitting: Oncology

## 2017-06-08 DIAGNOSIS — Z1231 Encounter for screening mammogram for malignant neoplasm of breast: Secondary | ICD-10-CM

## 2017-06-12 ENCOUNTER — Ambulatory Visit: Payer: Self-pay | Admitting: Obstetrics

## 2017-06-24 ENCOUNTER — Emergency Department (HOSPITAL_COMMUNITY)
Admission: EM | Admit: 2017-06-24 | Discharge: 2017-06-24 | Disposition: A | Discharge status: Home / Self Care | Payer: Medicaid Other | Attending: Emergency Medicine | Admitting: Emergency Medicine

## 2017-06-24 ENCOUNTER — Encounter (HOSPITAL_COMMUNITY): Payer: Self-pay | Admitting: Emergency Medicine

## 2017-06-24 DIAGNOSIS — L02415 Cutaneous abscess of right lower limb: Secondary | ICD-10-CM | POA: Diagnosis not present

## 2017-06-24 DIAGNOSIS — Z79899 Other long term (current) drug therapy: Secondary | ICD-10-CM | POA: Diagnosis not present

## 2017-06-24 DIAGNOSIS — L03115 Cellulitis of right lower limb: Secondary | ICD-10-CM | POA: Diagnosis not present

## 2017-06-24 DIAGNOSIS — Z853 Personal history of malignant neoplasm of breast: Secondary | ICD-10-CM | POA: Insufficient documentation

## 2017-06-24 DIAGNOSIS — Z87891 Personal history of nicotine dependence: Secondary | ICD-10-CM | POA: Insufficient documentation

## 2017-06-24 DIAGNOSIS — M79604 Pain in right leg: Secondary | ICD-10-CM | POA: Diagnosis present

## 2017-06-24 LAB — COMPREHENSIVE METABOLIC PANEL
ALBUMIN: 4.1 g/dL (ref 3.5–5.0)
ALK PHOS: 66 U/L (ref 38–126)
ALT: 12 U/L — AB (ref 14–54)
ANION GAP: 10 (ref 5–15)
AST: 23 U/L (ref 15–41)
BILIRUBIN TOTAL: 0.8 mg/dL (ref 0.3–1.2)
BUN: 16 mg/dL (ref 6–20)
CALCIUM: 9.6 mg/dL (ref 8.9–10.3)
CO2: 24 mmol/L (ref 22–32)
CREATININE: 0.85 mg/dL (ref 0.44–1.00)
Chloride: 103 mmol/L (ref 101–111)
GFR calc Af Amer: >60 mL/min (ref >60)
GFR calc non Af Amer: >60 mL/min (ref >60)
GLUCOSE: 94 mg/dL (ref 65–99)
Potassium: 3.8 mmol/L (ref 3.5–5.1)
Sodium: 137 mmol/L (ref 135–145)
TOTAL PROTEIN: 7.5 g/dL (ref 6.5–8.1)

## 2017-06-24 LAB — CBC WITH DIFFERENTIAL/PLATELET
BASOS ABS: 0 10*3/uL (ref 0.0–0.1)
BASOS PCT: 0 %
EOS ABS: 0 10*3/uL (ref 0.0–0.7)
EOS PCT: 0 %
HCT: 38.9 % (ref 36.0–46.0)
Hemoglobin: 12.8 g/dL (ref 12.0–15.0)
Lymphocytes Relative: 19 %
Lymphs Abs: 1.5 10*3/uL (ref 0.7–4.0)
MCH: 32.2 pg (ref 26.0–34.0)
MCHC: 32.9 g/dL (ref 30.0–36.0)
MCV: 98 fL (ref 78.0–100.0)
MONO ABS: 0.5 10*3/uL (ref 0.1–1.0)
Monocytes Relative: 6 %
Neutro Abs: 6.3 10*3/uL (ref 1.7–7.7)
Neutrophils Relative %: 75 %
PLATELETS: 182 10*3/uL (ref 150–400)
RBC: 3.97 MIL/uL (ref 3.87–5.11)
RDW: 13.8 % (ref 11.5–15.5)
WBC: 8.3 10*3/uL (ref 4.0–10.5)

## 2017-06-24 MED ORDER — CEPHALEXIN 250 MG PO CAPS
1000.0000 mg | ORAL_CAPSULE | Freq: Once | ORAL | Status: AC
  Administered 2017-06-24: 1000 mg via ORAL
  Filled 2017-06-24: qty 4

## 2017-06-24 MED ORDER — ACETAMINOPHEN 500 MG PO TABS
1000.0000 mg | ORAL_TABLET | Freq: Once | ORAL | Status: AC
  Administered 2017-06-24: 1000 mg via ORAL
  Filled 2017-06-24: qty 2

## 2017-06-24 MED ORDER — CEPHALEXIN 500 MG PO CAPS: 500.0000 mg | ORAL_CAPSULE | Freq: Three times a day (TID) | ORAL | 0 refills | Status: DC

## 2017-06-24 MED ORDER — LIDOCAINE-EPINEPHRINE (PF) 2 %-1:200000 IJ SOLN
10.0000 mL | Freq: Once | INTRAMUSCULAR | Status: AC
  Administered 2017-06-24: 10 mL
  Filled 2017-06-24: qty 20

## 2017-06-24 NOTE — ED Notes (Signed)
Pt called for room, no response. 

## 2017-06-24 NOTE — ED Provider Notes (Addendum)
[place] DEPT Provider Note   CSN: [id number] Arrival date & time: 06/24/17  1615     History   Chief Complaint Chief Complaint  Patient presents with  . Leg Pain  . Cellulitis    HPI Sue [name] is a 51 y.o. female.  Patient c/o red, swollen area to lateral aspect right lower leg above ankle for past week. Started as small red area. Did not see a bite or sting. Spreading redness since. Pain constant, dull, moderate, non radiating. No foot numbness. Does not feel sick or ill otherwise. No nausea or vomiting. No fever or chills.    The history is provided by the patient.  Leg Pain      Past Medical History:  Diagnosis Date  . Breast cancer ([place]) 08/28/12   Left Breast  . S/P radiation therapy 12/06/12 -01/23/13   Left Breast/Axilla / 46 Gy / 23 Fractions with a Boost to Left Breast / 14 Gy / 7 Fractions  . Use of tamoxifen (Nolvadex) march 2014  . Wears glasses     Patient Active Problem List   Diagnosis Date Noted  . Breast cancer of upper-outer quadrant of left female breast ([place]) 12/17/2013  . Mass of right axilla 05/02/2013  . Anemia in neoplastic disease 05/02/2013    Past Surgical History:  Procedure Laterality Date  . DILATION AND CURETTAGE OF UTERUS     Following Miscarriage  . FOOT FUSION  3/09   ankle rt  . Left Breast Lumpectomy  08/28/12  . 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 Microcalcifications Identified  . RE-EXCISION OF BREAST CANCER,SUPERIOR MARGINS  10/30/2012   Procedure: RE-EXCISION OF BREAST CANCER,SUPERIOR MARGINS;  Surgeon: Adin Hector, MD;  Location: [facility];  Service: General;  Laterality: N/A;  left partial mastectomy with excision of margins  . re-excision of left breast cancer on 09/10/12      OB History    Gravida Para Term Preterm AB Living   2 1           SAB TAB Ectopic Multiple Live Births                   Home Medications    Prior to Admission  medications   Medication Sig Start Date End Date Taking? Authorizing Provider  tamoxifen (NOLVADEX) 20 MG tablet Take 1 tablet (20 mg total) by mouth daily. 07/05/16   Magrinat, Virgie Dad, MD    Family History Family History  Problem Relation Age of Onset  . Breast cancer Mother 15       blood cancer too  . Breast cancer Cousin        paternal cousin diagnosed; diagnosed in her late 76s  . Brain cancer Paternal Aunt        diagnosed in late 54s to early 27s    Social History Social History  Substance Use Topics  . Smoking status: Former Smoker    Types: Cigarettes    Quit date: 11/30/1993  . Smokeless tobacco: Never Used  . Alcohol use Yes     Comment: very seldom, 1x per week     Allergies   Patient has no known allergies.   Review of Systems Review of Systems  Constitutional: Negative for chills and fever.  HENT: Negative for sore throat.   Respiratory: Negative for shortness of breath.   Cardiovascular: Negative for chest pain.  Gastrointestinal: Negative for vomiting.  Skin:  Negative for rash.  Neurological: Negative for headaches.     Physical Exam Updated Vital Signs BP 140/88 (BP Location: Right Arm)   Pulse 86   Temp 98.3 F (36.8 C) (Oral)   Resp 18   SpO2 100%   Physical Exam  Constitutional: She appears well-developed and well-nourished. No distress.  HENT:  Head: Atraumatic.  Eyes: Conjunctivae are normal. No scleral icterus.  Neck: Neck supple. No tracheal deviation present.  Cardiovascular: Normal rate and intact distal pulses.   Pulmonary/Chest: Effort normal. No respiratory distress.  Abdominal: Normal appearance.  Musculoskeletal:  2-3 cm abscess lateral aspect right lower leg, with associated cellulitis. No crepitus. Distal pulses palp. No calf pain, swelling or tenderness. No pain w rom at knee or ankle.   Neurological: She is alert.  Skin: Skin is warm and dry. No rash noted. She is not diaphoretic.  Psychiatric: She has a normal mood  and affect.  Nursing note and vitals reviewed.    ED Treatments / Results  Labs (all labs ordered are listed, but only abnormal results are displayed) Results for orders placed or performed during the hospital encounter of 06/24/17  Comprehensive metabolic panel  Result Value Ref Range   Sodium 137 135 - 145 mmol/L   Potassium 3.8 3.5 - 5.1 mmol/L   Chloride 103 101 - 111 mmol/L   CO2 24 22 - 32 mmol/L   Glucose, Bld 94 65 - 99 mg/dL   BUN 16 6 - 20 mg/dL   Creatinine, Ser 0.85 0.44 - 1.00 mg/dL   Calcium 9.6 8.9 - 10.3 mg/dL   Total Protein 7.5 6.5 - 8.1 g/dL   Albumin 4.1 3.5 - 5.0 g/dL   AST 23 15 - 41 U/L   ALT 12 (L) 14 - 54 U/L   Alkaline Phosphatase 66 38 - 126 U/L   Total Bilirubin 0.8 0.3 - 1.2 mg/dL   GFR calc non Af Amer >60 >60 mL/min   GFR calc Af Amer >60 >60 mL/min   Anion gap 10 5 - 15  CBC with Differential  Result Value Ref Range   WBC 8.3 4.0 - 10.5 K/uL   RBC 3.97 3.87 - 5.11 MIL/uL   Hemoglobin 12.8 12.0 - 15.0 g/dL   HCT 38.9 36.0 - 46.0 %   MCV 98.0 78.0 - 100.0 fL   MCH 32.2 26.0 - 34.0 pg   MCHC 32.9 30.0 - 36.0 g/dL   RDW 13.8 11.5 - 15.5 %   Platelets 182 150 - 400 K/uL   Neutrophils Relative % 75 %   Neutro Abs 6.3 1.7 - 7.7 K/uL   Lymphocytes Relative 19 %   Lymphs Abs 1.5 0.7 - 4.0 K/uL   Monocytes Relative 6 %   Monocytes Absolute 0.5 0.1 - 1.0 K/uL   Eosinophils Relative 0 %   Eosinophils Absolute 0.0 0.0 - 0.7 K/uL   Basophils Relative 0 %   Basophils Absolute 0.0 0.0 - 0.1 K/uL    EKG  EKG Interpretation None       Radiology No results found.  Procedures .Marland KitchenIncision and Drainage Date/Time: 06/24/2017 6:47 PM Performed by: Lajean Saver Authorized by: Lajean Saver   Consent:    Consent obtained:  Verbal   Consent given by:  Patient Location:    Type:  Abscess   Size:  3 cm   Location:  Lower extremity   Lower extremity location:  Leg   Leg location:  R lower leg Pre-procedure details:  Skin preparation:   Betadine Anesthesia (see MAR for exact dosages):    Anesthesia method:  Local infiltration   Local anesthetic:  Lidocaine 2% WITH epi Procedure type:    Complexity:  Complex Procedure details:    Incision types:  Elliptical   Scalpel blade:  11   Wound management:  Probed and deloculated and irrigated with saline   Drainage:  Purulent   Drainage amount:  Moderate   Wound treatment:  Wound left open and drain placed   Packing materials:  1/4 in iodoform gauze Post-procedure details:    Patient tolerance of procedure:  Tolerated well, no immediate complications   (including critical care time)  Medications Ordered in ED Medications  lidocaine-EPINEPHrine (XYLOCAINE W/EPI) 2 %-1:200000 (PF) injection 10 mL (not administered)  cephALEXin (KEFLEX) capsule 1,000 mg (not administered)  acetaminophen (TYLENOL) tablet 1,000 mg (not administered)     Initial Impression / Assessment and Plan / ED Course  I have reviewed the triage vital signs and the nursing notes.  Pertinent labs & imaging results that were available during my care of the patient were reviewed by me and considered in my medical decision making (see chart for details).  I and D of abscess. Sterile dressing.  Confirmed nkda. Keflex po. No meds today at home. Acetaminophen po.    Reviewed nursing notes and prior charts for additional history.     Final Clinical Impressions(s) / ED Diagnoses   Final diagnoses:  None    New Prescriptions New Prescriptions   No medications on file        Lajean Saver, MD 06/24/17 [phone]

## 2017-06-24 NOTE — ED Triage Notes (Signed)
Pt to ER for evaluation of right ankle pain and swelling extending into the right foot. States onset one week ago. Redness and warmth noted. States painful to palpation. Denies fevers.

## 2017-06-24 NOTE — Discharge Instructions (Signed)
It was our pleasure to provide your ER care today - we hope that you feel better.  Take antibiotic as prescribed.  Have packing removed, your doctor or urgent care, in 2 days time.  Return to ER if worse, high fevers, spreading redness, severe pain, other concern.

## 2017-06-27 ENCOUNTER — Ambulatory Visit: Payer: Medicaid Other

## 2017-06-28 ENCOUNTER — Ambulatory Visit (HOSPITAL_COMMUNITY)
Admission: EM | Admit: 2017-06-28 | Discharge: 2017-06-28 | Disposition: A | Discharge status: Home / Self Care | Payer: Medicaid Other | Attending: Family Medicine | Admitting: Family Medicine

## 2017-06-28 ENCOUNTER — Ambulatory Visit: Payer: Medicaid Other

## 2017-06-28 ENCOUNTER — Encounter (HOSPITAL_COMMUNITY): Payer: Self-pay | Admitting: Physician Assistant

## 2017-06-28 DIAGNOSIS — L03115 Cellulitis of right lower limb: Secondary | ICD-10-CM | POA: Diagnosis not present

## 2017-06-28 DIAGNOSIS — Z5189 Encounter for other specified aftercare: Secondary | ICD-10-CM | POA: Diagnosis not present

## 2017-06-28 NOTE — ED Provider Notes (Signed)
[facility]    CSN: [id number] Arrival date & time: 06/28/17  4481     History   Chief Complaint Chief Complaint  Patient presents with  . Wound Check    HPI Sue [name] is a 51 y.o. female.   HPI 51 year old female with PMH of breast cancer s/p radiation therapy on tamoxifen comes in for wound check after I&D and cellulitis treatment 4 days ago at the ED. Patient was prescribed keflex, Patient states has been taking as directed. Swelling of the ankle has since decreased, but swelling of the foot remains. Has been able to walk more comfortably due to the decreased swelling. Denies fever, chills, night sweats. Denies spreading erythema, increased warmth, discharge. Feels occasional "twinges", but now constant pain, no increased pain.  Past Medical History:  Diagnosis Date  . Breast cancer ([place]) 08/28/12   Left Breast  . S/P radiation therapy 12/06/12 -01/23/13   Left Breast/Axilla / 46 Gy / 23 Fractions with a Boost to Left Breast / 14 Gy / 7 Fractions  . Use of tamoxifen (Nolvadex) march 2014  . Wears glasses     Patient Active Problem List   Diagnosis Date Noted  . Breast cancer of upper-outer quadrant of left female breast ([place]) 12/17/2013  . Mass of right axilla 05/02/2013  . Anemia in neoplastic disease 05/02/2013    Past Surgical History:  Procedure Laterality Date  . DILATION AND CURETTAGE OF UTERUS     Following Miscarriage  . FOOT FUSION  3/09   ankle rt  . Left Breast Lumpectomy  08/28/12  . 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 Microcalcifications Identified  . RE-EXCISION OF BREAST CANCER,SUPERIOR MARGINS  10/30/2012   Procedure: RE-EXCISION OF BREAST CANCER,SUPERIOR MARGINS;  Surgeon: Adin Hector, MD;  Location: [facility];  Service: General;  Laterality: N/A;  left partial mastectomy with excision of margins  . re-excision of left breast cancer on 09/10/12      OB History    Gravida Para  Term Preterm AB Living   2 1           SAB TAB Ectopic Multiple Live Births                   Home Medications    Prior to Admission medications   Medication Sig Start Date End Date Taking? Authorizing Provider  cephALEXin (KEFLEX) 500 MG capsule Take 1 capsule (500 mg total) by mouth 3 (three) times daily. 06/24/17   Lajean Saver, MD  tamoxifen (NOLVADEX) 20 MG tablet Take 1 tablet (20 mg total) by mouth daily. 07/05/16   Magrinat, Virgie Dad, MD    Family History Family History  Problem Relation Age of Onset  . Breast cancer Mother 52       blood cancer too  . Breast cancer Cousin        paternal cousin diagnosed; diagnosed in her late 59s  . Brain cancer Paternal Aunt        diagnosed in late 95s to early 39s    Social History Social History  Substance Use Topics  . Smoking status: Former Smoker    Types: Cigarettes    Quit date: 11/30/1993  . Smokeless tobacco: Never Used  . Alcohol use Yes     Comment: very seldom, 1x per week     Allergies   Patient has no known allergies.   Review of Systems  Review of Systems  Reason unable to perform ROS: See HPI as above.     Physical Exam  Updated Vital Signs BP 132/78 (BP Location: Right Arm)   Pulse 78   Temp 98.6 F (37 C) (Oral)   Resp 14   SpO2 100%    Physical Exam  Constitutional: She appears well-developed and well-nourished. No distress.  HENT:  Head: Normocephalic and atraumatic.  Musculoskeletal:  Full ROM of ankle. Swelling of the right foot remains. Sensation intact, pedal pulses 2+  Skin: Skin is warm and dry.  Elliptical wound incision on distal right lateral lower extremity, directly above ankle. No increased edema, increased warmth, spreading erythema.      UC Treatments / Results  Labs (all labs ordered are listed, but only abnormal results are displayed) Labs Reviewed - No data to display  EKG  EKG Interpretation None       Radiology No results  found.  Procedures Procedures (including critical care time)  Medications Ordered in UC Medications - No data to display   Initial Impression / Assessment and Plan / UC Course  I have reviewed the triage vital signs and the nursing notes.  Pertinent labs & imaging results that were available during my care of the patient were reviewed by me and considered in my medical decision making (see chart for details).    Patient with 1cm x 0.5cm elliptical incision site from I&D that is healing well. Given patient with history of breast cancer s/p radiation therapy on tamoxifen, with continued swelling of right foot, wound cleaned and repacked. Patient to continue Keflex as directed. Follow up in 3-4 days for wound recheck. Return precautions given.     Final Clinical Impressions(s) / UC Diagnoses   Final diagnoses:  Encounter for wound re-check    New Prescriptions Discharge Medication List as of 06/28/2017 10:34 AM        Ok Edwards, PA-C 06/28/17 1045

## 2017-06-28 NOTE — Discharge Instructions (Signed)
Wound is healing well, without spreading of skin infection. Your wound was repacked and redressed today. Given continued swelling, follow up in 3-4 days for wound recheck. Continue Keflex as directed. Monitor for any worsening of symptoms, increased pain, increased swelling, spreading redness/warmth, fever to follow-up for reevaluation.

## 2017-06-28 NOTE — ED Triage Notes (Signed)
Seen  Er  5  Days   Ago  Had  I  And   D   Here     Today   For  packing  Removal   Wound  check

## 2017-06-30 ENCOUNTER — Other Ambulatory Visit (HOSPITAL_COMMUNITY)
Admission: RE | Admit: 2017-06-30 | Discharge: 2017-06-30 | Disposition: A | Discharge status: Home / Self Care | Payer: Medicaid Other | Source: Ambulatory Visit | Attending: Obstetrics | Admitting: Obstetrics

## 2017-06-30 ENCOUNTER — Encounter: Payer: Self-pay | Admitting: Obstetrics

## 2017-06-30 ENCOUNTER — Ambulatory Visit (INDEPENDENT_AMBULATORY_CARE_PROVIDER_SITE_OTHER): Payer: Medicaid Other | Admitting: Obstetrics

## 2017-06-30 VITALS — BP 107/70 | HR 67 | Ht 70.0 in | Wt 177.2 lb

## 2017-06-30 DIAGNOSIS — Z0000 Encounter for general adult medical examination without abnormal findings: Secondary | ICD-10-CM | POA: Diagnosis not present

## 2017-06-30 DIAGNOSIS — Z853 Personal history of malignant neoplasm of breast: Secondary | ICD-10-CM

## 2017-06-30 DIAGNOSIS — Z01419 Encounter for gynecological examination (general) (routine) without abnormal findings: Secondary | ICD-10-CM | POA: Insufficient documentation

## 2017-06-30 DIAGNOSIS — N898 Other specified noninflammatory disorders of vagina: Secondary | ICD-10-CM | POA: Diagnosis not present

## 2017-06-30 DIAGNOSIS — A5901 Trichomonal vulvovaginitis: Secondary | ICD-10-CM | POA: Diagnosis not present

## 2017-06-30 DIAGNOSIS — N951 Menopausal and female climacteric states: Secondary | ICD-10-CM

## 2017-06-30 NOTE — Progress Notes (Signed)
Subjective:        Sue [name] is a 51 y.o. female here for a routine exam.  Current complaints: Hot flushes.  Has history of Breast CA.  S/P radiation and surgery.  Now on Tamoxifen.    Personal health questionnaire:  Is patient Ashkenazi Jewish, have a family history of breast and/or ovarian cancer: yes Is there a family history of uterine cancer diagnosed at age < 86, gastrointestinal cancer, urinary tract cancer, family member who is a Field seismologist syndrome-associated carrier: no Is the patient overweight and hypertensive, family history of diabetes, personal history of gestational diabetes, preeclampsia or PCOS: no Is patient over 60, have PCOS,  family history of premature CHD under age 84, diabetes, smoke, have hypertension or peripheral artery disease:  no At any time, has a partner hit, kicked or otherwise hurt or frightened you?: no Over the past 2 weeks, have you felt down, depressed or hopeless?: no Over the past 2 weeks, have you felt little interest or pleasure in doing things?:no   Gynecologic History Patient's last menstrual period was 11/28/2016. Contraception: post menopausal status Last Pap: 2017. Results were: normal Last mammogram: 2017. Results were: normal  Obstetric History OB History  Gravida Para Term Preterm AB Living  2 1          SAB TAB Ectopic Multiple Live Births               # Outcome Date GA Lbr Len/2nd Weight Sex Delivery Anes PTL Lv  2 Gravida           1 Para               Past Medical History:  Diagnosis Date  . Breast cancer ([place]) 08/28/12   Left Breast  . S/P radiation therapy 12/06/12 -01/23/13   Left Breast/Axilla / 46 Gy / 23 Fractions with a Boost to Left Breast / 14 Gy / 7 Fractions  . Use of tamoxifen (Nolvadex) march 2014  . Wears glasses     Past Surgical History:  Procedure Laterality Date  . DILATION AND CURETTAGE OF UTERUS     Following Miscarriage  . FOOT FUSION  3/09   ankle rt  . Left Breast Lumpectomy  08/28/12   . 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 Microcalcifications Identified  . RE-EXCISION OF BREAST CANCER,SUPERIOR MARGINS  10/30/2012   Procedure: RE-EXCISION OF BREAST CANCER,SUPERIOR MARGINS;  Surgeon: Adin Hector, MD;  Location: [facility];  Service: General;  Laterality: N/A;  left partial mastectomy with excision of margins  . re-excision of left breast cancer on 09/10/12       Current Outpatient Prescriptions:  .  cephALEXin (KEFLEX) 500 MG capsule, Take 1 capsule (500 mg total) by mouth 3 (three) times daily., Disp: 21 capsule, Rfl: 0 .  tamoxifen (NOLVADEX) 20 MG tablet, Take 1 tablet (20 mg total) by mouth daily., Disp: 90 tablet, Rfl: 2 No Known Allergies  Social History  Substance Use Topics  . Smoking status: Former Smoker    Types: Cigarettes    Quit date: 11/30/1993  . Smokeless tobacco: Never Used  . Alcohol use Yes     Comment: very seldom, 1x per week    Family History  Problem Relation Age of Onset  . Breast cancer Mother 65       blood cancer too  . Breast cancer Cousin        paternal  cousin diagnosed; diagnosed in her late 74s  . Brain cancer Paternal Aunt        diagnosed in late 33s to early 78s      Review of Systems  Constitutional: negative for fatigue and weight loss Respiratory: negative for cough and wheezing Cardiovascular: negative for chest pain, fatigue and palpitations Gastrointestinal: negative for abdominal pain and change in bowel habits Musculoskeletal:negative for myalgias Neurological: negative for gait problems and tremors Behavioral/Psych: negative for abusive relationship, depression Endocrine: positive for hot flushes Genitourinary:negative for vaginal irritation or discharge, pelvic pain Integument/breast: negative for breast lump, breast tenderness, nipple discharge and skin lesion(s)    Objective:       BP 107/70   Pulse 67   Ht 5\' 10"  (1.778 m)   Wt 177 lb 3.2 oz (80.4  kg)   LMP 11/28/2016   BMI 25.43 kg/m  General:   alert and no distress  Skin:   no rash or abnormalities  Lungs:   clear to auscultation bilaterally  Heart:   regular rate and rhythm, S1, S2 normal, no murmur, click, rub or gallop  Breasts:   normal without suspicious masses, skin or nipple changes or axillary nodes  Abdomen:  normal findings: no organomegaly, soft, non-tender and no hernia  Pelvis:  External genitalia: normal general appearance Urinary system: urethral meatus normal and bladder without fullness, nontender Vaginal: normal without tenderness, induration or masses Cervix: normal appearance Adnexa: normal bimanual exam Uterus: anteverted and non-tender, normal size   Lab Review Urine pregnancy test Labs reviewed yes Radiologic studies reviewed yes  50% of 20 min visit spent on counseling and coordination of care.    Assessment and Plan:     1. Encounter for routine gynecological examination with Papanicolaou smear of cervix Rx: - Cytology - PAP  2. Perimenopause - doing well except for hot flushes  3. Vaginal discharge Rx:  - Cervicovaginal ancillary only  4. History of breast cancer - in remission after surgery ( lumpectomy left breast ) and radiation, and now on Tamoxifen   Plan:    Education reviewed: calcium supplements, depression evaluation, low fat, low cholesterol diet, safe sex/STD prevention, self breast exams and weight bearing exercise. Contraception: post menopausal status. Follow up in: 1 year.   No orders of the defined types were placed in this encounter.  No orders of the defined types were placed in this encounter.     Patient is in the office for annual exam, states last pap was 1 year ago normal, pt declines std testing.

## 2017-07-03 ENCOUNTER — Ambulatory Visit (HOSPITAL_COMMUNITY)
Admission: EM | Admit: 2017-07-03 | Discharge: 2017-07-03 | Disposition: A | Discharge status: Home / Self Care | Payer: Medicaid Other | Attending: Emergency Medicine | Admitting: Emergency Medicine

## 2017-07-03 ENCOUNTER — Encounter (HOSPITAL_COMMUNITY): Payer: Self-pay | Admitting: Emergency Medicine

## 2017-07-03 DIAGNOSIS — L03115 Cellulitis of right lower limb: Secondary | ICD-10-CM | POA: Diagnosis not present

## 2017-07-03 DIAGNOSIS — Z5189 Encounter for other specified aftercare: Secondary | ICD-10-CM

## 2017-07-03 MED ORDER — CEPHALEXIN 500 MG PO CAPS: 500.0000 mg | ORAL_CAPSULE | Freq: Three times a day (TID) | ORAL | 0 refills | Status: DC

## 2017-07-03 NOTE — ED Provider Notes (Signed)
  [place]   [id number] 07/03/17 Arrival Time: 1652   SUBJECTIVE:  Sue [name] is a 51 y.o. female who presents to the urgent care  with complaint of wound check. She has a history of cancer and is taking tamoxifen, she was seen approximately 10 days ago in the [facility] emergency room and had an incision and drainage performed on her lower leg. She was started on Keflex, and the wound was packed. She returned to the urgent care 5 days ago for wound check, Keflex was extended and the wound was repacked, and is here today for an additional evaluation. She reports pain is reduced, redness and swelling is decreased as well, she has no fever, no chills, no nausea or vomiting. Otherwise states symptoms are improving.  ROS: As per HPI, remainder of ROS negative.   OBJECTIVE:  Vitals:   07/03/17 1721  BP: 137/84  Pulse: 69  Temp: 98.3 F (36.8 C)  TempSrc: Oral  SpO2: 100%     General appearance: alert; no distress HEENT: normocephalic; atraumatic; conjunctivae normal;  Neck: Trachea midline no JVD noted Lungs: clear to auscultation bilaterally Heart: regular rate and rhythm Musculoskeletal: Full range of motion around the ankle, no erythema around the wound, the underlying tissue is red, appears like granulation tissue is present, however there is still some slight purulence around the edges Skin: warm and dry Neurologic: Grossly normal Psychological:  alert and cooperative; normal mood and affect     ASSESSMENT & PLAN:  1. Visit for wound check     Meds ordered this encounter  Medications  . cephALEXin (KEFLEX) 500 MG capsule    Sig: Take 1 capsule (500 mg total) by mouth 3 (three) times daily.    Dispense:  15 capsule    Refill:  0    Order Specific Question:   Supervising Provider    Answer:   Robyn Haber [[id number]]    Wound was repacked with iodoform gauze, and wound care provided. When asked in the Keflex, return to clinic in 4-5 days for  recheck.  Reviewed expectations re: course of current medical issues. Questions answered. Outlined signs and symptoms indicating need for more acute intervention. Patient verbalized understanding. After Visit Summary given.    Procedures:     Labs Reviewed - No data to display  No results found.  No Known Allergies  PMHx, SurgHx, SocialHx, Medications, and Allergies were reviewed in the Visit Navigator and updated as appropriate.       Sue Glasgow, NP 07/03/17 2148

## 2017-07-03 NOTE — ED Triage Notes (Signed)
Pt here for wound check on her right lateral ankle.  Pt states she completed her antibiotics and is having no pain.

## 2017-07-03 NOTE — Discharge Instructions (Signed)
Keep your wound clean and dry, and refilled your antibiotics, return to clinic in 4-5 days for wound recheck. Watch for any signs or symptoms of infection. If you have any worsened pain, fever, red streaking, go to the ER.

## 2017-07-03 NOTE — ED Notes (Signed)
Applied dry dressing to pt's packed wound.  Used nonadherent pad, Kerlex, and paper tape to secure the dressing.

## 2017-07-04 LAB — CERVICOVAGINAL ANCILLARY ONLY
Bacterial vaginitis: NEGATIVE
Candida vaginitis: POSITIVE — AB

## 2017-07-05 ENCOUNTER — Other Ambulatory Visit (HOSPITAL_BASED_OUTPATIENT_CLINIC_OR_DEPARTMENT_OTHER): Payer: Medicaid Other

## 2017-07-05 ENCOUNTER — Other Ambulatory Visit: Payer: Self-pay | Admitting: Obstetrics

## 2017-07-05 ENCOUNTER — Ambulatory Visit (HOSPITAL_BASED_OUTPATIENT_CLINIC_OR_DEPARTMENT_OTHER): Payer: Medicaid Other | Admitting: Oncology

## 2017-07-05 ENCOUNTER — Telehealth: Payer: Self-pay | Admitting: Oncology

## 2017-07-05 VITALS — BP 122/82 | HR 57 | Temp 97.5°F | Resp 18 | Ht 70.0 in | Wt 168.8 lb

## 2017-07-05 DIAGNOSIS — Z79810 Long term (current) use of selective estrogen receptor modulators (SERMs): Secondary | ICD-10-CM | POA: Diagnosis not present

## 2017-07-05 DIAGNOSIS — Z170 Estrogen receptor positive status [ER+]: Secondary | ICD-10-CM | POA: Diagnosis not present

## 2017-07-05 DIAGNOSIS — C50412 Malignant neoplasm of upper-outer quadrant of left female breast: Secondary | ICD-10-CM

## 2017-07-05 DIAGNOSIS — B373 Candidiasis of vulva and vagina: Secondary | ICD-10-CM

## 2017-07-05 DIAGNOSIS — B3731 Acute candidiasis of vulva and vagina: Secondary | ICD-10-CM

## 2017-07-05 DIAGNOSIS — Z853 Personal history of malignant neoplasm of breast: Secondary | ICD-10-CM

## 2017-07-05 LAB — CBC WITH DIFFERENTIAL/PLATELET
BASO%: 1.2 % (ref 0.0–2.0)
BASOS ABS: 0 10*3/uL (ref 0.0–0.1)
EOS ABS: 0 10*3/uL (ref 0.0–0.5)
EOS%: 1.2 % (ref 0.0–7.0)
HEMATOCRIT: 38.8 % (ref 34.8–46.6)
HEMOGLOBIN: 12.8 g/dL (ref 11.6–15.9)
LYMPH#: 1.9 10*3/uL (ref 0.9–3.3)
LYMPH%: 56.3 % — ABNORMAL HIGH (ref 14.0–49.7)
MCH: 33.2 pg (ref 25.1–34.0)
MCHC: 33 g/dL (ref 31.5–36.0)
MCV: 100.8 fL (ref 79.5–101.0)
MONO#: 0.4 10*3/uL (ref 0.1–0.9)
MONO%: 10.8 % (ref 0.0–14.0)
NEUT#: 1 10*3/uL — ABNORMAL LOW (ref 1.5–6.5)
NEUT%: 30.5 % — AB (ref 38.4–76.8)
Platelets: 198 10*3/uL (ref 145–400)
RBC: 3.85 10*6/uL (ref 3.70–5.45)
RDW: 14.1 % (ref 11.2–14.5)
WBC: 3.3 10*3/uL — ABNORMAL LOW (ref 3.9–10.3)

## 2017-07-05 LAB — COMPREHENSIVE METABOLIC PANEL
ALBUMIN: 3.6 g/dL (ref 3.5–5.0)
ALK PHOS: 70 U/L (ref 40–150)
ALT: 12 U/L (ref 0–55)
AST: 20 U/L (ref 5–34)
Anion Gap: 7 mEq/L (ref 3–11)
BUN: 16.5 mg/dL (ref 7.0–26.0)
CALCIUM: 9.7 mg/dL (ref 8.4–10.4)
CO2: 29 mEq/L (ref 22–29)
Chloride: 107 mEq/L (ref 98–109)
Creatinine: 0.8 mg/dL (ref 0.6–1.1)
Glucose: 62 mg/dl — ABNORMAL LOW (ref 70–140)
POTASSIUM: 3.7 meq/L (ref 3.5–5.1)
Sodium: 143 mEq/L (ref 136–145)
Total Bilirubin: 0.36 mg/dL (ref 0.20–1.20)
Total Protein: 7.6 g/dL (ref 6.4–8.3)

## 2017-07-05 LAB — CYTOLOGY - PAP
ADEQUACY: ABSENT
Diagnosis: NEGATIVE
HPV: NOT DETECTED

## 2017-07-05 MED ORDER — TAMOXIFEN CITRATE 20 MG PO TABS: 20.0000 mg | ORAL_TABLET | Freq: Every day | ORAL | 2 refills | Status: DC

## 2017-07-05 MED ORDER — VENLAFAXINE HCL ER 75 MG PO CP24: 75.0000 mg | ORAL_CAPSULE | Freq: Every day | ORAL | 4 refills | Status: DC

## 2017-07-05 MED ORDER — FLUCONAZOLE 150 MG PO TABS: 150.0000 mg | ORAL_TABLET | Freq: Once | ORAL | 0 refills | Status: DC

## 2017-07-05 NOTE — Progress Notes (Signed)
ID: Sue [name]   DOB: [birth year]  MR#: [id number]  TDV#:[id number]  PCP: Patient, No Pcp Per GYN:  SU: Sue [name] OTHER MD: Sue [name]  CHIEF COMPLAINT: Estrogen receptor positive left breast cancer  CURRENT THERAPY: Tamoxifen   BREAST CANCER HISTORY: From the original intake note:  She had a mammogram in January 2013 which revealed some calcifications in the left breast. Spot compression views suggested a benign etiology but a six-month followup was recommended. Followup mammogram September 2013 showed suspicious calcifications biopsy is recommended. 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 MRI scan performed 07/31/2012 showed an area of enhancement consistent DCIS with clip marker present.  Lumpectomy 08/28/2012 was performed. This revealed multiple and close it involved margins. 2 sentinel lymph nodes were removed both of which were negative for malignancy, but one does have isolated tumor cells seen in the subcapsular location. In addition a small tiny focus of microinvasive tumor was felt to be seen as well.  The patient underwent reexcision on 09/10/2012, additional DCIS was seen adjacent to the left medial and lateral margin.   Her subsequent history is as detailed below  INTERVAL HISTORY: Sue [name] returns today for follow-up and treatment of her estrogen receptor positive breast cancer. The interval history is generally unremarkable. She continues on tamoxifen, with good tolerance. The only problem she has is hot flashes and these do occur 67 times a day. She was having one during the visit today and she was sweating quite profusely. She has no other side effects from it and she obtains it at a good price.  REVIEW OF SYSTEMS: Somehow she developed a wound in her right lower leg, just above the ankle. This may have originally been a bite she scratched. She is not sure how it happened but in any case it required an emergency room visit 2  weeks ago and she is being followed through urgent care, with a second visit to this weekend. Aside from that work is fine family is fine and a detailed review of systems was noncontributory  PAST MEDICAL HISTORY: Past Medical History:  Diagnosis Date  . Breast cancer ([place]) 08/28/12   Left Breast  . S/P radiation therapy 12/06/12 -01/23/13   Left Breast/Axilla / 46 Gy / 23 Fractions with a Boost to Left Breast / 14 Gy / 7 Fractions  . Use of tamoxifen (Nolvadex) march 2014  . Wears glasses     PAST SURGICAL HISTORY: Past Surgical History:  Procedure Laterality Date  . DILATION AND CURETTAGE OF UTERUS     Following Miscarriage  . FOOT FUSION  3/09   ankle rt  . Left Breast Lumpectomy  08/28/12  . 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 Microcalcifications Identified  . RE-EXCISION OF BREAST CANCER,SUPERIOR MARGINS  10/30/2012   Procedure: RE-EXCISION OF BREAST CANCER,SUPERIOR MARGINS;  Surgeon: Sue Hector, MD;  Location: [facility];  Service: General;  Laterality: N/A;  left partial mastectomy with excision of margins  . re-excision of left breast cancer on 09/10/12      FAMILY HISTORY Family History  Problem Relation Age of Onset  . Breast cancer Mother 69       blood cancer too  . Breast cancer Cousin        paternal cousin diagnosed; diagnosed in her late 38s  . Brain cancer Paternal Aunt        diagnosed in late 31s  to early 45s  The patient's father is alive at age 107. The patient's mother died at the age of 38 from metastatic breast cancer which was diagnosed when she was 44. The patient has 2 brothers and one sister. There is no other history of breast or ovarian cancer in the immediate family.   GYNECOLOGIC HISTORY:  Menarche age 27, first live birth age 39. Her periods were regular until September 2014, when they stopped. She never used birth control pills.  SOCIAL HISTORY: Sue [name] has a bachelor of science degree from  [employer] in food science. Her husband Sue [name]. 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 [name], currently 72-year-old, is also at home. She at tends [place] will. The patient is a Nurse, learning disability.  ADVANCED DIRECTIVES: not in place  HEALTH MAINTENANCE: Social History  Substance Use Topics  . Smoking status: Former Smoker    Types: Cigarettes    Quit date: 11/30/1993  . Smokeless tobacco: Never Used  . Alcohol use Yes     Comment: very seldom, 1x per week     Colonoscopy: Never  PAP:06/30/2017  Bone density: Never  Lipid panel:  No Known Allergies  Current Outpatient Prescriptions  Medication Sig Dispense Refill  . cephALEXin (KEFLEX) 500 MG capsule Take 1 capsule (500 mg total) by mouth 3 (three) times daily. 15 capsule 0  . tamoxifen (NOLVADEX) 20 MG tablet Take 1 tablet (20 mg total) by mouth daily. 90 tablet 2  . venlafaxine XR (EFFEXOR-XR) 75 MG 24 hr capsule Take 1 capsule (75 mg total) by mouth daily with breakfast. 90 capsule 4   No current facility-administered medications for this visit.     OBJECTIVE: middle-aged African American woman In no acute distress  Vitals:   07/05/17 1215  BP: 122/82  Pulse: (!) 57  Resp: 18  Temp: (!) 97.5 F (36.4 C)  SpO2: 100%     Body mass index is 24.22 kg/m.    ECOG FS: 0 Filed Weights   07/05/17 1215  Weight: 168 lb 12.8 oz (76.6 kg)   Sclerae unicteric, EOMs intact Oropharynx clear and moist No cervical or supraclavicular adenopathy Lungs no rales or rhonchi Heart regular rate and rhythm Abd soft, nontender, positive bowel sounds MSK no focal spinal tenderness, no upper extremity lymphedema Neuro: nonfocal, well oriented, appropriate affect Breasts: The right breast is benign. The left breast status post lumpectomy and radiation. There is no evidence of local recurrence. Both axillae are benign.   LAB RESULTS: Lab Results  Component Value Date   WBC 3.3 (L) 07/05/2017   NEUTROABS 1.0  (L) 07/05/2017   HGB 12.8 07/05/2017   HCT 38.8 07/05/2017   MCV 100.8 07/05/2017   PLT 198 07/05/2017      Chemistry      Component Value Date/Time   NA 143 07/05/2017 1129   K 3.7 07/05/2017 1129   CL 103 06/24/2017 1657   CL 108 (H) 05/02/2013 1453   CO2 29 07/05/2017 1129   BUN 16.5 07/05/2017 1129   CREATININE 0.8 07/05/2017 1129      Component Value Date/Time   CALCIUM 9.7 07/05/2017 1129   ALKPHOS 70 07/05/2017 1129   AST 20 07/05/2017 1129   ALT 12 07/05/2017 1129   BILITOT 0.36 07/05/2017 1129       Lab Results  Component Value Date   LABCA2 8 09/20/2012      STUDIES: Mammography is due later this month. Order has been  placed  ASSESSMENT: 51 y.o.  woman status post left breast upper outer quadrant lumpectomy and sentinel lymph node sampling 08/28/2012 for ductal carcinoma in situ, grade 3, estrogen receptor 100% positive, progesterone receptor negative, with 0 of 2 sentinel lymph nodes sampled involved  (1) there was an area of microinvasion measuring less than a millimeter, too scant for a prognostic panel   (2) reexcision for margin clearance 10/30/2012 was successful   (3) completed adjuvant radiation 01/23/2013  (4) started tamoxifen March 2014  (5) genetic testing at [facility] pending (Patient has not yet made this appointment)  (6) menstrual periods stopped September 2014, resumed Spring 2016   PLAN:  Keana Is now just about 5 years out from definitive surgery for her breast cancer with no evidence of disease recurrence. This is very favorable.  She is tolerating tamoxifen well. Were going to continue at least one more year and then she can decide whether she wants to go for the full 10 years or simply stop  I am not sure how she got the injury to her right lower leg. That is being treated appropriately and it seems to be getting better.  She is having fairly off all hot flashes. I wrote her for venlafaxine 75 mg to take daily. She will  let me know if that does not work or if she has significant side effects from it.  She knows to call for any problems that may develop before the next visit.  Curtis Uriarte C    07/05/2017

## 2017-07-05 NOTE — Telephone Encounter (Signed)
[facility] and scheduled her mammo. Called patient and informed her of the date and time.

## 2017-07-06 ENCOUNTER — Ambulatory Visit: Payer: Medicaid Other

## 2017-07-06 ENCOUNTER — Other Ambulatory Visit: Payer: Self-pay | Admitting: Obstetrics

## 2017-07-06 ENCOUNTER — Telehealth: Payer: Self-pay

## 2017-07-06 DIAGNOSIS — A5901 Trichomonal vulvovaginitis: Secondary | ICD-10-CM

## 2017-07-06 MED ORDER — METRONIDAZOLE 500 MG PO TABS: 2000.0000 mg | ORAL_TABLET | Freq: Once | ORAL | 0 refills | Status: AC

## 2017-07-06 NOTE — Telephone Encounter (Signed)
Left message on VM to check myChart.

## 2017-07-06 NOTE — Telephone Encounter (Signed)
-----   Message from Shelly Bombard, MD sent at 07/06/2017  6:23 AM EDT ----- Flagyl Rx for BV

## 2017-07-10 ENCOUNTER — Encounter (HOSPITAL_COMMUNITY): Payer: Self-pay | Admitting: Emergency Medicine

## 2017-07-10 ENCOUNTER — Ambulatory Visit (HOSPITAL_COMMUNITY)
Admission: EM | Admit: 2017-07-10 | Discharge: 2017-07-10 | Disposition: A | Discharge status: Home / Self Care | Payer: Medicaid Other

## 2017-07-10 DIAGNOSIS — S81801A Unspecified open wound, right lower leg, initial encounter: Secondary | ICD-10-CM

## 2017-07-10 NOTE — Discharge Instructions (Signed)
Do daily wet to dry dressing to right lower extremity until wound is healed up.

## 2017-07-10 NOTE — ED Triage Notes (Signed)
PT reports she has a wound to right ankle that has been packed multiple times. PT is here to get packing removed.

## 2017-07-10 NOTE — ED Provider Notes (Signed)
  [place]   [id number] 07/10/17 Arrival Time: 7672  ASSESSMENT & PLAN:  1. Wound of right lower extremity, initial encounter     No orders of the defined types were placed in this encounter.  Wet to dry dressing applied Apply wet to dry dressing daily. Reviewed expectations re: course of current medical issues. Questions answered. Outlined signs and symptoms indicating need for more acute intervention. Patient verbalized understanding. After Visit Summary given.   SUBJECTIVE:  Sue [name] is a 51 y.o. female who presents with complaint of follow up on wound right lower extremity.  ROS: As per HPI.   OBJECTIVE:  Vitals:   07/10/17 1658  BP: 126/74  Pulse: 62  Resp: 16  Temp: 98.2 F (36.8 C)  TempSrc: Temporal  SpO2: 100%  Weight: 168 lb (76.2 kg)  Height: 5\' 10"  (1.778 m)     General appearance: alert; no distress Eyes: PERRLA; EOMI; conjunctiva normal HENT: normocephalic; atraumatic; TMs normal; nasal mucosa normal; oral mucosa normal Neck: supple Lungs: clear to auscultation bilaterally Heart: regular rate and rhythm Skin: ulcer right pre tibial area approx 1/2 cm diameter and 4 mm deep Neurologic: normal gait; normal symmetric reflexes Psychological: alert and cooperative; normal mood and affect   Past Medical History:  Diagnosis Date  . Breast cancer ([place]) 08/28/12   Left Breast  . S/P radiation therapy 12/06/12 -01/23/13   Left Breast/Axilla / 46 Gy / 23 Fractions with a Boost to Left Breast / 14 Gy / 7 Fractions  . Use of tamoxifen (Nolvadex) march 2014  . Wears glasses      has a past medical history of Breast cancer ([place]) (08/28/12); S/P radiation therapy (12/06/12 -01/23/13); Use of tamoxifen (Nolvadex) (march 2014); and Wears glasses.    Labs Reviewed - No data to display  Imaging: No results found.  No Known Allergies  Family History  Problem Relation Age of Onset  . Breast cancer Mother 51       blood cancer too  .  Breast cancer Cousin        paternal cousin diagnosed; diagnosed in her late 61s  . Brain cancer Paternal Aunt        diagnosed in late 28s to early 73s   Past Surgical History:  Procedure Laterality Date  . DILATION AND CURETTAGE OF UTERUS     Following Miscarriage  . FOOT FUSION  3/09   ankle rt  . Left Breast Lumpectomy  08/28/12  . 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 Microcalcifications Identified  . RE-EXCISION OF BREAST CANCER,SUPERIOR MARGINS  10/30/2012   Procedure: RE-EXCISION OF BREAST CANCER,SUPERIOR MARGINS;  Surgeon: Adin Hector, MD;  Location: [facility];  Service: General;  Laterality: N/A;  left partial mastectomy with excision of margins  . re-excision of left breast cancer on 09/10/12           Lysbeth Penner, FNP 07/10/17 1739

## 2017-07-14 ENCOUNTER — Ambulatory Visit
Admission: RE | Admit: 2017-07-14 | Discharge: 2017-07-14 | Disposition: A | Discharge status: Home / Self Care | Payer: Medicaid Other | Source: Ambulatory Visit | Attending: Oncology | Admitting: Oncology

## 2017-07-14 ENCOUNTER — Ambulatory Visit: Payer: Medicaid Other

## 2017-07-14 DIAGNOSIS — C50412 Malignant neoplasm of upper-outer quadrant of left female breast: Secondary | ICD-10-CM

## 2017-07-14 DIAGNOSIS — Z170 Estrogen receptor positive status [ER+]: Principal | ICD-10-CM

## 2018-05-28 ENCOUNTER — Other Ambulatory Visit: Payer: Self-pay | Admitting: Oncology

## 2018-05-28 DIAGNOSIS — Z1231 Encounter for screening mammogram for malignant neoplasm of breast: Secondary | ICD-10-CM

## 2018-06-07 ENCOUNTER — Ambulatory Visit: Payer: Medicaid Other | Admitting: Obstetrics

## 2018-07-02 ENCOUNTER — Ambulatory Visit: Payer: Medicaid Other | Admitting: Obstetrics

## 2018-07-02 ENCOUNTER — Encounter: Payer: Self-pay | Admitting: Obstetrics

## 2018-07-02 ENCOUNTER — Other Ambulatory Visit (HOSPITAL_COMMUNITY)
Admission: RE | Admit: 2018-07-02 | Discharge: 2018-07-02 | Disposition: A | Discharge status: Home / Self Care | Payer: Medicaid Other | Source: Ambulatory Visit | Attending: Obstetrics | Admitting: Obstetrics

## 2018-07-02 VITALS — BP 121/78 | HR 60 | Ht 70.0 in | Wt 162.4 lb

## 2018-07-02 DIAGNOSIS — Z1151 Encounter for screening for human papillomavirus (HPV): Secondary | ICD-10-CM | POA: Insufficient documentation

## 2018-07-02 DIAGNOSIS — Z309 Encounter for contraceptive management, unspecified: Secondary | ICD-10-CM | POA: Diagnosis not present

## 2018-07-02 DIAGNOSIS — Z01419 Encounter for gynecological examination (general) (routine) without abnormal findings: Secondary | ICD-10-CM | POA: Insufficient documentation

## 2018-07-02 DIAGNOSIS — Z853 Personal history of malignant neoplasm of breast: Secondary | ICD-10-CM

## 2018-07-02 DIAGNOSIS — N951 Menopausal and female climacteric states: Secondary | ICD-10-CM

## 2018-07-02 NOTE — Progress Notes (Signed)
Subjective:        Sue [name] is a 52 y.o. female here for a routine exam.  Current complaints: None.    Personal health questionnaire:  Is patient Sue [name], have a family history of breast and/or ovarian cancer: no Is there a family history of uterine cancer diagnosed at age < 19, gastrointestinal cancer, urinary tract cancer, family member who is a Field seismologist syndrome-associated carrier: no Is the patient overweight and hypertensive, family history of diabetes, personal history of gestational diabetes, preeclampsia or PCOS: no Is patient over 21, have PCOS,  family history of premature CHD under age 47, diabetes, smoke, have hypertension or peripheral artery disease:  no At any time, has a partner hit, kicked or otherwise hurt or frightened you?: no Over the past 2 weeks, have you felt down, depressed or hopeless?: no Over the past 2 weeks, have you felt little interest or pleasure in doing things?:no   Gynecologic History No LMP recorded. (Menstrual status: Perimenopausal). Contraception: none Last Pap: 2017. Results were: normal Last mammogram: 2018. Results were: normal  Obstetric History OB History  Gravida Para Term Preterm AB Living  2 2 1     1   SAB TAB Ectopic Multiple Live Births          1    # Outcome Date GA Lbr Len/2nd Weight Sex Delivery Anes PTL Lv  2 Term 01/06/06    F Vag-Spont   LIV  1 Para             Past Medical History:  Diagnosis Date  . Breast cancer ([place]) 08/28/12   Left Breast  . S/P radiation therapy 12/06/12 -01/23/13   Left Breast/Axilla / 46 Gy / 23 Fractions with a Boost to Left Breast / 14 Gy / 7 Fractions  . Use of tamoxifen (Nolvadex) march 2014  . Wears glasses     Past Surgical History:  Procedure Laterality Date  . BREAST LUMPECTOMY Left    takes tamoxifen  . DILATION AND CURETTAGE OF UTERUS     Following Miscarriage  . FOOT FUSION  3/09   ankle rt  . Left Breast Lumpectomy  08/28/12  . Left Breast Needle Core  Biopsy  07/26/12   UOQ - Ductal Carcinoma In Situ with Necrosis. Microcalcifications Identified  . RE-EXCISION OF BREAST CANCER,SUPERIOR MARGINS  10/30/2012   Procedure: RE-EXCISION OF BREAST CANCER,SUPERIOR MARGINS;  Surgeon: Adin Hector, MD;  Location: [facility];  Service: General;  Laterality: N/A;  left partial mastectomy with excision of margins  . re-excision of left breast cancer on 09/10/12       Current Outpatient Medications:  .  tamoxifen (NOLVADEX) 20 MG tablet, Take 1 tablet (20 mg total) by mouth daily., Disp: 90 tablet, Rfl: 2 .  cephALEXin (KEFLEX) 500 MG capsule, Take 1 capsule (500 mg total) by mouth 3 (three) times daily. (Patient not taking: Reported on 07/02/2018), Disp: 15 capsule, Rfl: 0 .  venlafaxine XR (EFFEXOR-XR) 75 MG 24 hr capsule, Take 1 capsule (75 mg total) by mouth daily with breakfast. (Patient not taking: Reported on 07/02/2018), Disp: 90 capsule, Rfl: 4 No Known Allergies  Social History   Tobacco Use  . Smoking status: Former Smoker    Types: Cigarettes    Last attempt to quit: 11/30/1993    Years since quitting: 24.6  . Smokeless tobacco: Never Used  Substance Use Topics  . Alcohol use: Yes    Comment: very seldom, 1x per week  Family History  Problem Relation Age of Onset  . Breast cancer Mother 32       blood cancer too  . Breast cancer Cousin        paternal cousin diagnosed; diagnosed in her late 26s  . Brain cancer Paternal Aunt        diagnosed in late 1s to early 4s      Review of Systems  Constitutional: negative for fatigue and weight loss Respiratory: negative for cough and wheezing Cardiovascular: negative for chest pain, fatigue and palpitations Gastrointestinal: negative for abdominal pain and change in bowel habits Musculoskeletal:negative for myalgias Neurological: negative for gait problems and tremors Behavioral/Psych: negative for abusive relationship, depression Endocrine: negative for temperature intolerance     Genitourinary:negative for abnormal menstrual periods, genital lesions, hot flashes, sexual problems and vaginal discharge Integument/breast: negative for breast lump, breast tenderness, nipple discharge and skin lesion(s)    Objective:       BP 121/78   Pulse 60   Ht 5\' 10"  (1.778 m)   Wt 162 lb 6.4 oz (73.7 kg)   BMI 23.30 kg/m  General:   alert  Skin:   no rash or abnormalities  Lungs:   clear to auscultation bilaterally  Heart:   regular rate and rhythm, S1, S2 normal, no murmur, click, rub or gallop  Breasts:   normal without suspicious masses, skin or nipple changes or axillary nodes  Abdomen:  normal findings: no organomegaly, soft, non-tender and no hernia  Pelvis:  External genitalia: normal general appearance Urinary system: urethral meatus normal and bladder without fullness, nontender Vaginal: normal without tenderness, induration or masses Cervix: normal appearance Adnexa: normal bimanual exam Uterus: anteverted and non-tender, normal size   Lab Review Urine pregnancy test Labs reviewed yes Radiologic studies reviewed yes  50% of 20 min visit spent on counseling and coordination of care.   Assessment:     1. Encounter for routine gynecological examination with Papanicolaou smear of cervix - doing well  2. History of breast cancer - S/P lumpectomy left breast, radiation and Tamoxifen  3. Perimenopause - doing well   Plan:    Education reviewed: calcium supplements, depression evaluation, low fat, low cholesterol diet, safe sex/STD prevention, self breast exams and weight bearing exercise. Contraception: none. Follow up in: 1 year.   No orders of the defined types were placed in this encounter.  No orders of the defined types were placed in this encounter.   Sue [name] A. ARPER MD 07-02-2018

## 2018-07-02 NOTE — Progress Notes (Signed)
Patient is in the office for annual, last pap 06-30-17. Pt declines std testing. Pt is has been undergoing treatment for breast cancer, pt states that she finds out Thursday if it is in remission.

## 2018-07-03 LAB — CYTOLOGY - PAP
Diagnosis: NEGATIVE
HPV: NOT DETECTED

## 2018-07-04 ENCOUNTER — Other Ambulatory Visit: Payer: Self-pay | Admitting: Obstetrics

## 2018-07-04 DIAGNOSIS — B373 Candidiasis of vulva and vagina: Secondary | ICD-10-CM

## 2018-07-04 DIAGNOSIS — B3731 Acute candidiasis of vulva and vagina: Secondary | ICD-10-CM

## 2018-07-04 MED ORDER — TERCONAZOLE 0.8 % VA CREA: 1.0000 | TOPICAL_CREAM | Freq: Every day | VAGINAL | 0 refills | Status: DC

## 2018-07-04 NOTE — Progress Notes (Signed)
ID: Sue [name]   DOB: 1966/05/28  MR#: [id number]  IWO#:[id number]  PCP: Chauncey Cruel, MD GYN:  Kasandra KnudsenRenelda Loma Ingram.[email or web address]  OTHER MD: Eppie Gibson  CHIEF COMPLAINT: Estrogen receptor positive left breast cancer  CURRENT THERAPY: Completing 5 years of tamoxifen   BREAST CANCER HISTORY: From the original intake note:  She had a mammogram in January 2013 which revealed some calcifications in the left breast. Spot compression views suggested a benign etiology but a six-month followup was recommended. Followup mammogram September 2013 showed suspicious calcifications biopsy is recommended. 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 MRI scan performed 07/31/2012 showed an area of enhancement consistent DCIS with clip marker present.  Lumpectomy 08/28/2012 was performed. This revealed multiple and close it involved margins. 2 sentinel lymph nodes were removed both of which were negative for malignancy, but one does have isolated tumor cells seen in the subcapsular location. In addition a small tiny focus of microinvasive tumor was felt to be seen as well.  The patient underwent reexcision on 09/10/2012, additional DCIS was seen adjacent to the left medial and lateral margin.   Her subsequent history is as detailed below  INTERVAL HISTORY: Sue [name] returns today for follow-up and treatment of her estrogen receptor positive breast cancer. She continues on tamoxifen, with good tolerance. She has frequent hot flashes that are intense. She takes venlafaxine, which helps, but causes abdominal discomfort. She has some increased vaginal discharge, and she is taking fluconazole, due to a yeast infection.   She is scheduled for mammography at [facility] on 07/17/2018.   REVIEW OF SYSTEMS: Javana reports that she is working and planning on going back to graduate school in nutrition. Her 23 year old daughter is also in school now. She denies unusual  headaches, visual changes, nausea, vomiting, or dizziness. There has been no unusual cough, phlegm production, or pleurisy. There has been no change in bowel or bladder habits. She denies unexplained fatigue or unexplained weight loss, bleeding, rash, or fever. A detailed review of systems was otherwise stable.    PAST MEDICAL HISTORY: Past Medical History:  Diagnosis Date  . Breast cancer ([place]) 08/28/12   Left Breast  . S/P radiation therapy 12/06/12 -01/23/13   Left Breast/Axilla / 46 Gy / 23 Fractions with a Boost to Left Breast / 14 Gy / 7 Fractions  . Use of tamoxifen (Nolvadex) march 2014  . Wears glasses     PAST SURGICAL HISTORY: Past Surgical History:  Procedure Laterality Date  . BREAST LUMPECTOMY Left    takes tamoxifen  . DILATION AND CURETTAGE OF UTERUS     Following Miscarriage  . FOOT FUSION  3/09   ankle rt  . Left Breast Lumpectomy  08/28/12  . 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 Microcalcifications Identified  . RE-EXCISION OF BREAST CANCER,SUPERIOR MARGINS  10/30/2012   Procedure: RE-EXCISION OF BREAST CANCER,SUPERIOR MARGINS;  Surgeon: Adin Hector, MD;  Location: [facility];  Service: General;  Laterality: N/A;  left partial mastectomy with excision of margins  . re-excision of left breast cancer on 09/10/12      FAMILY HISTORY Family History  Problem Relation Age of Onset  . Breast cancer Mother 51       blood cancer too  . Breast cancer Cousin        paternal cousin diagnosed; diagnosed in her late 29s  . Brain cancer Paternal Aunt  diagnosed in late 2s to early 8s  The patient's father is alive at age 62. The patient's mother died at the age of 17 from metastatic breast cancer which was diagnosed when she was 24. The patient has 2 brothers and one sister. There is no other history of breast or ovarian cancer in the immediate family.   GYNECOLOGIC HISTORY:  Menarche age 52, first live birth age  52. Her periods were regular until September 2014. Her LMP was in 2018. She never used birth control pills.  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 She currently works at [employer]. She is going to school for her master's in nutrition. Her husband Micah Flesher. Amedeo Plenty is in Press photographer for [employer]. Daughter [name], currently 63 year old, is also at home.The patient is a Nurse, learning disability.   She at tends [place] will.    ADVANCED DIRECTIVES: The patient's husband is her healthcare power of attorney unless otherwise specified by the patient  HEALTH MAINTENANCE: Social History   Tobacco Use  . Smoking status: Former Smoker    Types: Cigarettes    Last attempt to quit: 11/30/1993    Years since quitting: 24.6  . Smokeless tobacco: Never Used  Substance Use Topics  . Alcohol use: Yes    Comment: very seldom, 1x per week  . Drug use: No     Colonoscopy: Never  PAP:06/30/2017  Bone density: Never  Lipid panel:  No Known Allergies  Current Outpatient Medications  Medication Sig Dispense Refill  . cephALEXin (KEFLEX) 500 MG capsule Take 1 capsule (500 mg total) by mouth 3 (three) times daily. (Patient not taking: Reported on 07/02/2018) 15 capsule 0  . tamoxifen (NOLVADEX) 20 MG tablet Take 1 tablet (20 mg total) by mouth daily. 90 tablet 2  . terconazole (TERAZOL 3) 0.8 % vaginal cream Place 1 applicator vaginally at bedtime. 20 g 0  . venlafaxine XR (EFFEXOR-XR) 75 MG 24 hr capsule Take 1 capsule (75 mg total) by mouth daily with breakfast. (Patient not taking: Reported on 07/02/2018) 90 capsule 4   No current facility-administered medications for this visit.     OBJECTIVE: middle-aged [place] American woman who looks well  Vitals:   07/05/18 1355  BP: 93/67  Pulse: 65  Resp: 18  Temp: 98.6 F (37 C)  SpO2: 100%     Body mass index is 23.76 kg/m.    ECOG FS: 0 Filed Weights    07/05/18 1355  Weight: 165 lb 9.6 oz (75.1 kg)   Sclerae unicteric, pupils round and equal Oropharynx clear and moist No cervical or supraclavicular adenopathy Lungs no rales or rhonchi Heart regular rate and rhythm Abd soft, nontender, positive bowel sounds MSK no focal spinal tenderness, no upper extremity lymphedema Neuro: nonfocal, well oriented, appropriate affect Breasts: The right breast is unremarkable.  The left breast status post lumpectomy and radiation.  There is some irregularity in the contour but no evidence of disease recurrence.  Both axillae are benign.  LAB RESULTS: Lab Results  Component Value Date   WBC 3.2 (L) 07/05/2018   NEUTROABS 1.0 (L) 07/05/2018   HGB 13.0 07/05/2018   HCT 39.1 07/05/2018   MCV 101.3 (H) 07/05/2018   PLT 154 07/05/2018      Chemistry      Component Value Date/Time   NA 143 07/05/2017 1129   K 3.7 07/05/2017 1129   CL 103 06/24/2017 1657  CL 108 (H) 05/02/2013 1453   CO2 29 07/05/2017 1129   BUN 16.5 07/05/2017 1129   CREATININE 0.8 07/05/2017 1129      Component Value Date/Time   CALCIUM 9.7 07/05/2017 1129   ALKPHOS 70 07/05/2017 1129   AST 20 07/05/2017 1129   ALT 12 07/05/2017 1129   BILITOT 0.36 07/05/2017 1129       Lab Results  Component Value Date   LABCA2 8 09/20/2012      STUDIES: No results found.  She is scheduled for mammography on 07/17/2018   ASSESSMENT: 52 y.o. [place] woman status post left breast upper outer quadrant lumpectomy and sentinel lymph node sampling 08/28/2012 for ductal carcinoma in situ, grade 3, estrogen receptor 100% positive, progesterone receptor negative, with 0 of 2 sentinel lymph nodes sampled involved  (1) there was an area of microinvasion measuring less than a millimeter, too scant for a prognostic panel   (2) reexcision for margin clearance 10/30/2012 was successful   (3) completed adjuvant radiation 01/23/2013  (4) started tamoxifen March 2014, stopped September  2019  (5) genetic testing at [facility] pending (Patient has not yet made this appointment)  (6) menstrual periods stopped September 2014, resumed Spring 2016   PLAN:  Kimblery is now just about 6 years out from definitive surgery for her breast cancer with no evidence of disease recurrence.  This is very favorable.  She has completed 5 years of tamoxifen.  This essentially cuts her risk of recurrence and also her risk of developing a new breast cancer in half.  At this point I feel comfortable releasing her to her primary care physician.  All she will need is a yearly physician breast exam and yearly mammography.  I will be glad to see her again at any point in the future but as of now are making no further routine appointments for her here.  No results found.

## 2018-07-05 ENCOUNTER — Inpatient Hospital Stay: Payer: Self-pay | Attending: Oncology | Admitting: Oncology

## 2018-07-05 ENCOUNTER — Inpatient Hospital Stay: Payer: Self-pay

## 2018-07-05 ENCOUNTER — Encounter: Payer: Self-pay | Admitting: Oncology

## 2018-07-05 VITALS — BP 93/67 | HR 65 | Temp 98.6°F | Resp 18 | Ht 70.0 in | Wt 165.6 lb

## 2018-07-05 DIAGNOSIS — Z79899 Other long term (current) drug therapy: Secondary | ICD-10-CM | POA: Insufficient documentation

## 2018-07-05 DIAGNOSIS — Z87891 Personal history of nicotine dependence: Secondary | ICD-10-CM | POA: Insufficient documentation

## 2018-07-05 DIAGNOSIS — Z170 Estrogen receptor positive status [ER+]: Secondary | ICD-10-CM | POA: Insufficient documentation

## 2018-07-05 DIAGNOSIS — Z79810 Long term (current) use of selective estrogen receptor modulators (SERMs): Secondary | ICD-10-CM | POA: Insufficient documentation

## 2018-07-05 DIAGNOSIS — Z923 Personal history of irradiation: Secondary | ICD-10-CM | POA: Insufficient documentation

## 2018-07-05 DIAGNOSIS — C50412 Malignant neoplasm of upper-outer quadrant of left female breast: Secondary | ICD-10-CM

## 2018-07-05 DIAGNOSIS — B373 Candidiasis of vulva and vagina: Secondary | ICD-10-CM | POA: Insufficient documentation

## 2018-07-05 DIAGNOSIS — Z803 Family history of malignant neoplasm of breast: Secondary | ICD-10-CM | POA: Insufficient documentation

## 2018-07-05 LAB — CBC WITH DIFFERENTIAL/PLATELET
Basophils Absolute: 0 10*3/uL (ref 0.0–0.1)
Basophils Relative: 1 %
Eosinophils Absolute: 0 10*3/uL (ref 0.0–0.5)
Eosinophils Relative: 1 %
HEMATOCRIT: 39.1 % (ref 34.8–46.6)
Hemoglobin: 13 g/dL (ref 11.6–15.9)
LYMPHS PCT: 55 %
Lymphs Abs: 1.8 10*3/uL (ref 0.9–3.3)
MCH: 33.6 pg (ref 25.1–34.0)
MCHC: 33.2 g/dL (ref 31.5–36.0)
MCV: 101.3 fL — AB (ref 79.5–101.0)
MONO ABS: 0.3 10*3/uL (ref 0.1–0.9)
MONOS PCT: 10 %
Neutro Abs: 1 10*3/uL — ABNORMAL LOW (ref 1.5–6.5)
Neutrophils Relative %: 33 %
Platelets: 154 10*3/uL (ref 145–400)
RBC: 3.86 MIL/uL (ref 3.70–5.45)
RDW: 14 % (ref 11.2–14.5)
WBC: 3.2 10*3/uL — ABNORMAL LOW (ref 3.9–10.3)

## 2018-07-05 LAB — COMPREHENSIVE METABOLIC PANEL
ALBUMIN: 3.6 g/dL (ref 3.5–5.0)
ALK PHOS: 78 U/L (ref 38–126)
ALT: 12 U/L (ref 0–44)
AST: 16 U/L (ref 15–41)
Anion gap: 7 (ref 5–15)
BUN: 15 mg/dL (ref 6–20)
CO2: 27 mmol/L (ref 22–32)
Calcium: 9.1 mg/dL (ref 8.9–10.3)
Chloride: 109 mmol/L (ref 98–111)
Creatinine, Ser: 0.74 mg/dL (ref 0.44–1.00)
GFR calc Af Amer: >60 mL/min (ref >60)
GFR calc non Af Amer: >60 mL/min (ref >60)
GLUCOSE: 106 mg/dL — AB (ref 70–99)
POTASSIUM: 3.5 mmol/L (ref 3.5–5.1)
SODIUM: 143 mmol/L (ref 135–145)
Total Bilirubin: <0.2 mg/dL — ABNORMAL LOW (ref 0.3–1.2)
Total Protein: 6.8 g/dL (ref 6.5–8.1)

## 2018-07-06 ENCOUNTER — Telehealth: Payer: Self-pay | Admitting: Oncology

## 2018-07-06 NOTE — Telephone Encounter (Signed)
Per 8/22 no los

## 2018-07-17 ENCOUNTER — Ambulatory Visit: Payer: Medicaid Other

## 2018-08-20 ENCOUNTER — Ambulatory Visit: Payer: Medicaid Other

## 2018-08-24 ENCOUNTER — Ambulatory Visit (INDEPENDENT_AMBULATORY_CARE_PROVIDER_SITE_OTHER): Payer: Self-pay | Admitting: Family Medicine

## 2018-08-24 ENCOUNTER — Encounter: Payer: Self-pay | Admitting: Family Medicine

## 2018-08-24 VITALS — BP 120/90 | HR 60 | Temp 98.2°F | Ht 70.0 in | Wt 171.0 lb

## 2018-08-24 DIAGNOSIS — R630 Anorexia: Secondary | ICD-10-CM

## 2018-08-24 DIAGNOSIS — Z170 Estrogen receptor positive status [ER+]: Secondary | ICD-10-CM

## 2018-08-24 DIAGNOSIS — R232 Flushing: Secondary | ICD-10-CM

## 2018-08-24 DIAGNOSIS — Z09 Encounter for follow-up examination after completed treatment for conditions other than malignant neoplasm: Secondary | ICD-10-CM

## 2018-08-24 DIAGNOSIS — Z131 Encounter for screening for diabetes mellitus: Secondary | ICD-10-CM

## 2018-08-24 DIAGNOSIS — T451X5A Adverse effect of antineoplastic and immunosuppressive drugs, initial encounter: Secondary | ICD-10-CM

## 2018-08-24 DIAGNOSIS — C50412 Malignant neoplasm of upper-outer quadrant of left female breast: Secondary | ICD-10-CM

## 2018-08-24 DIAGNOSIS — Z7689 Persons encountering health services in other specified circumstances: Secondary | ICD-10-CM

## 2018-08-24 LAB — POCT GLYCOSYLATED HEMOGLOBIN (HGB A1C): Hemoglobin A1C: 5 % (ref 4.0–5.6)

## 2018-08-24 MED ORDER — DRONABINOL 5 MG PO CAPS: 5.0000 mg | ORAL_CAPSULE | Freq: Two times a day (BID) | ORAL | 3 refills | Status: DC

## 2018-08-24 NOTE — Progress Notes (Signed)
New Patient--Establish Care  Subjective:    Patient ID: Sue [name], female    DOB: December 01, 1965, 52 y.o.   MRN: [id number]   Chief Complaint  Patient presents with  . Establish Care    HPI  Sue [name] is a 52 year old female with a past medical history of Breast Cancer and Radiation Therapy. She is here today for follow up.  Current Status: Since her last office visit, she is doing well today, with c/o decreasing appetite. She is s/p: completion of  approximately 30+ Radiation Treatments, for Left Breast Cancer, ER +. She received Left Breast Lumpectomy and removal of left Axillary Lymph Nodes/Mass on 08/28/2012. Today she reports that she has not had an appetite. She is currently on Tamoxifen. She has been on medication for 3 years now with a goal of 5 year treatment. She wants to schedule Colonoscopy at her next visit.   She denies fevers, chills, fatigue, recent infections, weight loss, and night sweats. She has not had any headaches, visual changes, dizziness, and falls. No chest pain, heart palpitations, cough and shortness of breath reported. No reports of GI problems such as nausea, vomiting, diarrhea, and constipation. She has no reports of blood in stools, dysuria and hematuria. No depression or anxiety reported.  She denies pain today.   Past Medical History:  Diagnosis Date  . Breast cancer ([place]) 08/28/12   Left Breast  . S/P radiation therapy 12/06/12 -01/23/13   Left Breast/Axilla / 46 Gy / 23 Fractions with a Boost to Left Breast / 14 Gy / 7 Fractions  . Use of tamoxifen (Nolvadex) march 2014  . Wears glasses     Family History  Problem Relation Age of Onset  . Breast cancer Mother 54       blood cancer too  . Breast cancer Cousin        paternal cousin diagnosed; diagnosed in her late 82s  . Brain cancer Paternal Aunt        diagnosed in late 17s to early 87s    Social History   Socioeconomic History  . Marital status: Married    Spouse name: Not on file  .  Number of children: Not on file  . Years of education: Not on file  . Highest education level: Not on file  Occupational History  . Not on file  Social Needs  . Financial resource strain: Not on file  . Food insecurity:    Worry: Not on file    Inability: Not on file  . Transportation needs:    Medical: Not on file    Non-medical: Not on file  Tobacco Use  . Smoking status: Former Smoker    Types: Cigarettes    Last attempt to quit: 11/30/1993    Years since quitting: 24.7  . Smokeless tobacco: Never Used  Substance and Sexual Activity  . Alcohol use: Yes    Comment: very seldom, 1x per week  . Drug use: No  . Sexual activity: Yes    Comment: Menarche age 11, Parity age , G46P1, Premenopausal, No HRT  Lifestyle  . Physical activity:    Days per week: Not on file    Minutes per session: Not on file  . Stress: Not on file  Relationships  . Social connections:    Talks on phone: Not on file    Gets together: Not on file    Attends religious service: Not on file    Active member of club or  organization: Not on file    Attends meetings of clubs or organizations: Not on file    Relationship status: Not on file  . Intimate partner violence:    Fear of current or ex partner: Not on file    Emotionally abused: Not on file    Physically abused: Not on file    Forced sexual activity: Not on file  Other Topics Concern  . Not on file  Social History Narrative  . Not on file    Past Surgical History:  Procedure Laterality Date  . BREAST LUMPECTOMY Left    takes tamoxifen  . DILATION AND CURETTAGE OF UTERUS     Following Miscarriage  . FOOT FUSION  3/09   ankle rt  . Left Breast Lumpectomy  08/28/12  . 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 Microcalcifications Identified  . RE-EXCISION OF BREAST CANCER,SUPERIOR MARGINS  10/30/2012   Procedure: RE-EXCISION OF BREAST CANCER,SUPERIOR MARGINS;  Surgeon: Adin Hector, MD;   Location: [facility];  Service: General;  Laterality: N/A;  left partial mastectomy with excision of margins  . re-excision of left breast cancer on 09/10/12       There is no immunization history on file for this patient.  Current Meds  Medication Sig  . tamoxifen (NOLVADEX) 10 MG tablet Take 10 mg by mouth daily.  Marland Kitchen terconazole (TERAZOL 3) 0.8 % vaginal cream Place 1 applicator vaginally at bedtime.    No Known Allergies  BP 120/90 (BP Location: Left Arm, Patient Position: Sitting, Cuff Size: Small)   Pulse 60   Temp 98.2 F (36.8 C) (Oral)   Ht 5\' 10"  (1.778 m)   Wt 171 lb (77.6 kg)   SpO2 100%   BMI 24.54 kg/m    Review of Systems  Constitutional: Positive for appetite change (decreased appetite ) and diaphoresis (Hot Flashes; r/t Tamoxifen).  HENT: Negative.   Respiratory: Negative.   Cardiovascular: Negative.   Gastrointestinal: Negative.   Genitourinary: Negative.   Musculoskeletal: Negative.   Neurological: Negative.   Psychiatric/Behavioral: Negative.    Objective:   Physical Exam  Constitutional: She is oriented to person, place, and time. She appears well-developed and well-nourished.  HENT:  Head: Normocephalic and atraumatic.  Eyes:  Pupils are dilated today.  Neck: Normal range of motion. Neck supple.  Cardiovascular: Normal rate, regular rhythm, normal heart sounds and intact distal pulses.  Pulmonary/Chest: Effort normal and breath sounds normal.  Abdominal: Soft. Bowel sounds are normal.  Musculoskeletal: Normal range of motion.  Neurological: She is alert and oriented to person, place, and time.  Skin: Skin is warm and dry.  Psychiatric: She has a normal mood and affect. Her behavior is normal. Judgment and thought content normal.  Nursing note and vitals reviewed.  Assessment & Plan:   1. Encounter to establish care - Urinalysis  2. Malignant neoplasm of upper-outer quadrant of left breast in female, estrogen receptor positive ([place]) S/p:  08/2018 Lumpectomy and 30+ radiations treatments. She continues Tamoxifen to reduce recurrence of breast cancer. She will continue treatment for total of 5 years.   3. Loss of appetite She has previously taken Marinol for decreased appetite while receiving txs. We will re-initiated Marinol today.  - dronabinol (MARINOL) 5 MG capsule; Take 1 capsule (5 mg total) by mouth 2 (two) times daily before a meal.  Dispense: 60 capsule; Refill: 3  4. Screening for diabetes mellitus Hgb A1c is within normal  range today. She will continue to decrease foods/beverages high in sugars and carbs and follow Heart Healthy or DASH diet. Increase physical activity to at least 30 minutes cardio exercise daily.  - POCT glycosylated hemoglobin (Hb A1C)  5. Hot flashes due to Tamoxifen Stable. We will continue monitor.   6. Follow up She will follow up in 3 months.   Meds ordered this encounter  Medications  . DISCONTD: dronabinol (MARINOL) 5 MG capsule    Sig: Take 1 capsule (5 mg total) by mouth 2 (two) times daily before a meal.    Dispense:  60 capsule    Refill:  3  . dronabinol (MARINOL) 5 MG capsule    Sig: Take 1 capsule (5 mg total) by mouth 2 (two) times daily before a meal.    Dispense:  60 capsule    Refill:  Oriska,  MSN, FNP-C Patient [employer] [address] [place], [place] [address] [phone]

## 2018-08-24 NOTE — Patient Instructions (Signed)
Dronabinol, THC capsules What is this medicine? DRONABINOL (droe NAB i nol) is used to treat nausea and vomiting caused by cancer treatment, especially for those patients who do not respond to other medicines. This medicine is also used to increase appetite in AIDS patients. This medicine may be used for other purposes; ask your health care provider or pharmacist if you have questions. COMMON BRAND NAME(S): Marinol What should I tell my health care provider before I take this medicine? They need to know if you have any of these conditions: -a history of drug or alcohol abuse -heart disease, including angina or irregular heart rate -high or low blood pressure -dizziness or fainting spells on standing -mental health problems (schizophrenia, mania, depression) -seizures -an unusual or allergic reaction to dronabinol, marijuana, sesame oil, other medicines, foods, dyes, or preservatives -pregnant or trying to get pregnant -breast-feeding How should I use this medicine? Take this medicine by mouth. Follow the directions on the prescription label. Take your doses at regular intervals. Do not take your medicine more often than directed. Talk to your pediatrician regarding the use of this medicine in children. Special care may be needed. Overdosage: If you think you have taken too much of this medicine contact a poison control center or emergency room at once. NOTE: This medicine is only for you. Do not share this medicine with others. What if I miss a dose? If you miss a dose, take it as soon as you can. If it is almost time for your next dose, take only that dose. Do not take double or extra doses. What may interact with this medicine? Do not take this medicine with any of the following medications: -nabilone This medicine may also interact with the following medications: -alcohol-containing medicines or drinks -amphetamine or other stimulant drugs -antihistamines for allergy, cough and  cold -atropine -barbiturates such as phenobarbital -certain medicines for anxiety or sleep -certain medicines for bladder problems like oxybutynin, tolterodine -certain medicines for depression, like amitriptyline, fluoxetine, sertraline -certain medicines for seizures like phenobarbital, primidone -certain medicines for stomach problems like dicyclomine, hyoscyamine -certain medicines for travel sickness like scopolamine -certain medicines for Parkinson's disease like benztropine, trihexyphenidyl -cocaine -disulfiram -general anesthetics like halothane, isoflurane, methoxyflurane, propofol -ipratropium -lithium -local anesthetics like lidocaine, pramoxine, tetracaine -medicines that relax muscles for surgery -narcotic medicines for pain -phenothiazines like chlorpromazine, mesoridazine, prochlorperazine, thioridazine -theophylline This list may not describe all possible interactions. Give your health care provider a list of all the medicines, herbs, non-prescription drugs, or dietary supplements you use. Also tell them if you smoke, drink alcohol, or use illegal drugs. Some items may interact with your medicine. What should I watch for while using this medicine? The first time you take this medicine or have an increase in dose make sure there is a responsible person nearby. You may experience mood changes, easy laughter, or other changes in behavior. You may get drowsy or dizzy. Do not drive, use machinery, or do anything that needs mental alertness until you know how this medicine affects you. Do not stand or sit up quickly, especially if you have low blood pressure or if you are an older patient. This reduces the risk of dizzy or fainting spells. Alcohol may interfere with the effect of this medicine. Avoid alcoholic drinks. If you are taking this medicine to improve your appetite, this is only part of your therapy. Discuss what you can eat and ways of improving your diet with your health  care professional or nutritionist. Frequent small   snacks as well as regular meals can help provide extra calories and protein. Do not smoke marijuana while you are taking this medicine. It is similar to one of the active substances found in marijuana. You are at increased risk of serious heart and/or nervous system side effects if these drugs are used together. What side effects may I notice from receiving this medicine? Side effects that you should report to your doctor or health care professional as soon as possible: -allergic reactions like skin rash, itching or hives, swelling of the face, lips, or tongue -fast, irregular heartbeat -feeling faint or lightheaded, falls -hallucinations -panic reactions -seizures -slurred speech Side effects that usually do not require medical attention (report to your doctor or health care professional if they continue or are bothersome): -anxiety or nervousness -confusion -dizziness -nausea, vomiting or diarrhea -stomach upset -tiredness This list may not describe all possible side effects. Call your doctor for medical advice about side effects. You may report side effects to FDA at 1-800-FDA-1088. Where should I keep my medicine? This medicine may cause accidental overdose and death if taken by other adults, children, or pets. Keep out of the reach of children. This medicine can be abused. Keep your medicine in a safe place to protect it from theft. Do not share this medicine with anyone. Selling or giving away this medicine is dangerous and against the law. Store in a cool place between 8 and 15 degrees C (46 and 59 degrees F) or in a refrigerator. Avoid freezing. Mix any unused medicine with a substance like cat litter or coffee grounds. Then throw the medicine away in a sealed container like a sealed bag or a coffee can with a lid. Do not use the medicine after the expiration date. NOTE: This sheet is a summary. It may not cover all possible information.  If you have questions about this medicine, talk to your doctor, pharmacist, or health care provider.  2018 Elsevier/Gold Standard (2016-07-22 12:09:40)  

## 2018-08-25 LAB — URINALYSIS
Bilirubin, UA: NEGATIVE
Glucose, UA: NEGATIVE
Ketones, UA: NEGATIVE
Leukocytes, UA: NEGATIVE
Nitrite, UA: NEGATIVE
Protein, UA: NEGATIVE
RBC, UA: NEGATIVE
Specific Gravity, UA: 1.018 (ref 1.005–1.030)
Urobilinogen, Ur: 0.2 mg/dL (ref 0.2–1.0)
pH, UA: 6.5 (ref 5.0–7.5)

## 2018-09-03 ENCOUNTER — Telehealth: Payer: Self-pay

## 2018-09-03 NOTE — Telephone Encounter (Signed)
Patient needs a note stating that she is on Marinol for new employment with [employer].

## 2018-09-04 ENCOUNTER — Other Ambulatory Visit: Payer: Self-pay | Admitting: Family Medicine

## 2018-09-04 NOTE — Telephone Encounter (Signed)
Inform patient that Physician's excuse ready for pick up.    Thanks.

## 2018-09-04 NOTE — Progress Notes (Signed)
Physical's excuse sent today.

## 2018-09-05 NOTE — Telephone Encounter (Signed)
Left a vm for patient to callback 

## 2018-09-06 NOTE — Telephone Encounter (Signed)
Patient notified

## 2018-09-19 ENCOUNTER — Ambulatory Visit: Payer: Medicaid Other

## 2018-10-05 ENCOUNTER — Other Ambulatory Visit (HOSPITAL_COMMUNITY): Payer: Self-pay | Admitting: *Deleted

## 2018-10-05 DIAGNOSIS — Z853 Personal history of malignant neoplasm of breast: Secondary | ICD-10-CM

## 2018-10-15 ENCOUNTER — Other Ambulatory Visit: Payer: Self-pay | Admitting: Obstetrics

## 2018-10-15 DIAGNOSIS — N390 Urinary tract infection, site not specified: Secondary | ICD-10-CM

## 2018-10-16 ENCOUNTER — Other Ambulatory Visit: Payer: Self-pay | Admitting: Obstetrics

## 2018-10-16 ENCOUNTER — Telehealth: Payer: Self-pay

## 2018-10-16 MED ORDER — TERCONAZOLE 0.8 % VA CREA: 1.0000 | TOPICAL_CREAM | Freq: Every day | VAGINAL | 0 refills | Status: DC

## 2018-10-16 NOTE — Telephone Encounter (Signed)
Returned call pt would like terconazole sent to pharmacy again, because she never picked it up .

## 2018-10-22 ENCOUNTER — Other Ambulatory Visit: Payer: Self-pay | Admitting: Obstetrics

## 2018-10-23 ENCOUNTER — Telehealth: Payer: Self-pay

## 2018-10-23 NOTE — Telephone Encounter (Signed)
Patient was given information to contact gyn for medication

## 2018-11-20 ENCOUNTER — Ambulatory Visit: Payer: Medicaid Other | Admitting: Obstetrics

## 2018-11-20 ENCOUNTER — Encounter: Payer: Self-pay | Admitting: Obstetrics

## 2018-11-20 ENCOUNTER — Other Ambulatory Visit (HOSPITAL_COMMUNITY)
Admission: RE | Admit: 2018-11-20 | Discharge: 2018-11-20 | Disposition: A | Discharge status: Home / Self Care | Payer: Medicaid Other | Source: Ambulatory Visit | Attending: Obstetrics | Admitting: Obstetrics

## 2018-11-20 VITALS — BP 116/70 | HR 62 | Wt 183.0 lb

## 2018-11-20 DIAGNOSIS — N950 Postmenopausal bleeding: Secondary | ICD-10-CM | POA: Diagnosis not present

## 2018-11-20 NOTE — Addendum Note (Signed)
Addended by: Lewie Loron D on: 11/20/2018 04:06 PM   Modules accepted: Orders

## 2018-11-20 NOTE — Progress Notes (Signed)
Patient ID: Sue [name], female   DOB: 1966/02/19, 53 y.o.   MRN: [id number]  Chief Complaint  Patient presents with  . Gynecologic Exam    HPI Sue [name] is a 53 y.o. female.  History of Breast CA, excised in 2013, followed by radiation and Tamoxifen.  Tamoxifen discontinued this past summer.  She had been having severe hot flashes on Tamoxifen, which continued off Tamoxifen.  She hasn't had a period for 2 years.  One month ago she had a normal period and the hot flashes stopped during this time, but resumed after the period ended.  No further vaginal bleeding after that one episode.  Denies pelvic pain. HPI  Past Medical History:  Diagnosis Date  . Breast cancer ([place]) 08/28/12   Left Breast  . S/P radiation therapy 12/06/12 -01/23/13   Left Breast/Axilla / 46 Gy / 23 Fractions with a Boost to Left Breast / 14 Gy / 7 Fractions  . Use of tamoxifen (Nolvadex) march 2014  . Wears glasses     Past Surgical History:  Procedure Laterality Date  . BREAST LUMPECTOMY Left    takes tamoxifen  . DILATION AND CURETTAGE OF UTERUS     Following Miscarriage  . FOOT FUSION  3/09   ankle rt  . Left Breast Lumpectomy  08/28/12  . 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 Microcalcifications Identified  . RE-EXCISION OF BREAST CANCER,SUPERIOR MARGINS  10/30/2012   Procedure: RE-EXCISION OF BREAST CANCER,SUPERIOR MARGINS;  Surgeon: Adin Hector, MD;  Location: [facility];  Service: General;  Laterality: N/A;  left partial mastectomy with excision of margins  . re-excision of left breast cancer on 09/10/12      Family History  Problem Relation Age of Onset  . Breast cancer Mother 70       blood cancer too  . Breast cancer Cousin        paternal cousin diagnosed; diagnosed in her late 77s  . Brain cancer Paternal Aunt        diagnosed in late 13s to early 67s    Social History Social History   Tobacco Use  . Smoking status: Former Smoker     Types: Cigarettes    Last attempt to quit: 11/30/1993    Years since quitting: 24.9  . Smokeless tobacco: Never Used  Substance Use Topics  . Alcohol use: Yes    Comment: very seldom, 1x per week  . Drug use: No    No Known Allergies  Current Outpatient Medications  Medication Sig Dispense Refill  . dronabinol (MARINOL) 5 MG capsule Take 1 capsule (5 mg total) by mouth 2 (two) times daily before a meal. (Patient not taking: Reported on 11/20/2018) 60 capsule 3  . tamoxifen (NOLVADEX) 10 MG tablet Take 10 mg by mouth daily.     No current facility-administered medications for this visit.     Review of Systems Review of Systems Constitutional: negative for fatigue and weight loss Respiratory: negative for cough and wheezing Cardiovascular: negative for chest pain, fatigue and palpitations Gastrointestinal: negative for abdominal pain and change in bowel habits Genitourinary:positive for one episode of vaginal bleeding like a period Integument/breast: negative for nipple discharge Musculoskeletal:negative for myalgias Neurological: negative for gait problems and tremors Behavioral/Psych: negative for abusive relationship, depression Endocrine: positive for hot flashes    Blood pressure 116/70, pulse 62, weight 183 lb (83 kg), last menstrual period 10/24/2018.  Physical Exam  Physical Exam           General:  Alert and no distress Abdomen:  normal findings: no organomegaly, soft, non-tender and no hernia  Pelvis:  External genitalia: normal general appearance Urinary system: urethral meatus normal and bladder without fullness, nontender Vaginal: normal without tenderness, induration or masses Cervix: normal appearance.  Bleeding from cervix with introduction of swab for endocervical culture for GC / CH Adnexa: normal bimanual exam Uterus: anteverted and non-tender, normal size    50% of 15 min visit spent on counseling and coordination of care.   Data Reviewed Wet  Prep Cultures  Assessment     1. Postmenopausal bleeding.  ? Endocervical Polyp Rx: - US PELVIC COMPLETE WITH TRANSVAGINAL; Future  2. History of Breast CA    Plan    Follow up in 2 weeks for Endometrial Biopsy  Orders Placed This Encounter  Procedures  . US PELVIC COMPLETE WITH TRANSVAGINAL    Standing Status:   Future    Standing Expiration Date:   01/19/2020    Order Specific Question:   Reason for Exam (SYMPTOM  OR DIAGNOSIS REQUIRED)    Answer:   Postmenopausal Bleeding    Order Specific Question:   Preferred imaging location?    Answer:   [facility]   No orders of the defined types were placed in this encounter.   Shelly Bombard MD 11-20-2018

## 2018-11-20 NOTE — Progress Notes (Signed)
Pt states full cycle 12/11-19. Pt states she has not had cycle in 2 years prior.

## 2018-11-20 NOTE — Patient Instructions (Signed)
Postmenopausal Bleeding  Postmenopausal bleeding is any bleeding that a woman has after she has entered into menopause. Menopause is the end of a woman's fertile years. After menopause, a woman no longer ovulates and does not have menstrual periods. Postmenopausal bleeding may have various causes, including:  Menopausal hormone therapy (MHT).  Endometrial atrophy. After menopause, low estrogen hormone levels cause the membrane that lines the uterus (endometrium) to become thinner. You may have bleeding as the endometrium thins.  Endometrial hyperplasia. This condition is caused by excess estrogen hormones and low levels of progesterone hormones. The excess estrogen causes the endometrium to thicken, which can lead to bleeding. In some cases, this can lead to cancer of the uterus.  Endometrial cancer.  Non-cancerous growths (polyps) on the endometrium, the lining of the uterus, or the cervix.  Uterine fibroids. These are non-cancerous growths in or around the uterus muscle tissue that can cause heavy bleeding. Any type of postmenopausal bleeding, even if it appears to be a typical menstrual period, should be evaluated by your health care provider. Treatment will depend on the cause of the bleeding. Follow these instructions at home:  Pay attention to any changes in your symptoms.  Avoid using tampons and douches as told by your health care provider.  Change your pads regularly.  Get regular pelvic exams and Pap tests.  Take iron supplements as told by your health care provider.  Take over-the-counter and prescription medicines only as told by your health care provider.  Keep all follow-up visits as told by your health care provider. This is important. Contact a health care provider if:  Your bleeding lasts more than 1 week.  You have abdominal pain.  You have bleeding with or after sexual intercourse.  You have bleeding that happens more often than every 3 weeks. Get help  right away if:  You have a fever, chills, headache, dizziness, muscle aches, and bleeding.  You have severe pain with bleeding.  You are passing blood clots.  You have heavy bleeding, need more than 1 pad an hour, and have never experienced this before.  You feel faint. Summary  Postmenopausal bleeding is any bleeding that a woman has after she has entered into menopause.  Postmenopausal bleeding may have various causes. Treatment will depend on the cause of the bleeding.  Any type of postmenopausal bleeding, even if it appears to be a typical menstrual period, should be evaluated by your health care provider.  Be sure to pay attention to any changes in your symptoms and keep all follow-up visits as told by your health care provider. This information is not intended to replace advice given to you by your health care provider. Make sure you discuss any questions you have with your health care provider. Document Released: 02/08/2006 Document Revised: 01/24/2017 Document Reviewed: 01/24/2017 Elsevier Interactive Patient Education  2019 [employer].  Endometrial Biopsy  Endometrial biopsy is a procedure in which a tissue sample is taken from inside the uterus. The sample is taken from the endometrium, which is the lining of the uterus. The tissue sample is then checked under a microscope to see if the tissue is normal or abnormal. This procedure helps to determine where you are in your menstrual cycle and how hormone levels are affecting the lining of the uterus. This procedure may also be used to evaluate uterine bleeding or to diagnose endometrial cancer, endometrial tuberculosis, polyps, or other inflammatory conditions. Tell a health care provider about:  Any allergies you have.  All  medicines you are taking, including vitamins, herbs, eye drops, creams, and over-the-counter medicines.  Any problems you or family members have had with anesthetic medicines.  Any blood disorders  you have.  Any surgeries you have had.  Any medical conditions you have.  Whether you are pregnant or may be pregnant. What are the risks? Generally, this is a safe procedure. However, problems may occur, including:  Bleeding.  Pelvic infection.  Puncture of the wall of the uterus with the biopsy device (rare). What happens before the procedure?  Keep a record of your menstrual cycles as told by your health care provider. You may need to schedule your procedure for a specific time in your cycle.  You may want to bring a sanitary pad to wear after the procedure.  Ask your health care provider about: ? Changing or stopping your regular medicines. This is especially important if you are taking diabetes medicines or blood thinners. ? Taking medicines such as aspirin and ibuprofen. These medicines can thin your blood. Do not take these medicines before your procedure if your health care provider instructs you not to.  Plan to have someone take you home from the hospital or clinic. What happens during the procedure?  To lower your risk of infection: ? Your health care team will wash or sanitize their hands.  You will lie on an exam table with your feet and legs supported as in a pelvic exam.  Your health care provider will insert an instrument (speculum) into your vagina to see your cervix.  Your cervix will be cleansed with an antiseptic solution.  A medicine (local anesthetic) will be used to numb the cervix.  A forceps instrument (tenaculum) will be used to hold your cervix steady for the biopsy.  A thin, rod-like instrument (uterine sound) will be inserted through your cervix to determine the length of your uterus and the location where the biopsy sample will be removed.  A thin, flexible tube (catheter) will be inserted through your cervix and into the uterus. The catheter will be used to collect the biopsy sample from your endometrial tissue.  The catheter and speculum  will then be removed, and the tissue sample will be sent to a lab for examination. What happens after the procedure?  You will rest in a recovery area until you are ready to go home.  You may have mild cramping and a small amount of vaginal bleeding. This is normal.  It is up to you to get the results of your procedure. Ask your health care provider, or the department that is doing the procedure, when your results will be ready. Summary  Endometrial biopsy is a procedure in which a tissue sample is taken from the endometrium, which is the lining of the uterus.  This procedure may help to diagnose menstrual cycle problems, abnormal bleeding, or other conditions affecting the endometrium.  Before the procedure, keep a record of your menstrual cycles as told by your health care provider.  The tissue sample that is removed will be checked under a microscope to see if it is normal or abnormal. This information is not intended to replace advice given to you by your health care provider. Make sure you discuss any questions you have with your health care provider. Document Released: 03/03/2005 Document Revised: 11/16/2016 Document Reviewed: 11/16/2016 Elsevier Interactive Patient Education  2019 [employer].  Endometrial Biopsy, Care After This sheet gives you information about how to care for yourself after your procedure. Your health  care provider may also give you more specific instructions. If you have problems or questions, contact your health care provider. What can I expect after the procedure? After the procedure, it is common to have:  Mild cramping.  A small amount of vaginal bleeding for a few days. This is normal. Follow these instructions at home:   Take over-the-counter and prescription medicines only as told by your health care provider.  Do not douche, use tampons, or have sexual intercourse until your health care provider approves.  Return to your normal activities as  told by your health care provider. Ask your health care provider what activities are safe for you.  Follow instructions from your health care provider about any activity restrictions, such as restrictions on strenuous exercise or heavy lifting. Contact a health care provider if:  You have heavy bleeding, or bleed for longer than 2 days after the procedure.  You have bad smelling discharge from your vagina.  You have a fever or chills.  You have a burning sensation when urinating or you have difficulty urinating.  You have severe pain in your lower abdomen. Get help right away if:  You have severe cramps in your stomach or back.  You pass large blood clots.  Your bleeding increases.  You become weak or light-headed, or you pass out. Summary  After the procedure, it is common to have mild cramping and a small amount of vaginal bleeding for a few days.  Do not douche, use tampons, or have sexual intercourse until your health care provider approves.  Return to your normal activities as told by your health care provider. Ask your health care provider what activities are safe for you. This information is not intended to replace advice given to you by your health care provider. Make sure you discuss any questions you have with your health care provider. Document Released: 08/21/2013 Document Revised: 11/16/2016 Document Reviewed: 11/16/2016 Elsevier Interactive Patient Education  2019 [employer].

## 2018-11-21 LAB — CERVICOVAGINAL ANCILLARY ONLY
BACTERIAL VAGINITIS: POSITIVE — AB
Candida vaginitis: NEGATIVE
Chlamydia: NEGATIVE
Neisseria Gonorrhea: NEGATIVE
Trichomonas: NEGATIVE

## 2018-11-22 ENCOUNTER — Other Ambulatory Visit: Payer: Self-pay | Admitting: Obstetrics

## 2018-11-22 DIAGNOSIS — N760 Acute vaginitis: Principal | ICD-10-CM

## 2018-11-22 DIAGNOSIS — B9689 Other specified bacterial agents as the cause of diseases classified elsewhere: Secondary | ICD-10-CM

## 2018-11-22 MED ORDER — TINIDAZOLE 500 MG PO TABS: 1000.0000 mg | ORAL_TABLET | Freq: Every day | ORAL | 2 refills | Status: DC

## 2018-11-26 ENCOUNTER — Ambulatory Visit: Payer: Medicaid Other | Admitting: Family Medicine

## 2018-11-26 ENCOUNTER — Ambulatory Visit (HOSPITAL_COMMUNITY)
Admission: RE | Admit: 2018-11-26 | Discharge: 2018-11-26 | Disposition: A | Discharge status: Home / Self Care | Payer: Medicaid Other | Source: Ambulatory Visit | Attending: Obstetrics | Admitting: Obstetrics

## 2018-11-26 DIAGNOSIS — D252 Subserosal leiomyoma of uterus: Secondary | ICD-10-CM | POA: Diagnosis not present

## 2018-11-26 DIAGNOSIS — N950 Postmenopausal bleeding: Secondary | ICD-10-CM | POA: Diagnosis not present

## 2018-12-11 ENCOUNTER — Other Ambulatory Visit (HOSPITAL_COMMUNITY)
Admission: RE | Admit: 2018-12-11 | Discharge: 2018-12-11 | Disposition: A | Discharge status: Home / Self Care | Payer: Medicaid Other | Source: Ambulatory Visit | Attending: Obstetrics | Admitting: Obstetrics

## 2018-12-11 ENCOUNTER — Ambulatory Visit: Payer: Medicaid Other | Admitting: Obstetrics

## 2018-12-11 ENCOUNTER — Encounter: Payer: Self-pay | Admitting: Obstetrics

## 2018-12-11 VITALS — BP 133/80 | HR 62 | Resp 16 | Ht 70.0 in | Wt 179.8 lb

## 2018-12-11 DIAGNOSIS — N950 Postmenopausal bleeding: Secondary | ICD-10-CM | POA: Diagnosis not present

## 2018-12-11 DIAGNOSIS — N840 Polyp of corpus uteri: Secondary | ICD-10-CM

## 2018-12-11 DIAGNOSIS — Z01818 Encounter for other preprocedural examination: Secondary | ICD-10-CM

## 2018-12-11 DIAGNOSIS — Z3202 Encounter for pregnancy test, result negative: Secondary | ICD-10-CM | POA: Diagnosis not present

## 2018-12-11 LAB — POCT URINE PREGNANCY: Preg Test, Ur: NEGATIVE

## 2018-12-11 NOTE — Addendum Note (Signed)
Addended by: Tommas Olp B on: 12/11/2018 10:28 AM   Modules accepted: Orders

## 2018-12-11 NOTE — Progress Notes (Signed)
Endometrial Biopsy Procedure Note  Pre-operative Diagnosis: AUB  Post-operative Diagnosis: same  Indications: abnormal uterine bleeding  Procedure Details   Urine pregnancy test was done in office and result was negative.  The risks (including infection, bleeding, pain, and uterine perforation) and benefits of the procedure were explained to the patient and Written informed consent was obtained.    The patient was placed in the dorsal lithotomy position.  Bimanual exam showed the uterus to be in the neutral position.  A Graves' speculum inserted in the vagina, and the cervix prepped with povidone iodine.  Endocervical curettage with a Kevorkian curette was not performed.   A sharp tenaculum was applied to the anterior lip of the cervix for stabilization.  A sterile uterine sound was used to sound the uterus to a depth of 8cm.  A Pipelle endometrial aspirator was used to sample the endometrium.  Sample was sent for pathologic examination.  Condition: Stable  Complications: None  Plan:  The patient was advised to call for any fever or for prolonged or severe pain or bleeding. She was advised to use NSAID as needed for mild to moderate pain. She was advised to avoid vaginal intercourse for 48 hours or until the bleeding has completely stopped.  Attending Physician Documentation: I was present for or participated in the entire procedure, including opening and closing.    Shelly Bombard MD 12-11-2018

## 2018-12-14 ENCOUNTER — Encounter: Payer: Self-pay | Admitting: Obstetrics

## 2018-12-25 ENCOUNTER — Ambulatory Visit: Payer: Medicaid Other | Admitting: Obstetrics

## 2019-01-10 ENCOUNTER — Ambulatory Visit
Admission: RE | Admit: 2019-01-10 | Discharge: 2019-01-10 | Disposition: A | Discharge status: Home / Self Care | Payer: Medicaid Other | Source: Ambulatory Visit | Attending: Oncology | Admitting: Oncology

## 2019-01-10 ENCOUNTER — Ambulatory Visit (HOSPITAL_COMMUNITY)
Admission: RE | Admit: 2019-01-10 | Discharge: 2019-01-10 | Disposition: A | Discharge status: Home / Self Care | Payer: Medicaid Other | Source: Ambulatory Visit | Attending: Obstetrics and Gynecology | Admitting: Obstetrics and Gynecology

## 2019-01-10 ENCOUNTER — Ambulatory Visit: Payer: Medicaid Other

## 2019-01-10 ENCOUNTER — Other Ambulatory Visit: Payer: Self-pay | Admitting: Family Medicine

## 2019-01-10 ENCOUNTER — Encounter (HOSPITAL_COMMUNITY): Payer: Self-pay

## 2019-01-10 VITALS — BP 110/78

## 2019-01-10 DIAGNOSIS — Z1231 Encounter for screening mammogram for malignant neoplasm of breast: Secondary | ICD-10-CM

## 2019-01-10 DIAGNOSIS — Z1239 Encounter for other screening for malignant neoplasm of breast: Secondary | ICD-10-CM

## 2019-01-10 NOTE — Progress Notes (Signed)
Patient referred to [facility] by the [facility] due to recommending a diagnostic mammogram in one year. Last diagnostic mammogram was completed 07/14/2017.  Pap Smear: Pap smear not completed today. Last Pap smear was 07/02/2018 at [facility] and normal with negative HPV. Per patient has no history of an abnormal Pap smear. Last two Pap smear results are in Epic.  Physical exam: Breasts Right breast larger than left breast due to patient has a history of a left breast lumpectomy in 2013. No skin abnormalities bilateral breasts. No nipple retraction bilateral breasts. No nipple discharge bilateral breasts. No lymphadenopathy. No lumps palpated bilateral breasts. No complaints of pain or tenderness on exam. Referred patient to the [facility] for a diagnostic mammogram per recommendation. Appointment scheduled for Thursday, January 10, 2019 at 1240.        Pelvic/Bimanual No Pap smear completed today since last Pap smear and HPV typing was 07/02/2018. Pap smear not indicated per BCCCP guidelines.   Smoking History: Patient is a former smoker that quit in 1995.  Patient Navigation: Patient education provided. Access to services provided for patient through [place] program.   Colorectal Cancer Screening: Per patient has never had a colonoscopy completed. No complaints today. FIT Test given to patient to complete and return to BCCCP.  Breast and Cervical Cancer Risk Assessment: Patient has a family history of her mother having breast cancer. Patient has a personal history of breast cancer. Patient has no known genetic mutations or history of radiation treatment to the chest before age 89. Patient has no history of cervical dysplasia, immunocompromised, or DES exposure in-utero.  Risk Assessment    Risk Scores      01/10/2019   Last edited by: Armond Hang, LPN   5-year risk: 1.5 %   Lifetime risk: 10.3 %

## 2019-01-10 NOTE — Patient Instructions (Signed)
Explained breast self awareness with [name]. Patient did not need a Pap smear today due to last Pap smear and HPV typing was 07/02/2018. Let her know BCCCP will cover Pap smears and HPV typing every 5 years unless has a history of abnormal Pap smears. Referred patient to the [facility] for a diagnostic mammogram per recommendation. Appointment scheduled for Thursday, January 10, 2019 at 1240. Patient aware of appointment and will be there. [name] verbalized understanding.  Jerris Keltz, Arvil [place], RN 11:03 AM

## 2019-01-14 ENCOUNTER — Encounter: Payer: Self-pay | Admitting: Family Medicine

## 2019-01-14 ENCOUNTER — Ambulatory Visit (INDEPENDENT_AMBULATORY_CARE_PROVIDER_SITE_OTHER): Payer: BLUE CROSS/BLUE SHIELD | Admitting: Family Medicine

## 2019-01-14 VITALS — BP 146/90 | HR 64 | Temp 97.7°F | Ht 70.0 in | Wt 181.2 lb

## 2019-01-14 DIAGNOSIS — R232 Flushing: Secondary | ICD-10-CM | POA: Diagnosis not present

## 2019-01-14 DIAGNOSIS — Z09 Encounter for follow-up examination after completed treatment for conditions other than malignant neoplasm: Secondary | ICD-10-CM

## 2019-01-14 DIAGNOSIS — T451X5A Adverse effect of antineoplastic and immunosuppressive drugs, initial encounter: Secondary | ICD-10-CM

## 2019-01-14 DIAGNOSIS — Z23 Encounter for immunization: Secondary | ICD-10-CM | POA: Diagnosis not present

## 2019-01-14 DIAGNOSIS — C50412 Malignant neoplasm of upper-outer quadrant of left female breast: Secondary | ICD-10-CM | POA: Diagnosis not present

## 2019-01-14 DIAGNOSIS — R630 Anorexia: Secondary | ICD-10-CM | POA: Diagnosis not present

## 2019-01-14 DIAGNOSIS — Z170 Estrogen receptor positive status [ER+]: Secondary | ICD-10-CM | POA: Diagnosis not present

## 2019-01-14 DIAGNOSIS — Z0000 Encounter for general adult medical examination without abnormal findings: Secondary | ICD-10-CM

## 2019-01-14 LAB — POCT URINALYSIS DIP (MANUAL ENTRY)
Bilirubin, UA: NEGATIVE
Ketones, POC UA: NEGATIVE mg/dL
Leukocytes, UA: NEGATIVE
Nitrite, UA: NEGATIVE
Protein Ur, POC: NEGATIVE mg/dL
Spec Grav, UA: 1.02 (ref 1.010–1.025)
Urobilinogen, UA: 0.2 E.U./dL
pH, UA: 6 (ref 5.0–8.0)

## 2019-01-14 NOTE — Progress Notes (Signed)
Patient [facility]   Established Patient Office Visit  Subjective:  Patient ID: Sue [name], female    DOB: January 29, 1966  Age: 53 y.o. MRN: [id number]  CC:  Chief Complaint  Patient presents with  . Paperwork    labs    HPI Sue [name] is a 53 year old female who presents for follow up.   Past Medical History:  Diagnosis Date  . Breast cancer ([place]) 08/28/12   Left Breast  . S/P radiation therapy 12/06/12 -01/23/13   Left Breast/Axilla / 46 Gy / 23 Fractions with a Boost to Left Breast / 14 Gy / 7 Fractions  . Use of tamoxifen (Nolvadex) march 2014  . Wears glasses    Current Status: Since her last office visit, she is doing well with no complaints. She has recently discontinue Tamoxifen with 5 year completion of therapy. He hot flashes are at minimal. She is here for immunizations.   She denies fevers, chills, fatigue, recent infections, weight loss, and night sweats. She has not had any headaches, visual changes, dizziness, and falls. No chest pain, heart palpitations, cough and shortness of breath reported. No reports of GI problems such as nausea, vomiting, diarrhea, and constipation. She has no reports of blood in stools, dysuria and hematuria. No depression or anxiety reported.  She denies pain today.   Past Surgical History:  Procedure Laterality Date  . BREAST LUMPECTOMY Left    takes tamoxifen  . DILATION AND CURETTAGE OF UTERUS     Following Miscarriage  . FOOT FUSION  3/09   ankle rt  . Left Breast Lumpectomy  08/28/12  . 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 Microcalcifications Identified  . RE-EXCISION OF BREAST CANCER,SUPERIOR MARGINS  10/30/2012   Procedure: RE-EXCISION OF BREAST CANCER,SUPERIOR MARGINS;  Surgeon: Adin Hector, MD;  Location: [facility];  Service: General;  Laterality: N/A;  left partial mastectomy with excision of margins  . re-excision of left  breast cancer on 09/10/12      Family History  Problem Relation Age of Onset  . Breast cancer Mother 73       blood cancer too  . Breast cancer Cousin        paternal cousin diagnosed; diagnosed in her late 27s  . Brain cancer Paternal Aunt        diagnosed in late 3s to early 63s    Social History   Socioeconomic History  . Marital status: Married    Spouse name: Not on file  . Number of children: 1  . Years of education: Not on file  . Highest education level: Bachelor's degree (e.g., BA, AB, BS)  Occupational History  . Not on file  Social Needs  . Financial resource strain: Not on file  . Food insecurity:    Worry: Not on file    Inability: Not on file  . Transportation needs:    Medical: No    Non-medical: No  Tobacco Use  . Smoking status: Former Smoker    Types: Cigarettes    Last attempt to quit: 11/30/1993    Years since quitting: 25.1  . Smokeless tobacco: Never Used  Substance and Sexual Activity  . Alcohol use: Yes    Comment: very seldom, 1x per week  . Drug use: No  . Sexual activity: Yes    Comment: Menarche age 60, Parity age , G62P1, Premenopausal,  No HRT  Lifestyle  . Physical activity:    Days per week: Not on file    Minutes per session: Not on file  . Stress: Not on file  Relationships  . Social connections:    Talks on phone: Not on file    Gets together: Not on file    Attends religious service: Not on file    Active member of club or organization: Not on file    Attends meetings of clubs or organizations: Not on file    Relationship status: Not on file  . Intimate partner violence:    Fear of current or ex partner: Not on file    Emotionally abused: Not on file    Physically abused: Not on file    Forced sexual activity: Not on file  Other Topics Concern  . Not on file  Social History Narrative  . Not on file    No outpatient medications prior to visit.   No facility-administered medications prior to visit.     No Known  Allergies  ROS Review of Systems  Constitutional: Negative.   HENT: Negative.   Eyes: Negative.   Respiratory: Negative.   Cardiovascular: Negative.   Gastrointestinal: Negative.   Endocrine: Negative.   Genitourinary: Negative.   Musculoskeletal: Negative.   Skin: Negative.   Allergic/Immunologic: Negative.   Neurological: Negative.        Hot flashes.   Hematological: Negative.   Psychiatric/Behavioral: Negative.    Objective:    Physical Exam  Constitutional: She is oriented to person, place, and time. She appears well-developed and well-nourished.  HENT:  Head: Normocephalic and atraumatic.  Eyes: Conjunctivae are normal.  Neck: Normal range of motion. Neck supple.  Cardiovascular: Normal rate, regular rhythm, normal heart sounds and intact distal pulses.  Pulmonary/Chest: Effort normal and breath sounds normal.  Abdominal: Soft. Bowel sounds are normal.  Musculoskeletal: Normal range of motion.  Neurological: She is alert and oriented to person, place, and time. She has normal reflexes.  Skin: Skin is warm and dry.  Psychiatric: She has a normal mood and affect. Her behavior is normal. Judgment and thought content normal.  Nursing note and vitals reviewed.   BP (!) 146/90 (BP Location: Left Arm, Patient Position: Sitting, Cuff Size: Small)   Pulse 64   Temp 97.7 F (36.5 C) (Oral)   Ht _0  (1.778 m)   Wt 181 lb 3.2 oz (82.2 kg)   LMP 10/24/2018   SpO2 100%   BMI 26.00 kg/m  Wt Readings from Last 3 Encounters:  01/14/19 181 lb 3.2 oz (82.2 kg)  12/11/18 179 lb 12.8 oz (81.6 kg)  11/20/18 183 lb (83 kg)     Health Maintenance Due  Topic Date Due  . COLONOSCOPY  04/20/2017    There are no preventive care reminders to display for this patient.  No results found for: TSH Lab Results  Component Value Date   WBC 3.2 (L) 07/05/2018   HGB 13.0 07/05/2018   HCT 39.1 07/05/2018   MCV 101.3 (H) 07/05/2018   PLT 154 07/05/2018   Lab Results    Component Value Date   NA 143 07/05/2018   K 3.5 07/05/2018   CHLORIDE 107 07/05/2017   CO2 27 07/05/2018   GLUCOSE 106 (H) 07/05/2018   BUN 15 07/05/2018   CREATININE 0.74 07/05/2018   BILITOT <0.2 (L) 07/05/2018   ALKPHOS 78 07/05/2018   AST 16 07/05/2018   ALT 12 07/05/2018   PROT 6.8  07/05/2018   ALBUMIN 3.6 07/05/2018   CALCIUM 9.1 07/05/2018   ANIONGAP 7 07/05/2018   EGFR >90 07/05/2017   No results found for: CHOL No results found for: HDL No results found for: LDLCALC No results found for: TRIG No results found for: [facility] Lab Results  Component Value Date   HGBA1C 5.0 08/24/2018   Assessment & Plan:   1. Malignant neoplasm of upper-outer quadrant of left breast in female, estrogen receptor positive ([place]) Stable. She has completed treatments and is doing well.   2. Loss of appetite Good.   3. Hot flashes due to tamoxifen She has recently discontinued Tamoxifen. Hot flashes are minimal at this time.   4. Healthcare maintenance - Measles/Mumps/Rubella Immunity  5. Need for immunization against influenza - Flu Vaccine QUAD 36+ mos IM  6. Follow up She will follow up in 6 months.  - POCT urinalysis dipstick   No orders of the defined types were placed in this encounter.  Orders Placed This Encounter  Procedures  . Flu Vaccine QUAD 36+ mos IM  . Measles/Mumps/Rubella Immunity  . POCT urinalysis dipstick    Referral Orders  No referral(s) requested today   Kathe Becton,  MSN, FNP-C [facility] [address] [place], [place] [id number] [phone]   Problem List Items Addressed This Visit      Other   Malignant neoplasm of upper-outer quadrant of left breast in female, estrogen receptor positive ([place]) - Primary    Other Visit Diagnoses    Loss of appetite       Hot flashes due to tamoxifen       Follow up       Relevant Orders   POCT urinalysis dipstick (Completed)   Healthcare maintenance        Relevant Orders   Measles/Mumps/Rubella Immunity   Need for immunization against influenza       Relevant Orders   Flu Vaccine QUAD 36+ mos IM (Completed)      No orders of the defined types were placed in this encounter.   Follow-up: Return in about 6 months (around 07/17/2019).    Azzie Glatter, FNP

## 2019-01-15 LAB — MEASLES/MUMPS/RUBELLA IMMUNITY
MUMPS ABS, IGG: >300 AU/mL (ref >10.9)
RUBEOLA AB, IGG: >300 AU/mL (ref >16.4)
Rubella Antibodies, IGG: 27 index (ref >0.99)

## 2019-01-16 ENCOUNTER — Encounter (HOSPITAL_COMMUNITY): Payer: Self-pay | Admitting: *Deleted

## 2019-01-18 ENCOUNTER — Telehealth: Payer: Self-pay

## 2019-01-18 NOTE — Telephone Encounter (Signed)
Patient notified

## 2019-06-28 DIAGNOSIS — H16223 Keratoconjunctivitis sicca, not specified as Sjogren's, bilateral: Secondary | ICD-10-CM | POA: Diagnosis not present

## 2019-06-28 DIAGNOSIS — H40033 Anatomical narrow angle, bilateral: Secondary | ICD-10-CM | POA: Diagnosis not present

## 2019-07-04 ENCOUNTER — Ambulatory Visit: Payer: BLUE CROSS/BLUE SHIELD | Admitting: Certified Nurse Midwife

## 2019-07-17 ENCOUNTER — Ambulatory Visit: Payer: Medicaid Other | Admitting: Family Medicine

## 2019-08-15 DIAGNOSIS — H5213 Myopia, bilateral: Secondary | ICD-10-CM | POA: Diagnosis not present

## 2019-11-07 DIAGNOSIS — M7918 Myalgia, other site: Secondary | ICD-10-CM | POA: Diagnosis not present

## 2019-11-07 DIAGNOSIS — R0789 Other chest pain: Secondary | ICD-10-CM | POA: Diagnosis not present

## 2019-11-12 DIAGNOSIS — M7989 Other specified soft tissue disorders: Secondary | ICD-10-CM | POA: Diagnosis not present

## 2019-11-12 DIAGNOSIS — S8991XA Unspecified injury of right lower leg, initial encounter: Secondary | ICD-10-CM | POA: Diagnosis not present

## 2019-11-12 DIAGNOSIS — M25571 Pain in right ankle and joints of right foot: Secondary | ICD-10-CM | POA: Diagnosis not present

## 2019-11-29 DIAGNOSIS — L089 Local infection of the skin and subcutaneous tissue, unspecified: Secondary | ICD-10-CM | POA: Diagnosis not present

## 2019-11-29 DIAGNOSIS — L729 Follicular cyst of the skin and subcutaneous tissue, unspecified: Secondary | ICD-10-CM | POA: Diagnosis not present

## 2019-12-31 ENCOUNTER — Other Ambulatory Visit: Payer: Self-pay | Admitting: Family Medicine

## 2019-12-31 DIAGNOSIS — Z1231 Encounter for screening mammogram for malignant neoplasm of breast: Secondary | ICD-10-CM

## 2020-01-24 ENCOUNTER — Other Ambulatory Visit: Payer: Self-pay

## 2020-01-24 ENCOUNTER — Ambulatory Visit (INDEPENDENT_AMBULATORY_CARE_PROVIDER_SITE_OTHER): Payer: BLUE CROSS/BLUE SHIELD | Admitting: Obstetrics

## 2020-01-24 ENCOUNTER — Encounter: Payer: Self-pay | Admitting: Obstetrics

## 2020-01-24 VITALS — BP 123/84 | HR 58 | Ht 70.0 in | Wt 178.0 lb

## 2020-01-24 DIAGNOSIS — N898 Other specified noninflammatory disorders of vagina: Secondary | ICD-10-CM | POA: Diagnosis not present

## 2020-01-24 DIAGNOSIS — E2839 Other primary ovarian failure: Secondary | ICD-10-CM

## 2020-01-24 DIAGNOSIS — Z1501 Genetic susceptibility to malignant neoplasm of breast: Secondary | ICD-10-CM | POA: Diagnosis not present

## 2020-01-24 DIAGNOSIS — Z01419 Encounter for gynecological examination (general) (routine) without abnormal findings: Secondary | ICD-10-CM | POA: Diagnosis not present
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
## 2020-01-24 NOTE — Progress Notes (Signed)
Subjective:        Sue [name] is a 54 y.o. female here for a routine exam.  Current complaints: None.    Personal health questionnaire:  Is patient Ashkenazi Jewish, have a family history of breast and/or ovarian cancer: yes Is there a family history of uterine cancer diagnosed at age < 49, gastrointestinal cancer, urinary tract cancer, family member who is a Field seismologist syndrome-associated carrier: no Is the patient overweight and hypertensive, family history of diabetes, personal history of gestational diabetes, preeclampsia or PCOS: no Is patient over 26, have PCOS,  family history of premature CHD under age 69, diabetes, smoke, have hypertension or peripheral artery disease:  no At any time, has a partner hit, kicked or otherwise hurt or frightened you?: no Over the past 2 weeks, have you felt down, depressed or hopeless?: no Over the past 2 weeks, have you felt little interest or pleasure in doing things?:no   Gynecologic History Patient's last menstrual period was 10/24/2018. Contraception: post menopausal status Last Pap: 12-11-2017. Results were: normal Last mammogram: 01-10-2019. Results were: normal  Obstetric History OB History  Gravida Para Term Preterm AB Living  2 2 1     1   SAB TAB Ectopic Multiple Live Births          1    # Outcome Date GA Lbr Len/2nd Weight Sex Delivery Anes PTL Lv  2 Term 01/06/06    F Vag-Spont   LIV  1 Para             Past Medical History:  Diagnosis Date  . Breast cancer ([place]) 08/28/12   Left Breast  . S/P radiation therapy 12/06/12 -01/23/13   Left Breast/Axilla / 46 Gy / 23 Fractions with a Boost to Left Breast / 14 Gy / 7 Fractions  . Use of tamoxifen (Nolvadex) march 2014  . Wears glasses     Past Surgical History:  Procedure Laterality Date  . BREAST LUMPECTOMY Left    takes tamoxifen  . DILATION AND CURETTAGE OF UTERUS     Following Miscarriage  . FOOT FUSION  3/09   ankle rt  . Left Breast Lumpectomy  08/28/12  . 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 Microcalcifications Identified  . RE-EXCISION OF BREAST CANCER,SUPERIOR MARGINS  10/30/2012   Procedure: RE-EXCISION OF BREAST CANCER,SUPERIOR MARGINS;  Surgeon: Adin Hector, MD;  Location: [facility];  Service: General;  Laterality: N/A;  left partial mastectomy with excision of margins  . re-excision of left breast cancer on 09/10/12      No current outpatient medications on file. No Known Allergies  Social History   Tobacco Use  . Smoking status: Former Smoker    Types: Cigarettes    Quit date: 11/30/1993    Years since quitting: 26.1  . Smokeless tobacco: Never Used  Substance Use Topics  . Alcohol use: Not Currently    Comment: very seldom, 1x per week    Family History  Problem Relation Age of Onset  . Breast cancer Mother 33       blood cancer too  . Breast cancer Cousin        paternal cousin diagnosed; diagnosed in her late 49s  . Brain cancer Paternal Aunt        diagnosed in late 9s to early 77s      Review of Systems  Constitutional: negative for fatigue and weight loss Respiratory:  negative for cough and wheezing Cardiovascular: negative for chest pain, fatigue and palpitations Gastrointestinal: negative for abdominal pain and change in bowel habits Musculoskeletal:negative for myalgias Neurological: negative for gait problems and tremors Behavioral/Psych: negative for abusive relationship, depression Endocrine: negative for temperature intolerance    Genitourinary:negative for abnormal menstrual periods, genital lesions, hot flashes, sexual problems and vaginal discharge Integument/breast: negative for breast lump, breast tenderness, nipple discharge and skin lesion(s)    Objective:       BP 123/84   Pulse (!) 58   Ht 5\' 10"  (1.778 m)   Wt 178 lb (80.7 kg)   LMP 10/24/2018   BMI 25.54 kg/m  General:   alert  Skin:   no rash or abnormalities  Lungs:   clear to  auscultation bilaterally  Heart:   regular rate and rhythm, S1, S2 normal, no murmur, click, rub or gallop  Breasts:   normal without suspicious masses, skin or nipple changes or axillary nodes  Abdomen:  normal findings: no organomegaly, soft, non-tender and no hernia  Pelvis:  External genitalia: normal general appearance Urinary system: urethral meatus normal and bladder without fullness, nontender Vaginal: normal without tenderness, induration or masses Cervix: normal appearance Adnexa: normal bimanual exam Uterus: anteverted and non-tender, normal size   Lab Review Urine pregnancy test Labs reviewed yes Radiologic studies reviewed yes  50% of 25 min visit spent on counseling and coordination of care.   Assessment:   1. Encounter for routine gynecological examination with Papanicolaou smear of cervix Rx: - Cytology - PAP( [place])  2. Vaginal discharge Rx: - Cervicovaginal ancillary only  3. Hypoestrogenism Rx: - DG BONE DENSITY (DXA); Future  4. Breast cancer genetic susceptibility Rx: - BRCAssure Comprehensive Panel; Future    Plan:    Education reviewed: calcium supplements, depression evaluation, low fat, low cholesterol diet, safe sex/STD prevention, self breast exams and weight bearing exercise. Follow up in: 1 year.   No orders of the defined types were placed in this encounter.  Orders Placed This Encounter  Procedures  . DG BONE DENSITY (DXA)    Standing Status:   Future    Standing Expiration Date:   03/25/2021    Order Specific Question:   Reason for Exam (SYMPTOM  OR DIAGNOSIS REQUIRED)    Answer:   Hypoestrogenism    Order Specific Question:   Is the patient pregnant?    Answer:   No    Order Specific Question:   Preferred imaging location?    Answer:   [facility]  . BRCAssure Comprehensive Panel    Standing Status:   Future    Standing Expiration Date:   01/23/2021    Shelly Bombard, MD 01/24/2020 10:08 AM

## 2020-01-27 LAB — CERVICOVAGINAL ANCILLARY ONLY
Bacterial Vaginitis (gardnerella): POSITIVE — AB
Candida Glabrata: NEGATIVE
Candida Vaginitis: NEGATIVE
Comment: NEGATIVE
Comment: NEGATIVE
Comment: NEGATIVE
Comment: NEGATIVE
Trichomonas: NEGATIVE

## 2020-01-28 ENCOUNTER — Other Ambulatory Visit: Payer: Self-pay | Admitting: Obstetrics

## 2020-01-28 DIAGNOSIS — B9689 Other specified bacterial agents as the cause of diseases classified elsewhere: Secondary | ICD-10-CM

## 2020-01-28 DIAGNOSIS — N760 Acute vaginitis: Secondary | ICD-10-CM

## 2020-01-28 LAB — CYTOLOGY - PAP
Comment: NEGATIVE
Diagnosis: NEGATIVE
High risk HPV: NEGATIVE

## 2020-01-28 MED ORDER — TINIDAZOLE 500 MG PO TABS: 1000.0000 mg | ORAL_TABLET | Freq: Every day | ORAL | 2 refills | Status: DC

## 2020-01-29 ENCOUNTER — Other Ambulatory Visit: Payer: BLUE CROSS/BLUE SHIELD

## 2020-02-07 ENCOUNTER — Ambulatory Visit: Payer: BLUE CROSS/BLUE SHIELD

## 2020-02-10 ENCOUNTER — Ambulatory Visit
Admission: RE | Admit: 2020-02-10 | Discharge: 2020-02-10 | Disposition: A | Discharge status: Home / Self Care | Payer: Medicaid Other | Source: Ambulatory Visit | Attending: Family Medicine | Admitting: Family Medicine

## 2020-02-10 ENCOUNTER — Other Ambulatory Visit: Payer: Self-pay

## 2020-02-10 DIAGNOSIS — Z1231 Encounter for screening mammogram for malignant neoplasm of breast: Secondary | ICD-10-CM | POA: Diagnosis not present

## 2020-02-17 ENCOUNTER — Ambulatory Visit: Payer: BLUE CROSS/BLUE SHIELD

## 2020-03-21 DIAGNOSIS — M25571 Pain in right ankle and joints of right foot: Secondary | ICD-10-CM | POA: Diagnosis not present

## 2020-03-25 DIAGNOSIS — M25571 Pain in right ankle and joints of right foot: Secondary | ICD-10-CM | POA: Diagnosis not present

## 2020-04-03 ENCOUNTER — Ambulatory Visit
Admission: RE | Admit: 2020-04-03 | Discharge: 2020-04-03 | Disposition: A | Discharge status: Home / Self Care | Payer: BLUE CROSS/BLUE SHIELD | Source: Ambulatory Visit | Attending: Obstetrics | Admitting: Obstetrics

## 2020-04-03 ENCOUNTER — Other Ambulatory Visit: Payer: Self-pay

## 2020-04-03 DIAGNOSIS — M8588 Other specified disorders of bone density and structure, other site: Secondary | ICD-10-CM | POA: Diagnosis not present

## 2020-04-03 DIAGNOSIS — E2839 Other primary ovarian failure: Secondary | ICD-10-CM

## 2020-04-03 DIAGNOSIS — Z780 Asymptomatic menopausal state: Secondary | ICD-10-CM | POA: Diagnosis not present

## 2020-05-01 DIAGNOSIS — M25571 Pain in right ankle and joints of right foot: Secondary | ICD-10-CM | POA: Diagnosis not present

## 2020-05-13 DIAGNOSIS — M25571 Pain in right ankle and joints of right foot: Secondary | ICD-10-CM | POA: Diagnosis not present

## 2020-05-20 ENCOUNTER — Other Ambulatory Visit: Payer: Self-pay | Admitting: Orthopaedic Surgery

## 2020-05-28 ENCOUNTER — Encounter (HOSPITAL_BASED_OUTPATIENT_CLINIC_OR_DEPARTMENT_OTHER): Payer: Self-pay | Admitting: Orthopaedic Surgery

## 2020-05-28 ENCOUNTER — Other Ambulatory Visit: Payer: Self-pay

## 2020-05-29 ENCOUNTER — Other Ambulatory Visit (HOSPITAL_COMMUNITY)
Admission: RE | Admit: 2020-05-29 | Discharge: 2020-05-29 | Disposition: A | Discharge status: Home / Self Care | Payer: Medicaid Other | Source: Ambulatory Visit | Attending: Orthopaedic Surgery | Admitting: Orthopaedic Surgery

## 2020-05-29 DIAGNOSIS — Z20822 Contact with and (suspected) exposure to covid-19: Secondary | ICD-10-CM | POA: Insufficient documentation

## 2020-05-29 DIAGNOSIS — Z01812 Encounter for preprocedural laboratory examination: Secondary | ICD-10-CM | POA: Diagnosis not present

## 2020-05-30 LAB — SARS CORONAVIRUS 2 (TAT 6-24 HRS): SARS Coronavirus 2: NEGATIVE

## 2020-06-01 NOTE — Progress Notes (Signed)

## 2020-06-02 ENCOUNTER — Encounter (HOSPITAL_BASED_OUTPATIENT_CLINIC_OR_DEPARTMENT_OTHER): Payer: Self-pay | Admitting: Orthopaedic Surgery

## 2020-06-02 ENCOUNTER — Ambulatory Visit (HOSPITAL_BASED_OUTPATIENT_CLINIC_OR_DEPARTMENT_OTHER): Payer: BLUE CROSS/BLUE SHIELD | Admitting: Anesthesiology

## 2020-06-02 ENCOUNTER — Other Ambulatory Visit: Payer: Self-pay

## 2020-06-02 ENCOUNTER — Encounter (HOSPITAL_BASED_OUTPATIENT_CLINIC_OR_DEPARTMENT_OTHER)
Admission: RE | Disposition: A | Discharge status: Home / Self Care | Payer: Self-pay | Source: Home / Self Care | Attending: Orthopaedic Surgery

## 2020-06-02 ENCOUNTER — Ambulatory Visit (HOSPITAL_BASED_OUTPATIENT_CLINIC_OR_DEPARTMENT_OTHER)
Admission: RE | Admit: 2020-06-02 | Discharge: 2020-06-02 | Disposition: A | Discharge status: Home / Self Care | Payer: BLUE CROSS/BLUE SHIELD | Attending: Orthopaedic Surgery | Admitting: Orthopaedic Surgery

## 2020-06-02 DIAGNOSIS — S86111A Strain of other muscle(s) and tendon(s) of posterior muscle group at lower leg level, right leg, initial encounter: Secondary | ICD-10-CM | POA: Diagnosis not present

## 2020-06-02 DIAGNOSIS — M899 Disorder of bone, unspecified: Secondary | ICD-10-CM | POA: Diagnosis not present

## 2020-06-02 DIAGNOSIS — T8484XA Pain due to internal orthopedic prosthetic devices, implants and grafts, initial encounter: Secondary | ICD-10-CM | POA: Diagnosis not present

## 2020-06-02 DIAGNOSIS — Z853 Personal history of malignant neoplasm of breast: Secondary | ICD-10-CM | POA: Diagnosis not present

## 2020-06-02 DIAGNOSIS — T849XXA Unspecified complication of internal orthopedic prosthetic device, implant and graft, initial encounter: Secondary | ICD-10-CM | POA: Insufficient documentation

## 2020-06-02 DIAGNOSIS — M2141 Flat foot [pes planus] (acquired), right foot: Secondary | ICD-10-CM | POA: Diagnosis not present

## 2020-06-02 DIAGNOSIS — M24574 Contracture, right foot: Secondary | ICD-10-CM | POA: Diagnosis not present

## 2020-06-02 DIAGNOSIS — X58XXXA Exposure to other specified factors, initial encounter: Secondary | ICD-10-CM | POA: Insufficient documentation

## 2020-06-02 DIAGNOSIS — M76821 Posterior tibial tendinitis, right leg: Secondary | ICD-10-CM | POA: Diagnosis not present

## 2020-06-02 DIAGNOSIS — Z87891 Personal history of nicotine dependence: Secondary | ICD-10-CM | POA: Insufficient documentation

## 2020-06-02 DIAGNOSIS — G8918 Other acute postprocedural pain: Secondary | ICD-10-CM | POA: Diagnosis not present

## 2020-06-02 DIAGNOSIS — Z923 Personal history of irradiation: Secondary | ICD-10-CM | POA: Insufficient documentation

## 2020-06-02 DIAGNOSIS — S93691A Other sprain of right foot, initial encounter: Secondary | ICD-10-CM | POA: Diagnosis not present

## 2020-06-02 DIAGNOSIS — M24573 Contracture, unspecified ankle: Secondary | ICD-10-CM | POA: Insufficient documentation

## 2020-06-02 DIAGNOSIS — M21071 Valgus deformity, not elsewhere classified, right ankle: Secondary | ICD-10-CM | POA: Insufficient documentation

## 2020-06-02 DIAGNOSIS — T8489XA Other specified complication of internal orthopedic prosthetic devices, implants and grafts, initial encounter: Secondary | ICD-10-CM | POA: Diagnosis not present

## 2020-06-02 DIAGNOSIS — M216X1 Other acquired deformities of right foot: Secondary | ICD-10-CM | POA: Diagnosis not present

## 2020-06-02 DIAGNOSIS — S93491A Sprain of other ligament of right ankle, initial encounter: Secondary | ICD-10-CM | POA: Diagnosis not present

## 2020-06-02 HISTORY — PX: CALCANEAL OSTEOTOMY: SHX1281

## 2020-06-02 HISTORY — PX: ACHILLES TENDON SURGERY: SHX542

## 2020-06-02 HISTORY — DX: Pain in right foot: M79.671

## 2020-06-02 HISTORY — PX: METATARSAL OSTEOTOMY: SHX1641

## 2020-06-02 HISTORY — PX: HARDWARE REMOVAL: SHX979

## 2020-06-02 SURGERY — OSTEOTOMY, CALCANEUS
Anesthesia: Regional | Site: Foot | Laterality: Right

## 2020-06-02 MED ORDER — PROPOFOL 10 MG/ML IV BOLUS
INTRAVENOUS | Status: DC | PRN
Start: 2020-06-02 — End: 2020-06-02
  Administered 2020-06-02: 40 mg via INTRAVENOUS
  Administered 2020-06-02: 160 mg via INTRAVENOUS

## 2020-06-02 MED ORDER — GLYCOPYRROLATE 0.2 MG/ML IJ SOLN
INTRAMUSCULAR | Status: AC
  Filled 2020-06-02: qty 1

## 2020-06-02 MED ORDER — ACETAMINOPHEN 10 MG/ML IV SOLN
INTRAVENOUS | Status: AC
  Filled 2020-06-02: qty 100

## 2020-06-02 MED ORDER — CHLORHEXIDINE GLUCONATE 4 % EX LIQD: Freq: Once | CUTANEOUS | Status: DC

## 2020-06-02 MED ORDER — CELECOXIB 200 MG PO CAPS: 200.0000 mg | ORAL_CAPSULE | Freq: Once | ORAL | Status: DC

## 2020-06-02 MED ORDER — DEXMEDETOMIDINE (PRECEDEX) IN NS 20 MCG/5ML (4 MCG/ML) IV SYRINGE
PREFILLED_SYRINGE | INTRAVENOUS | Status: AC
  Filled 2020-06-02: qty 5

## 2020-06-02 MED ORDER — OXYCODONE HCL 5 MG PO TABS
ORAL_TABLET | ORAL | Status: AC
  Filled 2020-06-02: qty 1

## 2020-06-02 MED ORDER — PROMETHAZINE HCL 25 MG/ML IJ SOLN: 6.2500 mg | INTRAMUSCULAR | Status: DC | PRN

## 2020-06-02 MED ORDER — OXYCODONE HCL 5 MG PO TABS
5.0000 mg | ORAL_TABLET | Freq: Once | ORAL | Status: AC
  Administered 2020-06-02: 5 mg via ORAL

## 2020-06-02 MED ORDER — LACTATED RINGERS IV SOLN: INTRAVENOUS | Status: DC

## 2020-06-02 MED ORDER — ARTIFICIAL TEARS OPHTHALMIC OINT
TOPICAL_OINTMENT | OPHTHALMIC | Status: DC | PRN
Start: 2020-06-02 — End: 2020-06-02
  Administered 2020-06-02: 1 via OPHTHALMIC

## 2020-06-02 MED ORDER — GLYCOPYRROLATE 0.2 MG/ML IJ SOLN
INTRAMUSCULAR | Status: DC | PRN
Start: 2020-06-02 — End: 2020-06-02
  Administered 2020-06-02: .2 mg via INTRAVENOUS

## 2020-06-02 MED ORDER — ACETAMINOPHEN 500 MG PO TABS: 1000.0000 mg | ORAL_TABLET | Freq: Once | ORAL | Status: DC

## 2020-06-02 MED ORDER — MIDAZOLAM HCL 2 MG/2ML IJ SOLN
INTRAMUSCULAR | Status: AC
  Filled 2020-06-02: qty 2

## 2020-06-02 MED ORDER — EPHEDRINE 5 MG/ML INJ
INTRAVENOUS | Status: AC
  Filled 2020-06-02: qty 10

## 2020-06-02 MED ORDER — BUPIVACAINE HCL (PF) 0.5 % IJ SOLN
INTRAMUSCULAR | Status: DC | PRN
Start: 2020-06-02 — End: 2020-06-02
  Administered 2020-06-02: 8 mL
  Administered 2020-06-02: 22 mL

## 2020-06-02 MED ORDER — EPHEDRINE SULFATE 50 MG/ML IJ SOLN
INTRAMUSCULAR | Status: DC | PRN
Start: 2020-06-02 — End: 2020-06-02
  Administered 2020-06-02 (×3): 10 mg via INTRAVENOUS

## 2020-06-02 MED ORDER — KETOROLAC TROMETHAMINE 30 MG/ML IJ SOLN
INTRAMUSCULAR | Status: AC
  Filled 2020-06-02: qty 1

## 2020-06-02 MED ORDER — DEXAMETHASONE SODIUM PHOSPHATE 10 MG/ML IJ SOLN
INTRAMUSCULAR | Status: DC | PRN
  Administered 2020-06-02: 5 mg via INTRAVENOUS

## 2020-06-02 MED ORDER — MIDAZOLAM HCL 2 MG/2ML IJ SOLN
INTRAMUSCULAR | Status: DC | PRN
Start: 2020-06-02 — End: 2020-06-02
  Administered 2020-06-02 (×2): 1 mg via INTRAVENOUS

## 2020-06-02 MED ORDER — ACETAMINOPHEN 10 MG/ML IV SOLN
1000.0000 mg | Freq: Once | INTRAVENOUS | Status: AC
  Administered 2020-06-02: 1000 mg via INTRAVENOUS

## 2020-06-02 MED ORDER — FENTANYL CITRATE (PF) 100 MCG/2ML IJ SOLN
25.0000 ug | INTRAMUSCULAR | Status: DC | PRN
  Administered 2020-06-02: 50 ug via INTRAVENOUS

## 2020-06-02 MED ORDER — DEXMEDETOMIDINE HCL 200 MCG/2ML IV SOLN
INTRAVENOUS | Status: DC | PRN
  Administered 2020-06-02 (×4): 4 ug via INTRAVENOUS
  Administered 2020-06-02: 8 ug via INTRAVENOUS
  Administered 2020-06-02 (×2): 4 ug via INTRAVENOUS
  Administered 2020-06-02: 8 ug via INTRAVENOUS
  Administered 2020-06-02: 4 ug via INTRAVENOUS
  Administered 2020-06-02: 8 ug via INTRAVENOUS
  Administered 2020-06-02 (×2): 4 ug via INTRAVENOUS

## 2020-06-02 MED ORDER — ROCURONIUM BROMIDE 10 MG/ML (PF) SYRINGE
PREFILLED_SYRINGE | INTRAVENOUS | Status: AC
  Filled 2020-06-02: qty 10

## 2020-06-02 MED ORDER — ONDANSETRON HCL 4 MG/2ML IJ SOLN
INTRAMUSCULAR | Status: DC | PRN
  Administered 2020-06-02: 4 mg via INTRAVENOUS

## 2020-06-02 MED ORDER — MIDAZOLAM HCL 2 MG/2ML IJ SOLN
2.0000 mg | Freq: Once | INTRAMUSCULAR | Status: AC
  Administered 2020-06-02: 2 mg via INTRAVENOUS

## 2020-06-02 MED ORDER — FENTANYL CITRATE (PF) 100 MCG/2ML IJ SOLN
INTRAMUSCULAR | Status: AC
  Filled 2020-06-02: qty 2

## 2020-06-02 MED ORDER — PROPOFOL 10 MG/ML IV BOLUS
INTRAVENOUS | Status: AC
  Filled 2020-06-02: qty 20

## 2020-06-02 MED ORDER — KETOROLAC TROMETHAMINE 30 MG/ML IJ SOLN
30.0000 mg | Freq: Once | INTRAMUSCULAR | Status: AC
  Administered 2020-06-02: 30 mg via INTRAVENOUS

## 2020-06-02 MED ORDER — OXYCODONE HCL 5 MG PO TABS: 5.0000 mg | ORAL_TABLET | ORAL | 0 refills | Status: AC | PRN

## 2020-06-02 MED ORDER — ARTIFICIAL TEARS OPHTHALMIC OINT
TOPICAL_OINTMENT | OPHTHALMIC | Status: AC
  Filled 2020-06-02: qty 3.5

## 2020-06-02 MED ORDER — CEFAZOLIN SODIUM-DEXTROSE 2-4 GM/100ML-% IV SOLN
INTRAVENOUS | Status: AC
  Filled 2020-06-02: qty 100

## 2020-06-02 MED ORDER — BUPIVACAINE LIPOSOME 1.3 % IJ SUSP
INTRAMUSCULAR | Status: DC | PRN
Start: 2020-06-02 — End: 2020-06-02
  Administered 2020-06-02: 8 mL
  Administered 2020-06-02: 2 mL

## 2020-06-02 MED ORDER — CEFAZOLIN SODIUM-DEXTROSE 2-4 GM/100ML-% IV SOLN
2.0000 g | INTRAVENOUS | Status: AC
  Administered 2020-06-02: 2 g via INTRAVENOUS

## 2020-06-02 MED ORDER — FENTANYL CITRATE (PF) 100 MCG/2ML IJ SOLN
INTRAMUSCULAR | Status: DC | PRN
  Administered 2020-06-02: 50 ug via INTRAVENOUS
  Administered 2020-06-02 (×2): 25 ug via INTRAVENOUS

## 2020-06-02 MED ORDER — ONDANSETRON HCL 4 MG/2ML IJ SOLN
INTRAMUSCULAR | Status: AC
  Filled 2020-06-02: qty 2

## 2020-06-02 MED ORDER — FENTANYL CITRATE (PF) 100 MCG/2ML IJ SOLN
100.0000 ug | Freq: Once | INTRAMUSCULAR | Status: AC
  Administered 2020-06-02: 100 ug via INTRAVENOUS

## 2020-06-02 SURGICAL SUPPLY — 85 items
ANCH SUT SWLK 19.1X4.75 (Anchor) ×2 IMPLANT
ANCHOR SUT BIO SW 4.75X19.1 (Anchor) ×2 IMPLANT
APL PRP STRL LF DISP 70% ISPRP (MISCELLANEOUS) ×2
APL SKNCLS STERI-STRIP NONHPOA (GAUZE/BANDAGES/DRESSINGS)
BANDAGE ESMARK 6X9 LF (GAUZE/BANDAGES/DRESSINGS) IMPLANT
BENZOIN TINCTURE PRP APPL 2/3 (GAUZE/BANDAGES/DRESSINGS) IMPLANT
BIT DRILL 3.2 CANN LG (DRILL) ×2 IMPLANT
BLADE AVERAGE 25X9 (BLADE) IMPLANT
BLADE MICRO SAGITTAL (BLADE) ×2 IMPLANT
BLADE SURG 11 STRL SS (BLADE) ×2 IMPLANT
BLADE SURG 15 STRL LF DISP TIS (BLADE) ×12 IMPLANT
BLADE SURG 15 STRL SS (BLADE) ×27
BNDG CMPR 9X6 STRL LF SNTH (GAUZE/BANDAGES/DRESSINGS)
BNDG COHESIVE 4X5 TAN STRL (GAUZE/BANDAGES/DRESSINGS) IMPLANT
BNDG ELASTIC 6X5.8 VLCR STR LF (GAUZE/BANDAGES/DRESSINGS) ×6 IMPLANT
BNDG ESMARK 6X9 LF (GAUZE/BANDAGES/DRESSINGS)
CHLORAPREP W/TINT 26 (MISCELLANEOUS) ×3 IMPLANT
COVER BACK TABLE 60X90IN (DRAPES) ×3 IMPLANT
COVER MAYO STAND STRL (DRAPES) ×2 IMPLANT
COVER WAND RF STERILE (DRAPES) IMPLANT
CUFF TOURN SGL QUICK 34 (TOURNIQUET CUFF) ×3
CUFF TRNQT CYL 34X4.125X (TOURNIQUET CUFF) ×2 IMPLANT
DECANTER SPIKE VIAL GLASS SM (MISCELLANEOUS) IMPLANT
DRAPE C-ARM 42X72 X-RAY (DRAPES) ×1 IMPLANT
DRAPE C-ARMOR (DRAPES) ×1 IMPLANT
DRAPE EXTREMITY T 121X128X90 (DISPOSABLE) ×3 IMPLANT
DRAPE IMP U-DRAPE 54X76 (DRAPES) ×3 IMPLANT
DRAPE OEC MINIVIEW 54X84 (DRAPES) ×2 IMPLANT
DRAPE U-SHAPE 47X51 STRL (DRAPES) ×3 IMPLANT
DRSG PAD ABDOMINAL 8X10 ST (GAUZE/BANDAGES/DRESSINGS) ×2 IMPLANT
ELECT REM PT RETURN 9FT ADLT (ELECTROSURGICAL) ×3
ELECTRODE REM PT RTRN 9FT ADLT (ELECTROSURGICAL) ×2 IMPLANT
GAUZE SPONGE 4X4 12PLY STRL (GAUZE/BANDAGES/DRESSINGS) ×3 IMPLANT
GAUZE XEROFORM 1X8 LF (GAUZE/BANDAGES/DRESSINGS) ×3 IMPLANT
GLOVE BIOGEL M 6.5 STRL (GLOVE) ×2 IMPLANT
GLOVE BIOGEL M STRL SZ7.5 (GLOVE) ×3 IMPLANT
GLOVE BIOGEL PI IND STRL 6.5 (GLOVE) ×1 IMPLANT
GLOVE BIOGEL PI IND STRL 8 (GLOVE) ×2 IMPLANT
GLOVE BIOGEL PI INDICATOR 6.5 (GLOVE) ×1
GLOVE BIOGEL PI INDICATOR 8 (GLOVE) ×1
GOWN STRL REUS W/ TWL LRG LVL3 (GOWN DISPOSABLE) ×2 IMPLANT
GOWN STRL REUS W/ TWL XL LVL3 (GOWN DISPOSABLE) ×2 IMPLANT
GOWN STRL REUS W/TWL LRG LVL3 (GOWN DISPOSABLE) ×3
GOWN STRL REUS W/TWL XL LVL3 (GOWN DISPOSABLE) ×3
GUIDEWIRE TROC TIP .062X9.25 (WIRE) ×4 IMPLANT
KIT BIO-TENODESIS 3X8 DISP (MISCELLANEOUS) ×3
KIT BIOCARTILAGE LG JOINT MIX (KITS) IMPLANT
KIT INSRT BABSR STRL DISP BTN (MISCELLANEOUS) ×1 IMPLANT
MANIFOLD NEPTUNE II (INSTRUMENTS) ×2 IMPLANT
NDL SUT 6 .5 CRC .975X.05 MAYO (NEEDLE) ×1 IMPLANT
NEEDLE MAYO TAPER (NEEDLE) ×3
NS IRRIG 1000ML POUR BTL (IV SOLUTION) ×3 IMPLANT
PACK BASIN DAY SURGERY FS (CUSTOM PROCEDURE TRAY) ×3 IMPLANT
PAD CAST 4YDX4 CTTN HI CHSV (CAST SUPPLIES) ×2 IMPLANT
PADDING CAST COTTON 4X4 STRL (CAST SUPPLIES) ×3
PADDING CAST SYNTHETIC 4 (CAST SUPPLIES) ×3
PADDING CAST SYNTHETIC 4X4 STR (CAST SUPPLIES) ×4 IMPLANT
PENCIL SMOKE EVACUATOR (MISCELLANEOUS) ×5 IMPLANT
SCREW LOW COMP HEADLESS 5.0X46 (Screw) ×2 IMPLANT
SCREW LOW COMP HEADLESS 5.0X55 (Screw) ×4 IMPLANT
SHEET MEDIUM DRAPE 40X70 STRL (DRAPES) ×3 IMPLANT
SLEEVE SCD COMPRESS KNEE MED (MISCELLANEOUS) ×3 IMPLANT
SPLINT FAST PLASTER 5X30 (CAST SUPPLIES) ×20
SPLINT PLASTER CAST FAST 5X30 (CAST SUPPLIES) ×40 IMPLANT
SPONGE LAP 18X18 RF (DISPOSABLE) ×5 IMPLANT
STOCKINETTE 6  STRL (DRAPES) ×3
STOCKINETTE 6 STRL (DRAPES) ×2 IMPLANT
STRIP CLOSURE SKIN 1/2X4 (GAUZE/BANDAGES/DRESSINGS) IMPLANT
SUCTION FRAZIER HANDLE 10FR (MISCELLANEOUS) ×3
SUCTION TUBE FRAZIER 10FR DISP (MISCELLANEOUS) ×2 IMPLANT
SUT ETHILON 3 0 PS 1 (SUTURE) ×5 IMPLANT
SUT FIBERWIRE 2-0 18 17.9 3/8 (SUTURE) ×3
SUT MNCRL AB 3-0 PS2 18 (SUTURE) ×5 IMPLANT
SUT PDS AB 2-0 CT2 27 (SUTURE) ×3 IMPLANT
SUT VIC AB 2-0 SH 27 (SUTURE) ×3
SUT VIC AB 2-0 SH 27XBRD (SUTURE) ×1 IMPLANT
SUT VIC AB 3-0 FS2 27 (SUTURE) IMPLANT
SUTURE FIBERWR 2-0 18 17.9 3/8 (SUTURE) ×1 IMPLANT
SUTURE TAPE 1.3 FIBERLOP 20 ST (SUTURE) ×1 IMPLANT
SUTURETAPE 1.3 FIBERLOOP 20 ST (SUTURE) ×3
SYR 5ML LL (SYRINGE) ×1 IMPLANT
SYR BULB EAR ULCER 3OZ GRN STR (SYRINGE) ×3 IMPLANT
TOWEL GREEN STERILE FF (TOWEL DISPOSABLE) ×6 IMPLANT
TUBE CONNECTING 20X1/4 (TUBING) ×3 IMPLANT
UNDERPAD 30X36 HEAVY ABSORB (UNDERPADS AND DIAPERS) ×3 IMPLANT

## 2020-06-02 NOTE — Brief Op Note (Signed)
06/02/2020  3:01 PM  PATIENT:  [name]  54 y.o. female  PRE-OPERATIVE DIAGNOSIS:  RIGHT PES PLANOVALGUS, EQUINUS CONTRACTURE, POSTERIOR TIBIAL TENDON TEAR, SYMPTOMATIC ORTHOPEDIC HARDWARE  POST-OPERATIVE DIAGNOSIS:  RIGHT PES PLANOVALGUS, EQUINUS CONTRACTURE, POSTERIOR TIBIAL TENDON TEAR, SYMPTOMATIC ORTHOPEDIC HARDWARE  PROCEDURE:   Right medial displacement calcaneal osteotomy Right tendo Achilles lengthening Deep hardware removal from medial ankle Right posterior tibial tendon debridement and reconstruction Right calcaneonavicular ligament reconstruction Right flexor digitorum longus tendon transfer   SURGEON:  Surgeon(s) and Role:    Erle Crocker, MD - Primary  PHYSICIAN ASSISTANT:   ASSISTANTS: none   ANESTHESIA:   general  EBL:  10 mL   BLOOD ADMINISTERED:none  DRAINS: none   LOCAL MEDICATIONS USED:  NONE  SPECIMEN:  No Specimen  DISPOSITION OF SPECIMEN:  N/A  COUNTS:  YES  TOURNIQUET:   Total Tourniquet Time Documented: Thigh (Right) - 91 minutes Total: Thigh (Right) - 91 minutes   DICTATION: .Viviann Spare Dictation  PLAN OF CARE: Discharge to home after PACU  PATIENT DISPOSITION:  PACU - hemodynamically stable.   Delay start of Pharmacological VTE agent (>24hrs) due to surgical blood loss or risk of bleeding: no

## 2020-06-02 NOTE — Discharge Instructions (Signed)
DR. Lucia Gaskins FOOT & ANKLE SURGERY POST-OP INSTRUCTIONS   Pain Management 1. The numbing medicine and your leg will last around 18 hours, take a dose of your pain medicine as soon as you feel it wearing off to avoid rebound pain. 2. Keep your foot elevated above heart level.  Make sure that your heel hangs free ('floats'). 3. Take all prescribed medication as directed. 4. If taking narcotic pain medication you may want to use an over-the-counter stool softener to avoid constipation. 5. You may take over-the-counter NSAIDs (ibuprofen, naproxen, etc.) as well as over-the-counter acetaminophen as directed on the packaging as a supplement for your pain and may also use it to wean away from the prescription medication.  Activity ? Non-weightbearing ? Keep splint intact  First Postoperative Visit 1. Your first postop visit will be at least 2 weeks after surgery.  This should be scheduled when you schedule surgery. 2. If you do not have a postoperative visit scheduled please call [phone] to schedule an appointment. 3. At the appointment your incision will be evaluated for suture removal, x-rays will be obtained if necessary.  General Instructions 1. Swelling is very common after foot and ankle surgery.  It often takes 3 months for the foot and ankle to begin to feel comfortable.  Some amount of swelling will persist for 6-12 months. 2. DO NOT change the dressing.  If there is a problem with the dressing (too tight, loose, gets wet, etc.) please contact Dr. Pollie Friar office. 3. DO NOT get the dressing wet.  For showers you can use an over-the-counter cast cover or wrap a washcloth around the top of your dressing and then cover it with a plastic bag and tape it to your leg. 4. DO NOT soak the incision (no tubs, pools, bath, etc.) until you have approval from Dr. Lucia Gaskins.  Contact Dr. Huel Cote office or go to Emergency Room if: 1. Temperature above 101 F. 2. Increasing pain that is unresponsive to pain  medication or elevation 3. Excessive redness or swelling in your foot 4. Dressing problems - excessive bloody drainage, looseness or tightness, or if dressing gets wet 5. Develop pain, swelling, warmth, or discoloration of your calf   TYLENOL GIVEN AT 2:20 PM . NO TYLENOL PRODUCTS UNTIL 8: 30 PM  NO IBUPROFEN UNTIL 11:00 PM  OXYCODONE 5 MG GIVEN AT 3:20 PM   Post Anesthesia Home Care Instructions  Activity: Get plenty of rest for the remainder of the day. A responsible individual must stay with you for 24 hours following the procedure.  For the next 24 hours, DO NOT: -Drive a car -Paediatric nurse -Drink alcoholic beverages -Take any medication unless instructed by your physician -Make any legal decisions or sign important papers.  Meals: Start with liquid foods such as gelatin or soup. Progress to regular foods as tolerated. Avoid greasy, spicy, heavy foods. If nausea and/or vomiting occur, drink only clear liquids until the nausea and/or vomiting subsides. Call your physician if vomiting continues.  Special Instructions/Symptoms: Your throat may feel dry or sore from the anesthesia or the breathing tube placed in your throat during surgery. If this causes discomfort, gargle with warm salt water. The discomfort should disappear within 24 hours.  Regional Anesthesia Blocks  1. Numbness or the inability to move the "blocked" extremity may last from 3-48 hours after placement. The length of time depends on the medication injected and your individual response to the medication. If the numbness is not going away after 48 hours, call  your surgeon.  2. The extremity that is blocked will need to be protected until the numbness is gone and the  Strength has returned. Because you cannot feel it, you will need to take extra care to avoid injury. Because it may be weak, you may have difficulty moving it or using it. You may not know what position it is in without looking at it while the block  is in effect.  3. For blocks in the legs and feet, returning to weight bearing and walking needs to be done carefully. You will need to wait until the numbness is entirely gone and the strength has returned. You should be able to move your leg and foot normally before you try and bear weight or walk. You will need someone to be with you when you first try to ensure you do not fall and possibly risk injury.  4. Bruising and tenderness at the needle site are common side effects and will resolve in a few days.  5. Persistent numbness or new problems with movement should be communicated to the surgeon or the [place] [phone] [place] [phone]).    EXPAREL (bupivacaine liposome injectable suspension)   Your surgeon or anesthesiologist gave you EXPAREL(bupivacaine) to help control your pain after surgery.   EXPAREL is a local anesthetic that provides pain relief by numbing the tissue around the surgical site.  EXPAREL is designed to release pain medication over time and can control pain for up to 72 hours.  Depending on how you respond to EXPAREL, you may require less pain medication during your recovery.  Possible side effects:  Temporary loss of sensation or ability to move in the area where bupivacaine was injected.  Nausea, vomiting, constipation  Rarely, numbness and tingling in your mouth or lips, lightheadedness, or anxiety may occur.  Call your doctor right away if you think you may be experiencing any of these sensations, or if you have other questions regarding possible side effects.  Follow all other discharge instructions given to you by your surgeon or nurse. Eat a healthy diet and drink plenty of water or other fluids.  If you return to the hospital for any reason within 96 hours following the administration of EXPAREL, it is important for health care providers to know that you have received this anesthetic. A teal colored band has  been placed on your arm with the date, time and amount of EXPAREL you have received in order to alert and inform your health care providers. Please leave this armband in place for the full 96 hours following administration, and then you may remove the band.

## 2020-06-02 NOTE — Progress Notes (Signed)
Assisted Dr. Singer with right, ultrasound guided, popliteal, adductor canal block. Side rails up, monitors on throughout procedure. See vital signs in flow sheet. Tolerated Procedure well. °

## 2020-06-02 NOTE — Anesthesia Procedure Notes (Signed)
Anesthesia Regional Block: Popliteal block   Pre-Anesthetic Checklist: ,, timeout performed, Correct Patient, Correct Site, Correct Laterality, Correct Procedure, Correct Position, site marked, Risks and benefits discussed,  Surgical consent,  Pre-op evaluation,  At surgeon's request and post-op pain management  Laterality: Right  Prep: chloraprep       Needles:  Injection technique: Single-shot  Needle Type: Echogenic Stimulator Needle          Additional Needles:   Narrative:  Start time: 06/02/2020 10:44 AM End time: 06/02/2020 10:54 AM Injection made incrementally with aspirations every 5 mL.  Performed by: Personally  Anesthesiologist: Duane Boston, MD  Additional Notes: A functioning IV was confirmed and monitors were applied.  Sterile prep and drape, hand hygiene and sterile gloves were used.  Negative aspiration and test dose prior to incremental administration of local anesthetic. The patient tolerated the procedure well.Ultrasound  guidance: relevant anatomy identified, needle position confirmed, local anesthetic spread visualized around nerve(s), vascular puncture avoided.  Image printed for medical record. ADC supplementation.

## 2020-06-02 NOTE — Anesthesia Procedure Notes (Signed)
Procedure Name: LMA Insertion Date/Time: 06/02/2020 11:58 AM Performed by: Collier Bullock, CRNA Pre-anesthesia Checklist: Patient identified, Emergency Drugs available, Suction available and Patient being monitored Patient Re-evaluated:Patient Re-evaluated prior to induction Oxygen Delivery Method: Circle system utilized Preoxygenation: Pre-oxygenation with 100% oxygen Induction Type: IV induction Ventilation: Mask ventilation without difficulty and Nasal airway inserted- appropriate to patient size Tube size: 4.0 mm Number of attempts: 1 Placement Confirmation: positive ETCO2 and breath sounds checked- equal and bilateral Tube secured with: Tape Dental Injury: Teeth and Oropharynx as per pre-operative assessment

## 2020-06-02 NOTE — Anesthesia Postprocedure Evaluation (Signed)
Anesthesia Post Note  Patient: Sue [name]  Procedure(s) Performed: RIGHT LATERAL DISPLACEMENT CALCANEAL OSTEOTOMY, FLEXOR DIGITORUM LONGUS TRANSFER, SPRING LIGAMENT RECONSTRUCTION, POSTERIOR TIBIAL TENDON DEBRIDEMENT, DEEP ORTHOPEDIC HARDWARE REMOVAL, MEDIAL CUNEIFORM PLANTAR FLEXION OSTEOTOMY AND ACHILLES LENGTHENING, PARTIAL RESECTION OF NAVICULAR (Right Foot) FLEXOR DIGITORUM LONGUS TENDON TO NAVICULAR TRANSFER (Right Foot) HARDWARE REMOVAL (Right Foot) ACHILLES LENGTHENING/KIDNER (Right Foot) METATARSAL OSTEOTOMY (Right Foot)     Patient location during evaluation: PACU Anesthesia Type: Regional and MAC Level of consciousness: awake and alert Pain management: pain level controlled Vital Signs Assessment: post-procedure vital signs reviewed and stable Respiratory status: spontaneous breathing, nonlabored ventilation and respiratory function stable Cardiovascular status: blood pressure returned to baseline and stable Postop Assessment: no apparent nausea or vomiting Anesthetic complications: no   No complications documented.  Last Vitals:  Vitals:   06/02/20 1500 06/02/20 1526  BP: 113/76 137/86  Pulse: (!) 57 (!) 55  Resp: 16 16  Temp:  36.4 C  SpO2: 100% 100%    Last Pain:  Vitals:   06/02/20 1526  TempSrc: Oral  PainSc: [place]

## 2020-06-02 NOTE — H&P (Signed)
PREOPERATIVE H&P  Chief Complaint: Right foot pain  HPI: Sue [name] is a 54 y.o. female who presents for preoperative history and physical with a diagnosis of right posterior tibial tendon insufficiency. Symptoms are rated as moderate to severe, and have been worsening.  This is significantly impairing activities of daily living.  She has elected for surgical management.   Past Medical History:  Diagnosis Date  . Breast cancer ([place]) 08/28/12   Left Breast  . Right foot pain   . S/P radiation therapy 12/06/12 -01/23/13   Left Breast/Axilla / 46 Gy / 23 Fractions with a Boost to Left Breast / 14 Gy / 7 Fractions  . Use of tamoxifen (Nolvadex) march 2014  . Wears glasses    Past Surgical History:  Procedure Laterality Date  . BREAST LUMPECTOMY Left    takes tamoxifen  . DILATION AND CURETTAGE OF UTERUS     Following Miscarriage  . FOOT FUSION  3/09   ankle rt  . Left Breast Lumpectomy  08/28/12  . 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 Microcalcifications Identified  . RE-EXCISION OF BREAST CANCER,SUPERIOR MARGINS  10/30/2012   Procedure: RE-EXCISION OF BREAST CANCER,SUPERIOR MARGINS;  Surgeon: Adin Hector, MD;  Location: [facility];  Service: General;  Laterality: N/A;  left partial mastectomy with excision of margins  . re-excision of left breast cancer on 09/10/12     Social History   Socioeconomic History  . Marital status: Married    Spouse name: Not on file  . Number of children: 1  . Years of education: Not on file  . Highest education level: Bachelor's degree (e.g., BA, AB, BS)  Occupational History  . Not on file  Tobacco Use  . Smoking status: Former Smoker    Types: Cigarettes    Quit date: 11/30/1993    Years since quitting: 26.5  . Smokeless tobacco: Never Used  Vaping Use  . Vaping Use: Never used  Substance and Sexual Activity  . Alcohol use: Yes    Comment: very seldom, 1x per week  . Drug use: No   . Sexual activity: Yes    Birth control/protection: Post-menopausal  Other Topics Concern  . Not on file  Social History Narrative  . Not on file   Social Determinants of Health   Financial Resource Strain:   . Difficulty of Paying Living Expenses:   Food Insecurity:   . Worried About Charity fundraiser in the Last Year:   . Arboriculturist in the Last Year:   Transportation Needs:   . Film/video editor (Medical):   Marland Kitchen Lack of Transportation (Non-Medical):   Physical Activity:   . Days of Exercise per Week:   . Minutes of Exercise per Session:   Stress:   . Feeling of Stress :   Social Connections:   . Frequency of Communication with Friends and Family:   . Frequency of Social Gatherings with Friends and Family:   . Attends Religious Services:   . Active Member of Clubs or Organizations:   . Attends Archivist Meetings:   Marland Kitchen Marital Status:    Family History  Problem Relation Age of Onset  . Breast cancer Mother 60       blood cancer too  . Breast cancer Cousin        paternal cousin diagnosed; diagnosed in her late 41s  . Brain cancer Paternal Aunt  diagnosed in late 49s to early 50s   No Known Allergies Prior to Admission medications   Not on File     Positive ROS: All other systems have been reviewed and were otherwise negative with the exception of those mentioned in the HPI and as above.  Physical Exam: @vs  General: Alert, no acute distress Cardiovascular: No pedal edema Respiratory: No cyanosis, no use of accessory musculature GI: No organomegaly, abdomen is soft and non-tender Skin: No lesions in the area of chief complaint Neurologic: Sensation intact distally Psychiatric: Patient is competent for consent with normal mood and affect Lymphatic: No axillary or cervical lymphadenopathy  MUSCULOSKELETAL: Right foot with pes planovalgus.  Tenderness along the posterior tibial tendon.  Weakness with testing of the posterior tibial  tendon.  Some flexibility of the hindfoot.  Sensation grossly intact distally.  She has fixed forefoot supination and an equinus contracture.  Assessment: Posterior tibial tendon insufficiency with pes planovalgus and fixed forefoot supination   Plan: Plan for we will plan for medial displacement calcaneal osteotomy and debridement of her posterior tibial tendon with transfer of flexor digitorum longus.  We will perform medial column plantarflexion osteotomy.  She will require hardware removal from her medial malleolus.  We will also perform a tendo Achilles lengthening given her equinus contracture.  Patient did fail conservative treatment form of boot immobilization, anti-inflammatories and physical therapy and is indicated for surgery..  The risks benefits and alternatives were discussed with the patient including but not limited to the risks of nonoperative treatment, versus surgical intervention including infection, bleeding, nerve injury,  blood clots, cardiopulmonary complications, morbidity, mortality, among others, and they were willing to proceed.      Erle Crocker, MD    06/02/2020 11:16 AM

## 2020-06-02 NOTE — Anesthesia Preprocedure Evaluation (Signed)
Anesthesia Evaluation  Patient identified by MRN, date of birth, ID band Patient awake    Reviewed: Allergy & Precautions, NPO status , Patient's Chart, lab work & pertinent test results  Airway Mallampati: II  TM Distance: >3 FB Neck ROM: Full    Dental no notable dental hx. (+) Dental Advisory Given   Pulmonary former smoker,    Pulmonary exam normal        Cardiovascular negative cardio ROS Normal cardiovascular exam     Neuro/Psych negative neurological ROS  negative psych ROS   GI/Hepatic negative GI ROS, Neg liver ROS,   Endo/Other  negative endocrine ROS  Renal/GU negative Renal ROS  negative genitourinary   Musculoskeletal negative musculoskeletal ROS (+)   Abdominal   Peds negative pediatric ROS (+)  Hematology negative hematology ROS (+)   Anesthesia Other Findings   Reproductive/Obstetrics negative OB ROS                             Anesthesia Physical Anesthesia Plan  ASA: II  Anesthesia Plan: General   Post-op Pain Management:  Regional for Post-op pain   Induction:   PONV Risk Score and Plan: 3 and Ondansetron, Dexamethasone and Midazolam  Airway Management Planned: LMA  Additional Equipment:   Intra-op Plan:   Post-operative Plan: Extubation in OR  Informed Consent: I have reviewed the patients History and Physical, chart, labs and discussed the procedure including the risks, benefits and alternatives for the proposed anesthesia with the patient or authorized representative who has indicated his/her understanding and acceptance.     Dental advisory given  Plan Discussed with: Anesthesiologist and CRNA  Anesthesia Plan Comments:         Anesthesia Quick Evaluation

## 2020-06-02 NOTE — Transfer of Care (Signed)
Immediate Anesthesia Transfer of Care Note  Patient: Sue [name]  Procedure(s) Performed: RIGHT LATERAL DISPLACEMENT CALCANEAL OSTEOTOMY, FLEXOR DIGITORUM LONGUS TRANSFER, SPRING LIGAMENT RECONSTRUCTION, POSTERIOR TIBIAL TENDON DEBRIDEMENT, DEEP ORTHOPEDIC HARDWARE REMOVAL, MEDIAL CUNEIFORM PLANTAR FLEXION OSTEOTOMY AND ACHILLES LENGTHENING, PARTIAL RESECTION OF NAVICULAR (Right Foot) FLEXOR DIGITORUM LONGUS TENDON TO NAVICULAR TRANSFER (Right Foot) HARDWARE REMOVAL (Right Foot) ACHILLES LENGTHENING/KIDNER (Right Foot) METATARSAL OSTEOTOMY (Right Foot)  Patient Location: PACU  Anesthesia Type:General and Regional  Level of Consciousness: awake and drowsy  Airway & Oxygen Therapy: Patient Spontanous Breathing and Patient connected to face mask oxygen  Post-op Assessment: Report given to RN, Post -op Vital signs reviewed and stable, Patient moving all extremities X 4 and Patient able to stick tongue midline  Post vital signs: Reviewed and stable  Last Vitals:  Vitals Value Taken Time  BP 114/85 06/02/20 1406  Temp    Pulse 63 06/02/20 1409  Resp 20 06/02/20 1409  SpO2 100 % 06/02/20 1409  Vitals shown include unvalidated device data.  Last Pain:  Vitals:   06/02/20 1056  TempSrc:   PainSc: 0-No pain         Complications: No complications documented.

## 2020-06-04 ENCOUNTER — Encounter (HOSPITAL_BASED_OUTPATIENT_CLINIC_OR_DEPARTMENT_OTHER): Payer: Self-pay | Admitting: Orthopaedic Surgery

## 2020-06-17 DIAGNOSIS — M25571 Pain in right ankle and joints of right foot: Secondary | ICD-10-CM | POA: Diagnosis not present

## 2020-07-01 NOTE — Op Note (Signed)
Sue [name] female 54 y.o. 07/01/2020  PreOperative Diagnosis: Right foot pes planovalgus Right hindfoot valgus Posterior tibial tendon insufficiency Retained symptomatic deep orthopedic hardware right ankle Equinus contracture  PostOperative Diagnosis: Right foot pes planovalgus Right hindfoot valgus Posterior tibial tendon insufficiency Retained symptomatic deep orthopedic hardware right ankle Equinus contracture Calcaneonavicular ligament tear Navicular exostosis  PROCEDURE: Tendo Achilles lengthening Right medial displacement calcaneal osteotomy Right ankle deep orthopedic hardware removal Posterior tibial tendon and tendon sheath debridement Partial resection of navicular Calcaneal navicular ligament reconstruction Flexor digitorum longus deep tendon transfer   SURGEON: Melony Overly, MD  ASSISTANT: Levada Dy, RNFA  ANESTHESIA: General with peripheral nerve blockade  FINDINGS: See below  IMPLANTS: Arthrex fully threaded headless cannulated screws  INDICATIONS:54 y.o. female with symptomatic pes planovalgus and posterior tibial tendon insufficiency had continued pain and failed conservative treatment in the form of boot immobilization, anti-inflammatories, formal physical therapy.  MRI scan demonstrated tendinosis of her posterior tibial tendon and possible tearing due to retained orthopedic hardware of her medial malleolus with prominent screw heads.  Patient understood the risks, benefits and alternatives to surgery which include but are not limited to wound healing complications, infection, nonunion, malunion, need for further surgery as well as damage to surrounding structures. They also understood the potential for continued pain in that there were no guarantees of acceptable outcome After weighing these risks the patient opted to proceed with surgery.  PROCEDURE: Patient was identified in the preoperative holding area.  The right was marked by myself.   Consent was signed by myself and the patient.  Block was performed by anesthesia in the preoperative holding area.  Patient was taken to the operative suite and placed supine on the operative table.  General LMA anesthesia was induced without difficulty. Bump was placed under the operative hip and bone foam was used.  All bony prominences were well padded.  Tourniquet was placed on the operative thigh.  Preoperative antibiotics were given. The extremity was prepped and draped in the usual sterile fashion and surgical timeout was performed.  The limb was elevated and the tourniquet was inflated to 250 mmHg.  Tendo Achilles lengthening: We began by holding the foot in maximal dorsiflexed position.  Then a tendo Achilles lengthening was performed using an 11 blade through stab incisions centrally about the Achilles tendon.  3 hemisections were performed of the Achilles starting medially than laterally then finishing medially.  There is increase in her ankle dorsiflexion afterwards.  Medial displacement calcaneal osteotomy: We then turned our attention to the calcaneus.  A separate incision was created laterally overlying the calcaneus in an oblique fashion.  This was taken sharply down through skin and subcutaneous tissue.  Then blunt dissection was used to identify any branch of the sural nerve which were not identified within the surgical field.  The tissue overlying the lateral wall of the calcaneus was removed and the incision was carried deeper.  The periosteal tissue was incised and using a periosteal elevator the calcaneal wall was identified.  Then using fluoroscopy the appropriate position of the osteotomy was confirmed and using a sagittal saw an osteotomy was created through the tuberosity of the calcaneus.  This was taken to the medial wall.  Care was taken not to damage any structures medially.  The knee osteotome was placed within the osteotomy site and it was completed.  The calcaneus was then slid  medially approximately 5-7 mm.  The guidewires for the screws were then placed across the osteotomy site from  the posterior tuberosity to the level of the subchondral bone of the posterior facet of the subtalar joint within the calcaneus.  Fluoroscopy confirmed appropriate position of the wires and the screws were placed without difficulty through small stab incisions.  The osteotomy site was stable after fixation with the 2 headless compression screws.  The wound was then irrigated copiously with normal saline.  A bone tamp was used to smooth the bone edge from the osteotomy site laterally for a better contour.  The wound was reirrigated.  The wound was then closed in a layered fashion using 3-0 Monocryl and 3-0 nylon suture.  Deep orthopedic hardware removal: We then turned our attention to the medial ankle.  A separate incision was created over the posterior aspect of the medial malleolus.  This was taken sharply down through skin and subcutaneous tissue.  There is palpable hardware about the medial malleolus.  Skin flap was created anteriorly to gain access to the hardware.  There is a significant amount of scar tissue overlying the hardware.  This was removed bluntly with a Ronguer.  The 2 screw heads were then identified and the 2 medial malleolar screws were removed under direct visualization. This was done without difficulty.  Posterior tibial tendon and tendon sheath debridement: A separate deep incision was created posteriorly about the ankle overlying the posterior tibial tendon.  The posterior tibial tendon sheath was identified and incised in line with the posterior tibial tendon.  This was taken from the navicular tuberosity up to the posterior aspect of the distal tibia.  The tendon sheath was opened and there was a rush of synovial fluid.  There was synovitis within the tendon sheath.  The synovitic tissue was excised with a 15 blade.  The tendon was dislocated and inspected.  There is  tendinosis distally at the insertion at the navicular tuberosity.  The tendon was removed from the navicular tuberosity with a 15 blade.  This area of the tendon was then sharply debrided with the devitalized and tendon knot of tissue being completely removed and discarded.  More proximally the adhesions between the tendon sheath and the tendon were removed with a dissection scissor and excised.  The length of the tendon was inspected.  There was evidence of tendinosis at the level where it was traversing beside the prominent orthopedic hardware.  Partial resection of navicular: We then inspected the navicular tuberosity. Separate deep incision was created to allow for access to the dorsal aspect of the navicular. There was bony prominence there along the medial and dorsomedial aspect of the navicular..  Using a Ronguer and an osteotome partial resection of the navicular was completed to remove the exostosis and to create a more normal anatomic contour.The navicular was contoured and found to be acceptable.Then the tendon sheath was further incised to gain access to the flexor digitorum longus tendon.  The tendon was identified and traced distally down to the knot of Mallie Mussel.  The tendon was pulled proximally and transected at the level of the knot of Henry.  There was adequate tendon for the tendon transfer that was obtained.  The flexor digitorum longus tendon did not have evidence of tendinosis or tendon tearing.  Calcaneonavicular ligament reconstruction: We then inspected the calcaneonavicular ligament.  There is tearing and attenuation of the ligament at the level of the talonavicular joint.  The redundant tissue was excised with a 15 blade.  Then using a 2-0 FiberWire stitch the ligament was reconstructed and imbricated and tightened.The foot  was held in a abducted position when the ligament was reconstructed and afterwards was able to be maintained in this position due to the ligament  reconstruction.  Flexor digitorum longus deep tendon transfer: Then the flexor digitorum longus tendon was sutured using a fiber loop.  This was done to allow for tendon transfer to the navicular tuberosity.  Then the appropriate position on the navicular tuberosity was identified.  The guidepin for the Bio-Tenodesis screw was placed within the navicular and fluoroscopy confirmed appropriate position.  Then the tendon was sized and the appropriate Bio-Tenodesis screw was selected.  The drill tunnel was created for the tendon transfer.  The guidepin was then used to pull the suture material through the dorsal aspect of the foot.  The tendon was then pulled into the drill tunnel within the tuberosity.  The Bio-Tenodesis screw was placed.  The tendon transfer was done with the foot in maximal inversion and adduction.  There was good tension on the tendon after transfer.  Then the posterior tibial tendon was pulled and maximal tension and sutured to the flexor digit tendon distally.  This was done with a 2-0 FiberWire stitch.  Then periosteal tissue and distal aspect of the posterior tibial tendon was oversewed at the site of the deep tendon transfer.  Fluoroscopic images were then taken and the talonavicular coverage was acceptable.  The Bio-Tenodesis screw site was acceptable.  Wounds were then irrigated copiously with normal saline.  The tourniquet was released and hemostasis was obtained.  The tissue was closed in a layered fashion using 3-0 Monocryl and 3-0 nylon stitch.  This included the posterior tibial tendon sheath.  Soft dressing and a short leg nonweightbearing splint was placed.  She was awakened from anesthesia and taken to recovery in stable condition.  There were no complications.  Tolerated the procedure well.  POST OPERATIVE INSTRUCTIONS: Nonweightbearing to right lower extremity Keep splint dry and in place Call the office with concerns Follow-up in 2 weeks for splint removal, suture  removal if appropriate and placement of a short leg nonweightbearing cast.  BLOOD LOSS:  less than 50 mL         DRAINS: none         SPECIMEN: none       COMPLICATIONS:  * No complications entered in OR log *         Disposition: PACU - hemodynamically stable.         Condition: stable

## 2020-07-10 ENCOUNTER — Telehealth: Payer: Self-pay | Admitting: Family Medicine

## 2020-07-10 NOTE — Telephone Encounter (Signed)
Called Pt to schedule appointment per N Stoud to re establish Care  Pt contact info is not correct

## 2020-07-17 DIAGNOSIS — M25571 Pain in right ankle and joints of right foot: Secondary | ICD-10-CM | POA: Diagnosis not present

## 2020-07-30 ENCOUNTER — Ambulatory Visit: Payer: BLUE CROSS/BLUE SHIELD | Attending: Orthopaedic Surgery | Admitting: Physical Therapy

## 2020-07-30 ENCOUNTER — Encounter: Payer: Self-pay | Admitting: Physical Therapy

## 2020-07-30 ENCOUNTER — Other Ambulatory Visit: Payer: Self-pay

## 2020-07-30 DIAGNOSIS — M6281 Muscle weakness (generalized): Secondary | ICD-10-CM | POA: Insufficient documentation

## 2020-07-30 DIAGNOSIS — R600 Localized edema: Secondary | ICD-10-CM | POA: Diagnosis present

## 2020-07-30 DIAGNOSIS — M25671 Stiffness of right ankle, not elsewhere classified: Secondary | ICD-10-CM | POA: Diagnosis not present

## 2020-07-30 NOTE — Patient Instructions (Signed)
Access Code: [email or web address] URL: [email or web address] Date: 07/30/2020 Prepared by: Sue [name]  Exercises Long Sitting Soleus Stretch on Bolster with Strap - 1 x daily - 7 x weekly - 2 sets - 20-30 sec hold Long Sitting Calf Stretch with Strap - 1 x daily - 7 x weekly - 2 sets - 20-30 sec hold Long Sitting Ankle Dorsiflexion with Anchored Resistance - 1 x daily - 7 x weekly - 2 sets - 10 reps Long Sitting Ankle Plantar Flexion with Resistance - 1 x daily - 7 x weekly - 2 sets - 10 reps Long Sitting Ankle Eversion with Resistance - 1 x daily - 7 x weekly - 2 sets - 10 reps Long Sitting Ankle Inversion with Resistance - 1 x daily - 7 x weekly - 2 sets - 10 reps Ankle Inversion Eversion Towel Slide - 1 x daily - 7 x weekly - 2 sets - 10 reps Seated Toe Towel Scrunches - 1 x daily - 7 x weekly - 2 sets - 10 reps

## 2020-07-30 NOTE — Therapy (Signed)
Sue [name] [place], [place], [address] Phone: [phone]   Fax:  [phone]  Physical Therapy Evaluation  Patient Details  Name: Sue [name] MRN: [id number] Date of Birth: [birth year] Referring Provider (PT): Erle Crocker, MD   Encounter Date: 07/30/2020   PT End of Session - 07/30/20 1300    Visit Number 1    Number of Visits 27    Date for PT Re-Evaluation 09/24/20    PT Start Time 1310    PT Stop Time 1355    PT Time Calculation (min) 45 min    Activity Tolerance Patient tolerated treatment well    Behavior During Therapy [facility] for tasks assessed/performed           Past Medical History:  Diagnosis Date  . Breast cancer ([place]) 08/28/12   Left Breast  . Right foot pain   . S/P radiation therapy 12/06/12 -01/23/13   Left Breast/Axilla / 46 Gy / 23 Fractions with a Boost to Left Breast / 14 Gy / 7 Fractions  . Use of tamoxifen (Nolvadex) march 2014  . Wears glasses     Past Surgical History:  Procedure Laterality Date  . ACHILLES TENDON SURGERY Right 06/02/2020   Procedure: ACHILLES LENGTHENING/KIDNER;  Surgeon: Erle Crocker, MD;  Location: [place];  Service: Orthopedics;  Laterality: Right;  . BREAST LUMPECTOMY Left    takes tamoxifen  . CALCANEAL OSTEOTOMY Right 06/02/2020   Procedure: RIGHT LATERAL DISPLACEMENT CALCANEAL OSTEOTOMY, FLEXOR DIGITORUM LONGUS TRANSFER, SPRING LIGAMENT RECONSTRUCTION, POSTERIOR TIBIAL TENDON DEBRIDEMENT, DEEP ORTHOPEDIC HARDWARE REMOVAL, MEDIAL CUNEIFORM PLANTAR FLEXION OSTEOTOMY AND ACHILLES LENGTHENING, PARTIAL RESECTION OF NAVICULAR;  Surgeon: Erle Crocker, MD;  Location: [place];  Service: Orthopedics;  Ladean Raya  . DILATION AND CURETTAGE OF UTERUS     Following Miscarriage  . FOOT FUSION  3/09   ankle rt  . HARDWARE REMOVAL Right 06/02/2020   Procedure: HARDWARE REMOVAL;  Surgeon: Erle Crocker, MD;   Location: [place];  Service: Orthopedics;  Laterality: Right;  . Left Breast Lumpectomy  08/28/12  . 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 Microcalcifications Identified  . METATARSAL OSTEOTOMY Right 06/02/2020   Procedure: METATARSAL OSTEOTOMY;  Surgeon: Erle Crocker, MD;  Location: [place];  Service: Orthopedics;  Laterality: Right;  . RE-EXCISION OF BREAST CANCER,SUPERIOR MARGINS  10/30/2012   Procedure: RE-EXCISION OF BREAST CANCER,SUPERIOR MARGINS;  Surgeon: Adin Hector, MD;  Location: [facility];  Service: General;  Laterality: N/A;  left partial mastectomy with excision of margins  . re-excision of left breast cancer on 09/10/12      There were no vitals filed for this visit.    Subjective Assessment - 07/30/20 1312    Subjective Pt reports fracturing her R foot in '09; 2 rods were placed at that time. She notes that in 2018 she exercised wrong and had foot difficulties from then on until 2019/2020. Pt reports that she had issues with ankle movement and increased swelling. Pt got surgery 06/02/20 -- took rods out and repaired her posterior tibial tendon. Pt states she was told to wear her CAM boot all of the time -- she is R LE WBAT. Pt states she is still getting swelling. Pt just recently weaned herself to using cam boot and SPC only this past week -- she is getting used to wearing the boot and going  up/down steps. Pt states the bottom of her foot still feels numb but slowly going away. Pt states she is to follow up with ortho next month and may d/c CAM boot at that time.    Limitations Walking;House hold activities    How long can you sit comfortably? no issue    How long can you stand comfortably? Long periods    How long can you walk comfortably? Can amb 5-6 hours on/off in the house with the boot    Patient Stated Goals Walk again, return to exercising    Currently in Pain? No/denies     Effect of Pain on Daily Activities stair negotiation, walking, work              [facility] PT Assessment - 07/30/20 0001      Assessment   Medical Diagnosis S/P R Posterior tibial tendon repair    Referring Provider (PT) Erle Crocker, MD    Onset Date/Surgical Date 06/02/20    Prior Therapy None      Precautions   Precautions None      Restrictions   Weight Bearing Restrictions No   WBAT     Balance Screen   Has the patient fallen in the past 6 months Yes    How many times? 7    Has the patient had a decrease in activity level because of a fear of falling?  No    Is the patient reluctant to leave their home because of a fear of falling?  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      Observation/Other Assessments   Focus on Therapeutic Outcomes (FOTO)  n/a MCD      Observation/Other Assessments-Edema    Edema --   Pt reports edema; however, pt currently wearing compression     Sensation   Additional Comments Reports decreased sensation bottom of foot      AROM   Right Ankle Dorsiflexion -10    Right Ankle Plantar Flexion 28    Right Ankle Inversion 25    Right Ankle Eversion 5      PROM   Right Ankle Dorsiflexion -5    Right Ankle Plantar Flexion 35    Right Ankle Inversion 38    Right Ankle Eversion 15      Strength   Right Hip Flexion 5/5    Right Hip Extension 5/5    Right Hip External Rotation  5/5    Right Hip ABduction 5/5    Right Hip ADduction 5/5    Right Knee Flexion 5/5    Right Knee Extension 5/5    Right Ankle Dorsiflexion 4/5    Right Ankle Plantar Flexion 3+/5    Right Ankle Inversion 3+/5    Right Ankle Eversion 3+/5      Palpation     Palpation comment No tenderness/pain                      Objective measurements completed on examination: See above findings.               PT  Education - 07/30/20 1355    Education Details Exam findings, POC, HEP, scar massage.    Person(s) Educated Patient    Methods Explanation;Demonstration;Tactile cues;Verbal cues;Handout    Comprehension Verbalized understanding;Returned demonstration;Verbal cues required;Tactile cues required            PT Short Term Goals - 07/30/20 1404      PT SHORT TERM GOAL #1   Title Pt will be independent with initial HEP    Baseline Newly provided    Time 4    Period Weeks    Status New    Target Date 08/27/20      PT SHORT TERM GOAL #2   Title Pt will have improved ankle DF to at least 0 deg    Baseline 5-10 deg from neutral ankle    Time 4    Period Weeks    Status New    Target Date 08/27/20      PT SHORT TERM GOAL #3   Title Pt will be able to demonstrate step through gait pattern with LRAD    Baseline Antalgic gait with SPC    Time 4    Period Weeks    Status New    Target Date 08/27/20      PT SHORT TERM GOAL #4   Title Pt will be able to tolerate SLS for at least 20 sec    Baseline Unable    Time 4    Period Weeks    Status New    Target Date 08/27/20             PT Long Term Goals - 07/30/20 1409      PT LONG TERM GOAL #1   Title Pt will be independent with advanced HEP    Time 8    Period Weeks    Status New    Target Date 09/24/20      PT LONG TERM GOAL #2   Title Pt will have full ankle ROM to be able to safely negotiate steps    Time 8    Period Weeks    Status New    Target Date 09/24/20      PT LONG TERM GOAL #3   Title Pt will demonstrate at least 4+/5 ankle strength for improved stability    Time 8    Period Weeks    Status New    Target Date 09/24/20      PT LONG TERM GOAL #4   Title Pt will be able to return to community exercise program    Time 8    Period  Weeks    Status New    Target Date 09/24/20      PT LONG TERM GOAL #5   Title Pt will be able to amb with normal reciprocal gait pattern for community ambulation    Time 8    Period Weeks    Status New    Target Date 09/24/20                  Plan - 07/30/20 1358    Clinical Impression Statement Pt is a 54 y/o F s/p R posterior tibial tendon repair on 06/02/20. Pt demonstrates decreased ankle ROM, strength, antalgic gait, with decreased balance affecting her ability to safely amb, negotiate steps, and hindering work and home tasks. Pt would benefit from therapy to address these issues and return her to her PLOF.    Personal Factors and Comorbidities Age;Time since onset of  injury/illness/exacerbation;Past/Current Experience    Examination-Activity Limitations Caring for Others;Lift;Locomotion Level;Stairs    Examination-Participation Restrictions Cleaning;Community Activity;Occupation    Stability/Clinical Decision Making Stable/Uncomplicated    Clinical Decision Making Low    Rehab Potential Good    PT Frequency 2x / week    PT Duration 8 weeks    PT Treatment/Interventions ADLs/Self Care Home Management;Aquatic Therapy;Cryotherapy;Electrical Stimulation;Iontophoresis 4mg /ml Dexamethasone;Moist Heat;Ultrasound;Gait training;Stair training;Functional mobility training;Therapeutic activities;Therapeutic exercise;Balance training;Neuromuscular re-education;Patient/family education;Orthotic Fit/Training;Manual techniques;Scar mobilization;Dry needling;Taping;Vasopneumatic Device    PT Next Visit Plan Assess response to HEP. Continue ankle stretching/strengthening. Consider manual therapy if indicated (foot mobs?). Work on weightbearing and Personnel officer. Attempt stair training.    PT Home Exercise Plan Access Code: [email or web address]    Consulted and Agree with Plan of Care Patient           Patient will benefit from skilled therapeutic intervention in order to improve the following  deficits and impairments:  Abnormal gait, Decreased range of motion, Difficulty walking, Increased fascial restricitons, Decreased activity tolerance, Decreased balance, Hypomobility, Impaired flexibility, Decreased mobility, Decreased strength, Increased edema, Impaired sensation  Visit Diagnosis: Stiffness of right ankle, not elsewhere classified  Muscle weakness (generalized)  Localized edema     Problem List Patient Active Problem List   Diagnosis Date Noted  . Malignant neoplasm of upper-outer quadrant of left breast in female, estrogen receptor positive ([place]) 12/17/2013  . Mass of right axilla 05/02/2013  . Anemia in neoplastic disease 05/02/2013    [facility] April Ma L Annaleia Pence PT, DPT 07/30/2020, 2:18 PM  [facility] [address] [place], [place], [address] Phone: [phone]   Fax:  [phone]  Name: Sue [name] MRN: [id number] Date of Birth: July 03, 1966

## 2020-08-11 ENCOUNTER — Ambulatory Visit: Payer: BLUE CROSS/BLUE SHIELD

## 2020-08-14 ENCOUNTER — Ambulatory Visit: Payer: BLUE CROSS/BLUE SHIELD | Attending: Orthopaedic Surgery

## 2020-08-14 ENCOUNTER — Other Ambulatory Visit: Payer: Self-pay

## 2020-08-14 DIAGNOSIS — R600 Localized edema: Secondary | ICD-10-CM | POA: Insufficient documentation

## 2020-08-14 DIAGNOSIS — M25671 Stiffness of right ankle, not elsewhere classified: Secondary | ICD-10-CM | POA: Diagnosis not present

## 2020-08-14 DIAGNOSIS — M6281 Muscle weakness (generalized): Secondary | ICD-10-CM | POA: Diagnosis present

## 2020-08-14 NOTE — Therapy (Signed)
[place], [place], [address] Phone: [phone]   Fax:  [phone]  Physical Therapy Treatment  Patient Details  Name: Sue [name] MRN: [id number] Date of Birth: 05/08/1966 Referring Provider (PT): Erle Crocker, MD   Encounter Date: 08/14/2020   PT End of Session - 08/14/20 1020    Visit Number 2    Number of Visits 27    Date for PT Re-Evaluation 09/24/20    PT Start Time 3329    PT Stop Time 1100    PT Time Calculation (min) 45 min    Activity Tolerance Patient tolerated treatment well    Behavior During Therapy [facility] for tasks assessed/performed           Past Medical History:  Diagnosis Date  . Breast cancer ([place]) 08/28/12   Left Breast  . Right foot pain   . S/P radiation therapy 12/06/12 -01/23/13   Left Breast/Axilla / 46 Gy / 23 Fractions with a Boost to Left Breast / 14 Gy / 7 Fractions  . Use of tamoxifen (Nolvadex) march 2014  . Wears glasses     Past Surgical History:  Procedure Laterality Date  . ACHILLES TENDON SURGERY Right 06/02/2020   Procedure: ACHILLES LENGTHENING/KIDNER;  Surgeon: Erle Crocker, MD;  Location: [place];  Service: Orthopedics;  Laterality: Right;  . BREAST LUMPECTOMY Left    takes tamoxifen  . CALCANEAL OSTEOTOMY Right 06/02/2020   Procedure: RIGHT LATERAL DISPLACEMENT CALCANEAL OSTEOTOMY, FLEXOR DIGITORUM LONGUS TRANSFER, SPRING LIGAMENT RECONSTRUCTION, POSTERIOR TIBIAL TENDON DEBRIDEMENT, DEEP ORTHOPEDIC HARDWARE REMOVAL, MEDIAL CUNEIFORM PLANTAR FLEXION OSTEOTOMY AND ACHILLES LENGTHENING, PARTIAL RESECTION OF NAVICULAR;  Surgeon: Erle Crocker, MD;  Location: [place];  Service: Orthopedics;  Ladean Raya  . DILATION AND CURETTAGE OF UTERUS     Following Miscarriage  . FOOT FUSION  3/09   ankle rt  . HARDWARE REMOVAL Right 06/02/2020   Procedure: HARDWARE REMOVAL;  Surgeon: Erle Crocker, MD;   Location: [place];  Service: Orthopedics;  Laterality: Right;  . Left Breast Lumpectomy  08/28/12  . 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 Microcalcifications Identified  . METATARSAL OSTEOTOMY Right 06/02/2020   Procedure: METATARSAL OSTEOTOMY;  Surgeon: Erle Crocker, MD;  Location: [place];  Service: Orthopedics;  Laterality: Right;  . RE-EXCISION OF BREAST CANCER,SUPERIOR MARGINS  10/30/2012   Procedure: RE-EXCISION OF BREAST CANCER,SUPERIOR MARGINS;  Surgeon: Adin Hector, MD;  Location[facility];  Service: General;  Laterality: N/A;  left partial mastectomy with excision of margins  . re-excision of left breast cancer on 09/10/12      There were no vitals filed for this visit.   Subjective Assessment - 08/14/20 1018    Subjective Pt reports her ankle feels better but still tight. "It feels like I got a flip flop on", but when she feels it, it feels normal. She feels like there's still swelling in there and the bottom of the foot still feels funny.    Limitations Walking;House hold activities    How long can you sit comfortably? no issue    How long can you stand comfortably? Long periods    How long can you walk comfortably? Can amb 5-6 hours on/off in the house with the boot    Patient Stated Goals Walk again, return to exercising    Currently in Pain? No/denies  [facility] PT Treatment/Exercise - 08/14/20 0001      Exercises   Exercises Ankle      Ankle Exercises: Stretches   Soleus Stretch 2 reps;30 seconds    Soleus Stretch Limitations long sitting    Gastroc Stretch 2 reps;30 seconds    Gastroc Stretch Limitations long sitting      Ankle Exercises: Seated   Other Seated Ankle Exercises P/D/INV/EV c RTB x 15 ea   pt pulling from arms vs. using ankle, VCs and TCs to correct                   PT Short Term Goals -  07/30/20 1404      PT SHORT TERM GOAL #1   Title Pt will be independent with initial HEP    Baseline Newly provided    Time 4    Period Weeks    Status New    Target Date 08/27/20      PT SHORT TERM GOAL #2   Title Pt will have improved ankle DF to at least 0 deg    Baseline 5-10 deg from neutral ankle    Time 4    Period Weeks    Status New    Target Date 08/27/20      PT SHORT TERM GOAL #3   Title Pt will be able to demonstrate step through gait pattern with LRAD    Baseline Antalgic gait with SPC    Time 4    Period Weeks    Status New    Target Date 08/27/20      PT SHORT TERM GOAL #4   Title Pt will be able to tolerate SLS for at least 20 sec    Baseline Unable    Time 4    Period Weeks    Status New    Target Date 08/27/20             PT Long Term Goals - 07/30/20 1409      PT LONG TERM GOAL #1   Title Pt will be independent with advanced HEP    Time 8    Period Weeks    Status New    Target Date 09/24/20      PT LONG TERM GOAL #2   Title Pt will have full ankle ROM to be able to safely negotiate steps    Time 8    Period Weeks    Status New    Target Date 09/24/20      PT LONG TERM GOAL #3   Title Pt will demonstrate at least 4+/5 ankle strength for improved stability    Time 8    Period Weeks    Status New    Target Date 09/24/20      PT LONG TERM GOAL #4   Title Pt will be able to return to community exercise program    Time 8    Period Weeks    Status New    Target Date 09/24/20      PT LONG TERM GOAL #5   Title Pt will be able to amb with normal reciprocal gait pattern for community ambulation    Time 8    Period Weeks    Status New    Target Date 09/24/20                 Plan - 08/14/20 1021    Clinical Impression Statement Reviewed pt's HEP today to corrrect form and  optimize band exercises. Pt was performing too quickly and limiting ROM. She tolerated tx well with intro to manual therapy for improved circulation, soft  tissue extensibility, and ROM.    Personal Factors and Comorbidities Age;Time since onset of injury/illness/exacerbation;Past/Current Experience    Examination-Activity Limitations Caring for Others;Lift;Locomotion Level;Stairs    Examination-Participation Restrictions Cleaning;Community Activity;Occupation    Stability/Clinical Decision Making Stable/Uncomplicated    Rehab Potential Good    PT Frequency 2x / week    PT Duration 8 weeks    PT Treatment/Interventions ADLs/Self Care Home Management;Aquatic Therapy;Cryotherapy;Electrical Stimulation;Iontophoresis 4mg /ml Dexamethasone;Moist Heat;Ultrasound;Gait training;Stair training;Functional mobility training;Therapeutic activities;Therapeutic exercise;Balance training;Neuromuscular re-education;Patient/family education;Orthotic Fit/Training;Manual techniques;Scar mobilization;Dry needling;Taping;Vasopneumatic Device    PT Next Visit Plan Assess response to HEP. Continue ankle stretching/strengthening. Consider manual therapy if indicated (foot mobs?). Work on weightbearing and Personnel officer. Attempt stair training.    PT Home Exercise Plan Access Code: [email or web address]    Consulted and Agree with Plan of Care Patient           Patient will benefit from skilled therapeutic intervention in order to improve the following deficits and impairments:  Abnormal gait, Decreased range of motion, Difficulty walking, Increased fascial restricitons, Decreased activity tolerance, Decreased balance, Hypomobility, Impaired flexibility, Decreased mobility, Decreased strength, Increased edema, Impaired sensation  Visit Diagnosis: Stiffness of right ankle, not elsewhere classified  Muscle weakness (generalized)  Localized edema     Problem List Patient Active Problem List   Diagnosis Date Noted  . Malignant neoplasm of upper-outer quadrant of left breast in female, estrogen receptor positive ([place]) 12/17/2013  . Mass of right axilla 05/02/2013  . Anemia in  neoplastic disease 05/02/2013    I[place]ell [place], PT, DPT 08/14/2020, 11:03 AM  [facility] [address] [place], [place], [address] Phone: [phone]   Fax:  [phone]  Name: Sue [name] MRN: [id number] Date of Birth: 22-Dec-1965

## 2020-08-18 ENCOUNTER — Ambulatory Visit: Payer: BLUE CROSS/BLUE SHIELD

## 2020-08-20 ENCOUNTER — Ambulatory Visit: Payer: BLUE CROSS/BLUE SHIELD

## 2020-08-20 ENCOUNTER — Other Ambulatory Visit: Payer: Self-pay

## 2020-08-20 DIAGNOSIS — R600 Localized edema: Secondary | ICD-10-CM

## 2020-08-20 DIAGNOSIS — M6281 Muscle weakness (generalized): Secondary | ICD-10-CM

## 2020-08-20 DIAGNOSIS — M25671 Stiffness of right ankle, not elsewhere classified: Secondary | ICD-10-CM | POA: Diagnosis not present

## 2020-08-20 NOTE — Therapy (Signed)
[place], [place], [address] Phone: [phone]   Fax:  [phone]  Physical Therapy Treatment  Patient Details  Name: Sue [name] MRN: [id number] Date of Birth: 1966-05-21 Referring Provider (PT): Erle Crocker, MD   Encounter Date: 08/20/2020   PT End of Session - 08/20/20 0838    Visit Number 3    Number of Visits 27    Date for PT Re-Evaluation 09/24/20    PT Start Time 0840   pt late   PT Stop Time 0910    PT Time Calculation (min) 30 min    Activity Tolerance Patient tolerated treatment well    Behavior During Therapy [facility] for tasks assessed/performed           Past Medical History:  Diagnosis Date  . Breast cancer ([place]) 08/28/12   Left Breast  . Right foot pain   . S/P radiation therapy 12/06/12 -01/23/13   Left Breast/Axilla / 46 Gy / 23 Fractions with a Boost to Left Breast / 14 Gy / 7 Fractions  . Use of tamoxifen (Nolvadex) march 2014  . Wears glasses     Past Surgical History:  Procedure Laterality Date  . ACHILLES TENDON SURGERY Right 06/02/2020   Procedure: ACHILLES LENGTHENING/KIDNER;  Surgeon: Erle Crocker, MD;  Location: [place];  Service: Orthopedics;  Laterality: Right;  . BREAST LUMPECTOMY Left    takes tamoxifen  . CALCANEAL OSTEOTOMY Right 06/02/2020   Procedure: RIGHT LATERAL DISPLACEMENT CALCANEAL OSTEOTOMY, FLEXOR DIGITORUM LONGUS TRANSFER, SPRING LIGAMENT RECONSTRUCTION, POSTERIOR TIBIAL TENDON DEBRIDEMENT, DEEP ORTHOPEDIC HARDWARE REMOVAL, MEDIAL CUNEIFORM PLANTAR FLEXION OSTEOTOMY AND ACHILLES LENGTHENING, PARTIAL RESECTION OF NAVICULAR;  Surgeon: Erle Crocker, MD;  Location: [place];  Service: Orthopedics;  Ladean Raya  . DILATION AND CURETTAGE OF UTERUS     Following Miscarriage  . FOOT FUSION  3/09   ankle rt  . HARDWARE REMOVAL Right 06/02/2020   Procedure: HARDWARE REMOVAL;  Surgeon: Erle Crocker,  MD;  Location: [place];  Service: Orthopedics;  Laterality: Right;  . Left Breast Lumpectomy  08/28/12  . 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 Microcalcifications Identified  . METATARSAL OSTEOTOMY Right 06/02/2020   Procedure: METATARSAL OSTEOTOMY;  Surgeon: Erle Crocker, MD;  Location: [place];  Service: Orthopedics;  Laterality: Right;  . RE-EXCISION OF BREAST CANCER,SUPERIOR MARGINS  10/30/2012   Procedure: RE-EXCISION OF BREAST CANCER,SUPERIOR MARGINS;  Surgeon: Adin Hector, MD;  [facility];  Service: General;  Laterality: N/A;  left partial mastectomy with excision of margins  . re-excision of left breast cancer on 09/10/12      There were no vitals filed for this visit.   Subjective Assessment - 08/20/20 0842    Subjective She report ankle is tight.    She does HEP  daily.    Currently in Pain? No/denies                             [facility] Adult PT Treatment/Exercise - 08/20/20 0001      Knee/Hip Exercises: Supine   Bridges Both;15 reps    Other Supine Knee/Hip Exercises clams green band x 15      Knee/Hip Exercises: Sidelying   Hip ABduction Right;15 reps    Hip ABduction Limitations green band      Ankle Exercises:  Seated   Towel Crunch 5 reps   20 curls   Other Seated Ankle Exercises P/D/INV/EV c Green TB x 15 ea      Ankle Exercises: Stretches   Soleus Stretch 30 seconds;4 reps    Soleus Stretch Limitations  sitting    Gastroc Stretch 30 seconds;5 reps    Gastroc Stretch Limitations long sitting                  PT Education - 08/20/20 0847    Education Details Needed much cuing to do exercise correct. movement was from foot   HEP green band hip    Person(s) Educated Patient    Methods Explanation;Tactile cues;Handout;Verbal cues    Comprehension Verbal cues required;Need further instruction;Tactile cues required;Returned  demonstration            PT Short Term Goals - 07/30/20 1404      PT SHORT TERM GOAL #1   Title Pt will be independent with initial HEP    Baseline Newly provided    Time 4    Period Weeks    Status New    Target Date 08/27/20      PT SHORT TERM GOAL #2   Title Pt will have improved ankle DF to at least 0 deg    Baseline 5-10 deg from neutral ankle    Time 4    Period Weeks    Status New    Target Date 08/27/20      PT SHORT TERM GOAL #3   Title Pt will be able to demonstrate step through gait pattern with LRAD    Baseline Antalgic gait with SPC    Time 4    Period Weeks    Status New    Target Date 08/27/20      PT SHORT TERM GOAL #4   Title Pt will be able to tolerate SLS for at least 20 sec    Baseline Unable    Time 4    Period Weeks    Status New    Target Date 08/27/20             PT Long Term Goals - 07/30/20 1409      PT LONG TERM GOAL #1   Title Pt will be independent with advanced HEP    Time 8    Period Weeks    Status New    Target Date 09/24/20      PT LONG TERM GOAL #2   Title Pt will have full ankle ROM to be able to safely negotiate steps    Time 8    Period Weeks    Status New    Target Date 09/24/20      PT LONG TERM GOAL #3   Title Pt will demonstrate at least 4+/5 ankle strength for improved stability    Time 8    Period Weeks    Status New    Target Date 09/24/20      PT LONG TERM GOAL #4   Title Pt will be able to return to community exercise program    Time 8    Period Weeks    Status New    Target Date 09/24/20      PT LONG TERM GOAL #5   Title Pt will be able to amb with normal reciprocal gait pattern for community ambulation    Time 8    Period Weeks    Status New    Target Date  09/24/20                 Plan - 08/20/20 0839    Clinical Impression Statement Doing well min pain reporting cramping in thighs. Sees MD in a week .  Otherwise ROM is good and able to give resistance   Needs to work hip also     PT Treatment/Interventions ADLs/Self Care Home Management;Aquatic Therapy;Cryotherapy;Electrical Stimulation;Iontophoresis 4mg /ml Dexamethasone;Moist Heat;Ultrasound;Gait training;Stair training;Functional mobility training;Therapeutic activities;Therapeutic exercise;Balance training;Neuromuscular re-education;Patient/family education;Orthotic Fit/Training;Manual techniques;Scar mobilization;Dry needling;Taping;Vasopneumatic Device    PT Next Visit Plan Assess response to HEP. Continue ankle stretching/strengthening. Consider manual therapy if indicated (foot mobs?). Work on weightbearing and Personnel officer. Attempt stair training.    PT Home Exercise Plan Access Code: [email or web address]    Consulted and Agree with Plan of Care Patient           Patient will benefit from skilled therapeutic intervention in order to improve the following deficits and impairments:  Abnormal gait, Decreased range of motion, Difficulty walking, Increased fascial restricitons, Decreased activity tolerance, Decreased balance, Hypomobility, Impaired flexibility, Decreased mobility, Decreased strength, Increased edema, Impaired sensation  Visit Diagnosis: Stiffness of right ankle, not elsewhere classified  Muscle weakness (generalized)  Localized edema     Problem List Patient Active Problem List   Diagnosis Date Noted  . Malignant neoplasm of upper-outer quadrant of left breast in female, estrogen receptor positive ([place]) 12/17/2013  . Mass of right axilla 05/02/2013  . Anemia in neoplastic disease 05/02/2013    Darrel Hoover  PT  08/20/2020, 9:17 AM  [facility] [address] [place], [place], [address] Phone: [phone]   Fax:  [phone]  Name: Sue [name] MRN: [id number] Date of Birth: 12/25/1965

## 2020-08-20 NOTE — Patient Instructions (Signed)
Hip clam , bridge , side abduction daily 15-20 reps  green band issued.

## 2020-08-24 ENCOUNTER — Other Ambulatory Visit: Payer: Self-pay

## 2020-08-24 ENCOUNTER — Ambulatory Visit: Payer: BLUE CROSS/BLUE SHIELD

## 2020-08-24 DIAGNOSIS — M25671 Stiffness of right ankle, not elsewhere classified: Secondary | ICD-10-CM

## 2020-08-24 DIAGNOSIS — M6281 Muscle weakness (generalized): Secondary | ICD-10-CM

## 2020-08-24 DIAGNOSIS — R600 Localized edema: Secondary | ICD-10-CM

## 2020-08-24 NOTE — Therapy (Signed)
[place], [place], [address] Phone: [phone]   Fax:  [phone]  Physical Therapy Treatment  Patient Details  Name: Sue [name] MRN: [id number] Date of Birth: 1966-08-26 Referring Provider (PT): Erle Crocker, MD   Encounter Date: 08/24/2020   PT End of Session - 08/24/20 0920    Visit Number 4    Number of Visits 27    Date for PT Re-Evaluation 10/02/20    Authorization Type [employer] MCD    Authorization - Visit Number 4    Authorization - Number of Visits 27    PT Start Time 0920    PT Stop Time 1000    PT Time Calculation (min) 40 min    Activity Tolerance Patient tolerated treatment well;No increased pain    Behavior During Therapy [facility] for tasks assessed/performed           Past Medical History:  Diagnosis Date   Breast cancer ([place]) 08/28/12   Left Breast   Right foot pain    S/P radiation therapy 12/06/12 -01/23/13   Left Breast/Axilla / 46 Gy / 23 Fractions with a Boost to Left Breast / 14 Gy / 7 Fractions   Use of tamoxifen (Nolvadex) march 2014   Wears glasses     Past Surgical History:  Procedure Laterality Date   ACHILLES TENDON SURGERY Right 06/02/2020   Procedure: ACHILLES LENGTHENING/KIDNER;  Surgeon: Erle Crocker, MD;  Location: [place];  Service: Orthopedics;  Laterality: Right;   BREAST LUMPECTOMY Left    takes tamoxifen   CALCANEAL OSTEOTOMY Right 06/02/2020   Procedure: RIGHT LATERAL DISPLACEMENT CALCANEAL OSTEOTOMY, FLEXOR DIGITORUM LONGUS TRANSFER, SPRING LIGAMENT RECONSTRUCTION, POSTERIOR TIBIAL TENDON DEBRIDEMENT, DEEP ORTHOPEDIC HARDWARE REMOVAL, MEDIAL CUNEIFORM PLANTAR FLEXION OSTEOTOMY AND ACHILLES LENGTHENING, PARTIAL RESECTION OF NAVICULAR;  Surgeon: Erle Crocker, MD;  Location: ;  Service: Orthopedics;  Latera   DILATION AND CURETTAGE OF UTERUS     Following Miscarriage   FOOT FUSION  3/09    ankle rt   HARDWARE REMOVAL Right 06/02/2020   Procedure: HARDWARE REMOVAL;  Surgeon: Erle Crocker, MD;  Location: [place];  Service: Orthopedics;  Laterality: Right;   Left Breast Lumpectomy  08/28/12   Left Breast Needle Core Biopsy  07/26/12   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 Microcalcifications Identified   METATARSAL OSTEOTOMY Right 06/02/2020   Procedure: METATARSAL OSTEOTOMY;  Surgeon: Erle Crocker, MD;  Location: [place];  Service: Orthopedics;  Laterality: Right;   RE-EXCISION OF BREAST CANCER,SUPERIOR MARGINS  10/30/2012   Procedure: RE-EXCISION OF BREAST CANCER,SUPERIOR MARGINS;  Surgeon: Adin Hector, MD;  [facility];  Service: General;  Laterality: N/A;  left partial mastectomy with excision of margins   re-excision of left breast cancer on 09/10/12      There were no vitals filed for this visit.   Subjective Assessment - 08/24/20 0923    Subjective Ankle feels Ok Last night had some dorsal ankle pain.    Currently in Pain? No/denies              [facility] PT Assessment - 08/24/20 0001      AROM   Right Ankle Dorsiflexion 10    Right Ankle Plantar Flexion 30    Right Ankle Inversion 22    Right Ankle Eversion 15      PROM   Right Ankle Dorsiflexion 12    Right  Ankle Plantar Flexion 35    Right Ankle Inversion 32    Right Ankle Eversion 18      Strength   Right Hip Flexion 5/5    Right Hip Extension 5/5    Right Hip External Rotation  5/5    Right Hip ABduction 5/5    Right Hip ADduction 5/5    Right Knee Flexion 5/5    Right Knee Extension 5/5    Right Ankle Dorsiflexion 5/5    Right Ankle Plantar Flexion 4-/5    Right Ankle Inversion 5/5    Right Ankle Eversion 5/5                         [facility] Adult PT Treatment/Exercise - 08/24/20 0001      Ankle Exercises: Aerobic   Nustep LE only 5 min L2      Ankle Exercises: Seated   Other Seated Ankle Exercises  P/D/INV/EV c Green TB x 15 ea    Other Seated Ankle Exercises Active DF /PF       Ankle Exercises: Stretches   Soleus Stretch 2 reps;30 seconds    Soleus Stretch Limitations  sitting    Gastroc Stretch 2 reps;30 seconds    Gastroc Stretch Limitations long sitting                  PF stretching and issued for HEP.  Manual:   ROM all planes, foot and ankle mobs Gr 3-4           PT Short Term Goals - 08/24/20 1006      PT SHORT TERM GOAL #1   Title Pt will be independent with initial HEP    Status Achieved      PT SHORT TERM GOAL #2   Title Pt will have improved ankle DF to at least 0 deg    Baseline 10 deg    Status Achieved      PT SHORT TERM GOAL #3   Title Pt will be able to demonstrate step through gait pattern with LRAD    Baseline step through short step no device    Status Achieved      PT SHORT TERM GOAL #4   Title Pt will be able to tolerate SLS for at least 20 sec    Baseline not tested    Status On-going             PT Long Term Goals - 07/30/20 1409      PT LONG TERM GOAL #1   Title Pt will be independent with advanced HEP    Time 8    Period Weeks    Status New    Target Date 09/24/20      PT LONG TERM GOAL #2   Title Pt will have full ankle ROM to be able to safely negotiate steps    Time 8    Period Weeks    Status New    Target Date 09/24/20      PT LONG TERM GOAL #3   Title Pt will demonstrate at least 4+/5 ankle strength for improved stability    Time 8    Period Weeks    Status New    Target Date 09/24/20      PT LONG TERM GOAL #4   Title Pt will be able to return to community exercise program    Time 8    Period Weeks    Status  New    Target Date 09/24/20      PT LONG TERM GOAL #5   Title Pt will be able to amb with normal reciprocal gait pattern for community ambulation    Time 8    Period Weeks    Status New    Target Date 09/24/20                 Plan - 08/24/20 0920    Clinical Impression Statement She  is having intermittant pain at home and this appears to be normal.  Sees  MD friday this week.  ROM and strength have improved    All but 1 STG met. .  Will progress to closed chain execrise and hope the  MD allows her out of boot.    PT Frequency 2x / week    PT Duration 6 weeks    PT Treatment/Interventions ADLs/Self Care Home Management;Aquatic Therapy;Cryotherapy;Electrical Stimulation;Iontophoresis 37m/ml Dexamethasone;Moist Heat;Ultrasound;Gait training;Stair training;Functional mobility training;Therapeutic activities;Therapeutic exercise;Balance training;Neuromuscular re-education;Patient/family education;Orthotic Fit/Training;Manual techniques;Scar mobilization;Dry needling;Taping;Vasopneumatic Device    PT Next Visit Plan . Continue ankle stretching/strengthening     manual      Work on weightbearing and gait training. Attempt stair training.    PT Home Exercise Plan Access Code: [email or web address], PF stretch    Consulted and Agree with Plan of Care Patient           Patient will benefit from skilled therapeutic intervention in order to improve the following deficits and impairments:  Abnormal gait, Decreased range of motion, Difficulty walking, Increased fascial restricitons, Decreased activity tolerance, Decreased balance, Hypomobility, Impaired flexibility, Decreased mobility, Decreased strength, Increased edema, Impaired sensation  Visit Diagnosis: Stiffness of right ankle, not elsewhere classified  Muscle weakness (generalized)  Localized edema     Problem List Patient Active Problem List   Diagnosis Date Noted   Malignant neoplasm of upper-outer quadrant of left breast in [place], [place] 12/17/2013   Mass of right axilla 05/02/2013   Anemia in neoplastic disease 05/02/2013    CDarrel Hoover PT 08/24/2020, 10:10 AM  [facility] [address]Phone: [phone]   Fax:  [phone] Name: Sue GALLOGLYMRN: [id number] of Birth: 6Apr 30, 1967

## 2020-08-27 ENCOUNTER — Ambulatory Visit: Payer: BLUE CROSS/BLUE SHIELD

## 2020-08-31 DIAGNOSIS — M25571 Pain in right ankle and joints of right foot: Secondary | ICD-10-CM | POA: Diagnosis not present

## 2020-09-04 ENCOUNTER — Ambulatory Visit: Payer: BLUE CROSS/BLUE SHIELD

## 2020-09-07 ENCOUNTER — Ambulatory Visit: Payer: BLUE CROSS/BLUE SHIELD

## 2020-09-17 ENCOUNTER — Ambulatory Visit: Payer: BLUE CROSS/BLUE SHIELD | Attending: Orthopaedic Surgery

## 2020-09-17 ENCOUNTER — Other Ambulatory Visit: Payer: Self-pay

## 2020-09-17 DIAGNOSIS — M6281 Muscle weakness (generalized): Secondary | ICD-10-CM | POA: Diagnosis present

## 2020-09-17 DIAGNOSIS — M25671 Stiffness of right ankle, not elsewhere classified: Secondary | ICD-10-CM | POA: Diagnosis present

## 2020-09-17 DIAGNOSIS — R600 Localized edema: Secondary | ICD-10-CM | POA: Diagnosis not present

## 2020-09-17 NOTE — Therapy (Signed)
[place], [place], [address] Phone: [phone]   Fax:  [phone]  Physical Therapy Treatment  Patient Details  Name: Sue [name] MRN: [id number] Date of Birth: 1965-12-10 Referring Provider (PT): Erle Crocker, MD   Encounter Date: 09/17/2020   PT End of Session - 09/17/20 0711    Visit Number 5    Number of Visits 27    Date for PT Re-Evaluation 10/30/20    Authorization Type UHC MCD    Authorization - Visit Number 5    Authorization - Number of Visits 27    PT Start Time 0710   pt late   PT Stop Time 1157    PT Time Calculation (min) 33 min    Activity Tolerance Patient tolerated treatment well;No increased pain    Behavior During Therapy [facility] for tasks assessed/performed           Past Medical History:  Diagnosis Date   Breast cancer ([place]) 08/28/12   Left Breast   Right foot pain    S/P radiation therapy 12/06/12 -01/23/13   Left Breast/Axilla / 46 Gy / 23 Fractions with a Boost to Left Breast / 14 Gy / 7 Fractions   Use of tamoxifen (Nolvadex) march 2014   Wears glasses     Past Surgical History:  Procedure Laterality Date   ACHILLES TENDON SURGERY Right 06/02/2020   Procedure: ACHILLES LENGTHENING/KIDNER;  Surgeon: Erle Crocker, MD;  Location: [place];  Service: Orthopedics;  Laterality: Right;   BREAST LUMPECTOMY Left    takes tamoxifen   CALCANEAL OSTEOTOMY Right 06/02/2020   Procedure: RIGHT LATERAL DISPLACEMENT CALCANEAL OSTEOTOMY, FLEXOR DIGITORUM LONGUS TRANSFER, SPRING LIGAMENT RECONSTRUCTION, POSTERIOR TIBIAL TENDON DEBRIDEMENT, DEEP ORTHOPEDIC HARDWARE REMOVAL, MEDIAL CUNEIFORM PLANTAR FLEXION OSTEOTOMY AND ACHILLES LENGTHENING, PARTIAL RESECTION OF NAVICULAR;  Surgeon: Erle Crocker, MD;  Location: [place];  Service: Orthopedics;  Latera   DILATION AND CURETTAGE OF UTERUS     Following Miscarriage   FOOT  FUSION  3/09   ankle rt   HARDWARE REMOVAL Right 06/02/2020   Procedure: HARDWARE REMOVAL;  Surgeon: Erle Crocker, MD;  Location: [place];  Service: Orthopedics;  Laterality: Right;   Left Breast Lumpectomy  08/28/12   Left Breast Needle Core Biopsy  07/26/12   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 Microcalcifications Identified   METATARSAL OSTEOTOMY Right 06/02/2020   Procedure: METATARSAL OSTEOTOMY;  Surgeon: Erle Crocker, MD;  Location: [place];  Service: Orthopedics;  Laterality: Right;   RE-EXCISION OF BREAST CANCER,SUPERIOR MARGINS  10/30/2012   Procedure: RE-EXCISION OF BREAST CANCER,SUPERIOR MARGINS;  Surgeon: Adin Hector, MD;  [facility];  Service: General;  Laterality: N/A;  left partial mastectomy with excision of margins   re-excision of left breast cancer on 09/10/12      There were no vitals filed for this visit.   Subjective Assessment - 09/17/20 0713    Subjective She has seen MD and he wanted to continue PT. He said foot doing well.   He said continue boot and issued brace. He said to  wear brace. She wears brace with boot .    Currently in Pain? No/denies              [facility] PT Assessment - 09/17/20 0001      Assessment   Medical Diagnosis S/P R Posterior tibial tendon repair    Referring Provider (  PT) Erle Crocker, MD    Onset Date/Surgical Date 06/02/20      Precautions   Precautions None    Required Braces or Orthoses Other Brace/Splint    Other Brace/Splint Now has a brace to wear in place of boot      Restrictions   Weight Bearing Restrictions No      Cognition   Overall Cognitive Status Within Functional Limits for tasks assessed      AROM   Right Ankle Dorsiflexion 10    Right Ankle Plantar Flexion 30    Right Ankle Inversion 22    Right Ankle Eversion 10      Strength   Right Ankle Dorsiflexion 5/5    Right Ankle Plantar Flexion 4/5    Right Ankle Inversion  5/5    Right Ankle Eversion 5/5            All HEP was reviewed as it appeared she was not doing them correctly.  The program was printed and issued again.                        PT Education - 09/17/20 0715    Education Details Wear brace without the boot with a shoe.   All HEP reviewed    Person(s) Educated Patient    Methods Explanation;Demonstration;Tactile cues;Verbal cues;Handout    Comprehension Verbalized understanding;Returned demonstration            PT Short Term Goals - 08/24/20 1006      PT SHORT TERM GOAL #1   Title Pt will be independent with initial HEP    Status Achieved      PT SHORT TERM GOAL #2   Title Pt will have improved ankle DF to at least 0 deg    Baseline 10 deg    Status Achieved      PT SHORT TERM GOAL #3   Title Pt will be able to demonstrate step through gait pattern with LRAD    Baseline step through short step no device    Status Achieved      PT SHORT TERM GOAL #4   Title Pt will be able to tolerate SLS for at least 20 sec    Baseline not tested    Status On-going             PT Long Term Goals - 07/30/20 1409      PT LONG TERM GOAL #1   Title Pt will be independent with advanced HEP    Time 8    Period Weeks    Status New    Target Date 09/24/20      PT LONG TERM GOAL #2   Title Pt will have full ankle ROM to be able to safely negotiate steps    Time 8    Period Weeks    Status New    Target Date 09/24/20      PT LONG TERM GOAL #3   Title Pt will demonstrate at least 4+/5 ankle strength for improved stability    Time 8    Period Weeks    Status New    Target Date 09/24/20      PT LONG TERM GOAL #4   Title Pt will be able to return to community exercise program    Time 8    Period Weeks    Status New    Target Date 09/24/20      PT LONG TERM  GOAL #5   Title Pt will be able to amb with normal reciprocal gait pattern for community ambulation    Time 8    Period Weeks    Status New     Target Date 09/24/20                 Plan - 09/17/20 0711    Clinical Impression Statement After instruction she was able to do the HE{P correctly. Reissued handout.   issued green band.  She I think was mistaken about use of new brace so next visit we will progress her out of boot to walking with brace and shoe.    PT Frequency 1x / week    PT Duration 4 weeks    PT Treatment/Interventions ADLs/Self Care Home Management;Aquatic Therapy;Cryotherapy;Electrical Stimulation;Iontophoresis 4mg /ml Dexamethasone;Moist Heat;Ultrasound;Gait training;Stair training;Functional mobility training;Therapeutic activities;Therapeutic exercise;Balance training;Neuromuscular re-education;Patient/family education;Orthotic Fit/Training;Manual techniques;Scar mobilization;Dry needling;Taping;Vasopneumatic Device    PT Next Visit Plan . Continue ankle stretching/strengthening     manual      Work on weightbearing and gait training. Attempt stair training.  ASO and shoe    PT Home Exercise Plan Access Code: [email or web address], PF stretch    Consulted and Agree with Plan of Care Patient           Patient will benefit from skilled therapeutic intervention in order to improve the following deficits and impairments:  Abnormal gait, Decreased range of motion, Difficulty walking, Increased fascial restricitons, Decreased activity tolerance, Decreased balance, Hypomobility, Impaired flexibility, Decreased mobility, Decreased strength, Increased edema, Impaired sensation  Visit Diagnosis: Localized edema  Muscle weakness (generalized)  Stiffness of right ankle, not elsewhere classified     Problem List Patient Active Problem List   Diagnosis Date Noted   Malignant neoplasm of upper-outer quadrant of left breast in female, estrogen receptor positive ([place]) 12/17/2013   Mass of right axilla 05/02/2013   Anemia in neoplastic disease 05/02/2013    Darrel Hoover  PT 09/17/2020, 7:58 AM  [facility] [address] [place], [place], [address] Phone: [phone]   Fax:  [phone]  Name: ARELI FRARY MRN: [id number] Date of Birth: [birth year]

## 2020-10-15 ENCOUNTER — Ambulatory Visit: Payer: BLUE CROSS/BLUE SHIELD

## 2020-10-16 ENCOUNTER — Other Ambulatory Visit: Payer: Self-pay

## 2020-10-16 ENCOUNTER — Ambulatory Visit: Payer: BLUE CROSS/BLUE SHIELD | Attending: Orthopaedic Surgery

## 2020-10-16 DIAGNOSIS — M6281 Muscle weakness (generalized): Secondary | ICD-10-CM | POA: Insufficient documentation

## 2020-10-16 DIAGNOSIS — R600 Localized edema: Secondary | ICD-10-CM | POA: Diagnosis present

## 2020-10-16 DIAGNOSIS — M25671 Stiffness of right ankle, not elsewhere classified: Secondary | ICD-10-CM | POA: Insufficient documentation

## 2020-10-16 NOTE — Therapy (Signed)
[place], [place], [address] Phone: [phone]   Fax:  [phone]  Physical Therapy Treatment  Patient Details  Name: ZILDA NO MRN: [id number] Date of Birth: 1966/02/14 Referring Provider (PT): Erle Crocker, MD   Encounter Date: 10/16/2020   PT End of Session - 10/16/20 1030    Visit Number 6    Number of Visits 27    Date for PT Re-Evaluation 10/30/20    Authorization Type UHC MCD    Authorization - Visit Number 5    Authorization - Number of Visits 27    PT Start Time 1020    PT Stop Time 1100    PT Time Calculation (min) 40 min    Activity Tolerance Patient tolerated treatment well;No increased pain    Behavior During Therapy [facility] for tasks assessed/performed           Past Medical History:  Diagnosis Date   Breast cancer ([place]) 08/28/12   Left Breast   Right foot pain    S/P radiation therapy 12/06/12 -01/23/13   Left Breast/Axilla / 46 Gy / 23 Fractions with a Boost to Left Breast / 14 Gy / 7 Fractions   Use of tamoxifen (Nolvadex) march 2014   Wears glasses     Past Surgical History:  Procedure Laterality Date   ACHILLES TENDON SURGERY Right 06/02/2020   Procedure: ACHILLES LENGTHENING/KIDNER;  Surgeon: Erle Crocker, MD;  Location: [place];  Service: Orthopedics;  Laterality: Right;   BREAST LUMPECTOMY Left    takes tamoxifen   CALCANEAL OSTEOTOMY Right 06/02/2020   Procedure: RIGHT LATERAL DISPLACEMENT CALCANEAL OSTEOTOMY, FLEXOR DIGITORUM LONGUS TRANSFER, SPRING LIGAMENT RECONSTRUCTION, POSTERIOR TIBIAL TENDON DEBRIDEMENT, DEEP ORTHOPEDIC HARDWARE REMOVAL, MEDIAL CUNEIFORM PLANTAR FLEXION OSTEOTOMY AND ACHILLES LENGTHENING, PARTIAL RESECTION OF NAVICULAR;  Surgeon: Erle Crocker, MD;  Location: [place];  Service: Orthopedics;  Latera   DILATION AND CURETTAGE OF UTERUS     Following Miscarriage   FOOT FUSION  3/09    ankle rt   HARDWARE REMOVAL Right 06/02/2020   Procedure: HARDWARE REMOVAL;  Surgeon: Erle Crocker, MD;  Location: [place];  Service: Orthopedics;  Laterality: Right;   Left Breast Lumpectomy  08/28/12   Left Breast Needle Core Biopsy  07/26/12   UOQ - Ductal Carcinoma In Situ with Necrosis. Microcalcifications Identified   METATARSAL OSTEOTOMY Right 06/02/2020   Procedure: METATARSAL OSTEOTOMY;  Surgeon: Erle Crocker, MD;  Location: [place];  Service: Orthopedics;  Laterality: Right;   RE-EXCISION OF BREAST CANCER,SUPERIOR MARGINS  10/30/2012   Procedure: RE-EXCISION OF BREAST CANCER,SUPERIOR MARGINS;  Surgeon: Adin Hector, MD;  [facility];  Service: General;  Laterality: N/A;  left partial mastectomy with excision of margins   re-excision of left breast cancer on 09/10/12      There were no vitals filed for this visit.   Subjective Assessment - 10/16/20 1023    Subjective Pt reports she is now wearing brace and not boot. She has not been having much pain, but has been feeling a bit the last couple of days. She had her booster shot the other day, so feels a little off today. "The tightness is not as bad and I do not feel the screws in my heel anymore".    Currently in Pain? No/denies              [facility] PT Assessment - 10/16/20 0001  AROM   Right Ankle Dorsiflexion 10    Right Ankle Plantar Flexion 36    Right Ankle Inversion 22    Right Ankle Eversion 10      Strength   Right Ankle Dorsiflexion 5/5    Right Ankle Inversion 5/5    Right Ankle Eversion 5/5                         [facility] Adult PT Treatment/Exercise - 10/16/20 0001      Exercises   Exercises Knee/Hip      Knee/Hip Exercises: Standing   Stairs 3 rounds 4" steps with BHR, 3 rounds 6" steps with BHR reciprocal pattern with decreased time on RLE when descending steps leading with LLE    SLS 20s L, 7s R      Ankle Exercises:  Aerobic   Nustep LE only 5 min L5      Ankle Exercises: Stretches   Gastroc Stretch Limitations off edge of step 2x30" B    Other Stretch knee to wall DF RLE x10                    PT Short Term Goals - 10/16/20 1101      PT SHORT TERM GOAL #1   Title Pt will be independent with initial HEP    Status Achieved      PT SHORT TERM GOAL #2   Title Pt will have improved ankle DF to at least 0 deg    Baseline 10 deg    Status Achieved      PT SHORT TERM GOAL #3   Title Pt will be able to demonstrate step through gait pattern with LRAD    Baseline step through short step no device    Status Achieved      PT SHORT TERM GOAL #4   Title Pt will be able to tolerate SLS for at least 20 sec    Baseline 7s R, 20s L    Status On-going             PT Long Term Goals - 10/16/20 1038      PT LONG TERM GOAL #1   Title Pt will be independent with advanced HEP    Status On-going      PT LONG TERM GOAL #2   Title Pt will have full ankle ROM to be able to safely negotiate steps    Baseline continues to report feeling of heaviness in R LE when ascending stairs    Status On-going      PT LONG TERM GOAL #3   Title Pt will demonstrate at least 4+/5 ankle strength for improved stability    Baseline 5/5 DF, INV, EV; not PF    Status On-going      PT LONG TERM GOAL #4   Title Pt will be able to return to community exercise program    Baseline not yet returned    Status On-going      PT LONG TERM GOAL #5   Title Pt will be able to amb with normal reciprocal gait pattern for community ambulation    Baseline decr stance time R LE and shortened step lengths/heights    Status On-going                 Plan - 10/16/20 1030    Clinical Impression Statement Pt returns to PT after nearly a month with improvement in R ankle  PF and maintenance of other ROM and strength. She continues to ambulate with decreased stance time no RLE and quick transition to swing phase, as well as  difficulty with descending steps. Pt edu to focus on gastroc stretching off edge of step and SLS this weekend before next visit.    PT Frequency 1x / week    PT Duration 4 weeks    PT Treatment/Interventions ADLs/Self Care Home Management;Aquatic Therapy;Cryotherapy;Electrical Stimulation;Iontophoresis 4mg /ml Dexamethasone;Moist Heat;Ultrasound;Gait training;Stair training;Functional mobility training;Therapeutic activities;Therapeutic exercise;Balance training;Neuromuscular re-education;Patient/family education;Orthotic Fit/Training;Manual techniques;Scar mobilization;Dry needling;Taping;Vasopneumatic Device    PT Next Visit Plan . Continue ankle stretching/strengthening     manual      Work on weightbearing and gait training. Attempt stair training.  ASO and shoe    PT Home Exercise Plan Access Code: [email or web address], PF stretch    Consulted and Agree with Plan of Care Patient           Patient will benefit from skilled therapeutic intervention in order to improve the following deficits and impairments:  Abnormal gait, Decreased range of motion, Difficulty walking, Increased fascial restricitons, Decreased activity tolerance, Decreased balance, Hypomobility, Impaired flexibility, Decreased mobility, Decreased strength, Increased edema, Impaired sensation  Visit Diagnosis: Localized edema  Muscle weakness (generalized)  Stiffness of right ankle, not elsewhere classified     Problem List Patient Active Problem List   Diagnosis Date Noted   Malignant neoplasm of upper-outer quadrant of left breast in female, estrogen receptor positive ([place]) 12/17/2013   Mass of right axilla 05/02/2013   Anemia in neoplastic disease 05/02/2013    Izell Hamilton, PT, DPT 10/16/2020, 11:02 AM  [facility] [address] [place], [place], [address] Phone: [phone]   Fax:  [phone]  Name: AVIGAYIL TON MRN: [id number] Date of Birth: 21-Oct-1966

## 2020-10-19 ENCOUNTER — Other Ambulatory Visit: Payer: Self-pay

## 2020-10-19 ENCOUNTER — Ambulatory Visit: Payer: BLUE CROSS/BLUE SHIELD

## 2020-10-19 DIAGNOSIS — R600 Localized edema: Secondary | ICD-10-CM | POA: Diagnosis not present

## 2020-10-19 DIAGNOSIS — M25671 Stiffness of right ankle, not elsewhere classified: Secondary | ICD-10-CM

## 2020-10-19 DIAGNOSIS — M6281 Muscle weakness (generalized): Secondary | ICD-10-CM

## 2020-10-19 NOTE — Patient Instructions (Addendum)
Band PF x 20 daily RT  Braiding and side stepping  X 20 RT/LT daily Single leg balance 30 sec as able RT daily

## 2020-10-19 NOTE — Therapy (Addendum)
[place], [place], [address] Phone: [phone]   Fax:  [phone]  Physical Therapy Treatment/Discharge  Patient Details  Name: Sue [name] MRN: [id number] Date of Birth: [birth year] Referring Provider (PT): Erle Crocker, MD   Encounter Date: 10/19/2020   PT End of Session - 10/19/20 0956    Visit Number 7    Number of Visits 27    Date for PT Re-Evaluation 10/30/20    Authorization Type UHC MCD    Authorization - Visit Number 6    Authorization - Number of Visits 27    PT Start Time [phone]    PT Stop Time 1037    PT Time Calculation (min) 39 min    Activity Tolerance Patient tolerated treatment well;No increased pain    Behavior During Therapy WFL for tasks assessed/performed           Past Medical History:  Diagnosis Date  . Breast cancer ([place]) 08/28/12   Left Breast  . Right foot pain   . S/P radiation therapy 12/06/12 -01/23/13   Left Breast/Axilla / 46 Gy / 23 Fractions with a Boost to Left Breast / 14 Gy / 7 Fractions  . Use of tamoxifen (Nolvadex) march 2014  . Wears glasses     Past Surgical History:  Procedure Laterality Date  . ACHILLES TENDON SURGERY Right 06/02/2020   Procedure: ACHILLES LENGTHENING/KIDNER;  Surgeon: Erle Crocker, MD;  Location: [place];  Service: Orthopedics;  Laterality: Right;  . BREAST LUMPECTOMY Left    takes tamoxifen  . CALCANEAL OSTEOTOMY Right 06/02/2020   Procedure: RIGHT LATERAL DISPLACEMENT CALCANEAL OSTEOTOMY, FLEXOR DIGITORUM LONGUS TRANSFER, SPRING LIGAMENT RECONSTRUCTION, POSTERIOR TIBIAL TENDON DEBRIDEMENT, DEEP ORTHOPEDIC HARDWARE REMOVAL, MEDIAL CUNEIFORM PLANTAR FLEXION OSTEOTOMY AND ACHILLES LENGTHENING, PARTIAL RESECTION OF NAVICULAR;  Surgeon: Erle Crocker, MD;  Location: [place];  Service: Orthopedics;  Ladean Raya  . DILATION AND CURETTAGE OF UTERUS     Following Miscarriage  . FOOT  FUSION  3/09   ankle rt  . HARDWARE REMOVAL Right 06/02/2020   Procedure: HARDWARE REMOVAL;  Surgeon: Erle Crocker, MD;  Location: [place];  Service: Orthopedics;  Laterality: Right;  . Left Breast Lumpectomy  08/28/12  . 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 Microcalcifications Identified  . METATARSAL OSTEOTOMY Right 06/02/2020   Procedure: METATARSAL OSTEOTOMY;  Surgeon: Erle Crocker, MD;  Location: [place];  Service: Orthopedics;  Laterality: Right;  . RE-EXCISION OF BREAST CANCER,SUPERIOR MARGINS  10/30/2012   Procedure: RE-EXCISION OF BREAST CANCER,SUPERIOR MARGINS;  Surgeon: Adin Hector, MD;  [facility];  Service: General;  Laterality: N/A;  left partial mastectomy with excision of margins  . re-excision of left breast cancer on 09/10/12      There were no vitals filed for this visit.   Subjective Assessment - 10/19/20 1046    Subjective No paiin . doing better. Using the brace    Currently in Pain? No/denies                             [facility] Adult PT Treatment/Exercise - 10/19/20 0001      Neuro Re-ed    Neuro Re-ed Details  SLS LT 30 sec RT 3 reps  at 15 sec, 25 sec , 30 sec  less steady oin Rt.  Knee/Hip Exercises: Aerobic   Nustep --      Ankle Exercises: Stretches   Slant Board Stretch 2 reps;30 seconds    Other Stretch knee to wall DF RLE x10      Ankle Exercises: Standing   Heel Raises Both   25 reps   Braiding (Round Trip) 20 feet x 2 RT and LT     Other Standing Ankle Exercises side steps x 20 feet x 2 RT anbd Lt    Other Standing Ankle Exercises hip hinge for balance  x 10 RT /LT ,  Then staic lunges RT and LT x 10      Ankle Exercises: Aerobic   Nustep LE only 5 min L5                  PT Education - 10/19/20 1030    Education Details HEp    Person(s) Educated Patient    Methods Explanation;Tactile cues;Verbal  cues;Handout    Comprehension Verbalized understanding;Returned demonstration            PT Short Term Goals - 10/19/20 1044      PT SHORT TERM GOAL #4   Title Pt will be able to tolerate SLS for at least 20 sec    Baseline 30 sec on 3rd attempt    Status Achieved             PT Long Term Goals - 10/16/20 1038      PT LONG TERM GOAL #1   Title Pt will be independent with advanced HEP    Status On-going      PT LONG TERM GOAL #2   Title Pt will have full ankle ROM to be able to safely negotiate steps    Baseline continues to report feeling of heaviness in R LE when ascending stairs    Status On-going      PT LONG TERM GOAL #3   Title Pt will demonstrate at least 4+/5 ankle strength for improved stability    Baseline 5/5 DF, INV, EV; not PF    Status On-going      PT LONG TERM GOAL #4   Title Pt will be able to return to community exercise program    Baseline not yet returned    Status On-going      PT LONG TERM GOAL #5   Title Pt will be able to amb with normal reciprocal gait pattern for community ambulation    Baseline decr stance time R LE and shortened step lengths/heights    Status On-going                 Plan - 10/19/20 0957    Clinical Impression Statement No pain to start.  Using ASO now., No limp noted. she is able to walk on her heels but not on her toes. able to stand longer on Rt leg still less steady on RT.    PT Treatment/Interventions ADLs/Self Care Home Management;Aquatic Therapy;Cryotherapy;Electrical Stimulation;Iontophoresis 93m/ml Dexamethasone;Moist Heat;Ultrasound;Gait training;Stair training;Functional mobility training;Therapeutic activities;Therapeutic exercise;Balance training;Neuromuscular re-education;Patient/family education;Orthotic Fit/Training;Manual techniques;Scar mobilization;Dry needling;Taping;Vasopneumatic Device    PT Next Visit Plan . Continue ankle stretching/strengthening     manual      Work on weightbearing and gait  training. Attempt stair training.  ASO and shoe    PT Home Exercise Plan Access Code: [email or web address], PF stretch, PF resistance band , braid, side sep, SLS balance    Consulted and Agree with Plan of Care Patient  Patient will benefit from skilled therapeutic intervention in order to improve the following deficits and impairments:  Abnormal gait, Decreased range of motion, Difficulty walking, Increased fascial restricitons, Decreased activity tolerance, Decreased balance, Hypomobility, Impaired flexibility, Decreased mobility, Decreased strength, Increased edema, Impaired sensation  Visit Diagnosis: Muscle weakness (generalized)  Stiffness of right ankle, not elsewhere classified  Localized edema     Problem List Patient Active Problem List   Diagnosis Date Noted  . Malignant neoplasm of upper-outer quadrant of left breast in female, estrogen receptor positive ([place]) 12/17/2013  . Mass of right axilla 05/02/2013  . Anemia in neoplastic disease 05/02/2013    Darrel Hoover  PT 10/19/2020, 10:47 AM  [facility] [address] [place], [place], [address] Phone: [phone]   Fax:  [phone]  Name: LASTACIA SOLUM MRN: [id number] Date of Birth: 11/29/1965   PHYSICAL THERAPY DISCHARGE SUMMARY  Visits from Start of Care: 7  Current functional level related to goals / functional outcomes: Mild gait impairment and stairs negotiation   Remaining deficits: Normalized gait and stairs negotiation   Education / Equipment: HEP, theraband  Plan: Patient agrees to discharge.  Patient goals were partially met. Patient is being discharged due to being pleased with the current functional level.  ?????        Phill Myron. Yvette Rack, PT, DPT

## 2020-10-29 ENCOUNTER — Ambulatory Visit: Payer: BLUE CROSS/BLUE SHIELD

## 2020-10-30 ENCOUNTER — Telehealth: Payer: Self-pay

## 2020-10-30 ENCOUNTER — Ambulatory Visit: Payer: BLUE CROSS/BLUE SHIELD

## 2020-10-30 NOTE — Telephone Encounter (Signed)
Spoke with pt who said she had things to do this morning and "I'm fine". She has no more scheduled visits and was near end of POC. Pt agreeable to discharge, but advised to obtain new Rx from doctor should she want to return.   Phill Myron. Yvette Rack, PT, DPT

## 2020-12-17 ENCOUNTER — Other Ambulatory Visit: Payer: Self-pay | Admitting: Obstetrics

## 2020-12-17 DIAGNOSIS — Z1231 Encounter for screening mammogram for malignant neoplasm of breast: Secondary | ICD-10-CM

## 2021-01-26 ENCOUNTER — Inpatient Hospital Stay (HOSPITAL_COMMUNITY): Admit: 2021-01-26 | Payer: BLUE CROSS/BLUE SHIELD

## 2021-01-26 ENCOUNTER — Other Ambulatory Visit: Payer: Self-pay

## 2021-01-26 ENCOUNTER — Encounter: Payer: Self-pay | Admitting: Obstetrics

## 2021-01-26 ENCOUNTER — Ambulatory Visit (INDEPENDENT_AMBULATORY_CARE_PROVIDER_SITE_OTHER): Payer: BLUE CROSS/BLUE SHIELD | Admitting: Obstetrics

## 2021-01-26 ENCOUNTER — Ambulatory Visit: Payer: BLUE CROSS/BLUE SHIELD | Admitting: Obstetrics

## 2021-01-26 VITALS — BP 115/78 | HR 59 | Ht 70.0 in | Wt 196.0 lb

## 2021-01-26 DIAGNOSIS — Z01419 Encounter for gynecological examination (general) (routine) without abnormal findings: Secondary | ICD-10-CM | POA: Diagnosis not present

## 2021-01-26 NOTE — Progress Notes (Addendum)
Subjective:        Sue [name] is a 55 y.o. female here for a routine exam.  Current complaints: None.    Personal health questionnaire:  Is patient Ashkenazi Jewish, have a family history of breast and/or ovarian cancer: yes Is there a family history of uterine cancer diagnosed at age < 77, gastrointestinal cancer, urinary tract cancer, family member who is a Field seismologist syndrome-associated carrier: no Is the patient overweight and hypertensive, family history of diabetes, personal history of gestational diabetes, preeclampsia or PCOS: no Is patient over 38, have PCOS,  family history of premature CHD under age 90, diabetes, smoke, have hypertension or peripheral artery disease:  no At any time, has a partner hit, kicked or otherwise hurt or frightened you?: no Over the past 2 weeks, have you felt down, depressed or hopeless?: no Over the past 2 weeks, have you felt little interest or pleasure in doing things?:no   Gynecologic History Patient's last menstrual period was 10/24/2018. Contraception: post menopausal status Last Pap: 01-24-2020. Results were: normal Last mammogram: 02-10-2020. Results were: normal  Obstetric History OB History  Gravida Para Term Preterm AB Living  2 2 1     1   SAB IAB Ectopic Multiple Live Births          1    # Outcome Date GA Lbr Len/2nd Weight Sex Delivery Anes PTL Lv  2 Term 01/06/06    F Vag-Spont   LIV  1 Para             Past Medical History:  Diagnosis Date  . Breast cancer ([place]) 08/28/12   Left Breast  . Right foot pain   . S/P radiation therapy 12/06/12 -01/23/13   Left Breast/Axilla / 46 Gy / 23 Fractions with a Boost to Left Breast / 14 Gy / 7 Fractions  . Use of tamoxifen (Nolvadex) march 2014  . Wears glasses     Past Surgical History:  Procedure Laterality Date  . ACHILLES TENDON SURGERY Right 06/02/2020   Procedure: ACHILLES LENGTHENING/KIDNER;  Surgeon: Erle Crocker, MD;  Location: [place];  Service:  Orthopedics;  Laterality: Right;  . BREAST LUMPECTOMY Left    takes tamoxifen  . CALCANEAL OSTEOTOMY Right 06/02/2020   Procedure: RIGHT LATERAL DISPLACEMENT CALCANEAL OSTEOTOMY, FLEXOR DIGITORUM LONGUS TRANSFER, SPRING LIGAMENT RECONSTRUCTION, POSTERIOR TIBIAL TENDON DEBRIDEMENT, DEEP ORTHOPEDIC HARDWARE REMOVAL, MEDIAL CUNEIFORM PLANTAR FLEXION OSTEOTOMY AND ACHILLES LENGTHENING, PARTIAL RESECTION OF NAVICULAR;  Surgeon: Erle Crocker, MD;  Location: [place];  Service: Orthopedics;  Ladean Raya  . DILATION AND CURETTAGE OF UTERUS     Following Miscarriage  . FOOT FUSION  3/09   ankle rt  . HARDWARE REMOVAL Right 06/02/2020   Procedure: HARDWARE REMOVAL;  Surgeon: Erle Crocker, MD;  Location: [place];  Service: Orthopedics;  Laterality: Right;  . Left Breast Lumpectomy  08/28/12  . 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 Microcalcifications Identified  . METATARSAL OSTEOTOMY Right 06/02/2020   Procedure: METATARSAL OSTEOTOMY;  Surgeon: Erle Crocker, MD;  Location: [place];  Service: Orthopedics;  Laterality: Right;  . RE-EXCISION OF BREAST CANCER,SUPERIOR MARGINS  10/30/2012   Procedure: RE-EXCISION OF BREAST CANCER,SUPERIOR MARGINS;  Surgeon: Adin Hector, MD;  [facility];  Service: General;  Laterality: N/A;  left partial mastectomy with excision of margins  . re-excision of left breast cancer on 09/10/12  No current outpatient medications on file. No Known Allergies  Social History   Tobacco Use  . Smoking status: Former Smoker    Types: Cigarettes    Quit date: 11/30/1993    Years since quitting: 27.1  . Smokeless tobacco: Never Used  Substance Use Topics  . Alcohol use: Not Currently    Comment: very seldom, 1x per week    Family History  Problem Relation Age of Onset  . Breast cancer Mother 15       blood cancer too  . Breast cancer Cousin         paternal cousin diagnosed; diagnosed in her late 50s  . Brain cancer Paternal Aunt        diagnosed in late 75s to early 18s      Review of Systems  Constitutional: negative for fatigue and weight loss Respiratory: negative for cough and wheezing Cardiovascular: negative for chest pain, fatigue and palpitations Gastrointestinal: negative for abdominal pain and change in bowel habits Musculoskeletal:negative for myalgias Neurological: negative for gait problems and tremors Behavioral/Psych: negative for abusive relationship, depression Endocrine: negative for temperature intolerance    Genitourinary:negative for abnormal menstrual periods, genital lesions, hot flashes, sexual problems and vaginal discharge Integument/breast: negative for breast lump, breast tenderness, nipple discharge and skin lesion(s)    Objective:       BP 115/78   Pulse (!) 59   Ht 5\' 10"  (1.778 m)   Wt 196 lb (88.9 kg)   LMP 10/24/2018   BMI 28.12 kg/m  General:   alert and no distress  Skin:   no rash or abnormalities  Lungs:   clear to auscultation bilaterally  Heart:   regular rate and rhythm, S1, S2 normal, no murmur, click, rub or gallop  Breasts:   normal without suspicious masses, skin or nipple changes or axillary nodes  Abdomen:  normal findings: no organomegaly, soft, non-tender and no hernia  Pelvis:  External genitalia: normal general appearance Urinary system: urethral meatus normal and bladder without fullness, nontender Vaginal: normal without tenderness, induration or masses Cervix: normal appearance Adnexa: normal bimanual exam Uterus: anteverted and non-tender, normal size   Lab Review Urine pregnancy test Labs reviewed yes Radiologic studies reviewed yes  50% of 20 min visit spent on counseling and coordination of care.   Assessment:     1. Encounter for routine gynecological examination with Papanicolaou smear of cervix Rx: - Cytology - PAP   Plan:    Education  reviewed: calcium supplements, depression evaluation, low fat, low cholesterol diet, safe sex/STD prevention, self breast exams and weight bearing exercise. Follow up in: 1 year.     Shelly Bombard, MD 01/26/2021 10:18 AM

## 2021-01-26 NOTE — Progress Notes (Signed)
Patient presents for Annual Exam.  Last Pap: 01/24/2020 WNL  Mammogram:02/10/2020 WNL Scheduled for 02/2021. Family Hx of Breast Cancer: Mother  Colonoscopy: N/A  Bone Density: 04/03/20  Pt declines cervical swab today.   CC: None

## 2021-01-28 LAB — CYTOLOGY - PAP
Comment: NEGATIVE
Diagnosis: NEGATIVE
High risk HPV: NEGATIVE

## 2021-02-18 ENCOUNTER — Ambulatory Visit
Admission: RE | Admit: 2021-02-18 | Discharge: 2021-02-18 | Disposition: A | Discharge status: Home / Self Care | Payer: Medicaid Other | Source: Ambulatory Visit | Attending: Obstetrics | Admitting: Obstetrics

## 2021-02-18 ENCOUNTER — Other Ambulatory Visit: Payer: Self-pay

## 2021-02-18 DIAGNOSIS — Z1231 Encounter for screening mammogram for malignant neoplasm of breast: Secondary | ICD-10-CM

## 2021-03-31 ENCOUNTER — Encounter: Payer: Self-pay | Admitting: Obstetrics

## 2021-06-24 ENCOUNTER — Ambulatory Visit: Payer: Medicaid Other | Admitting: Obstetrics

## 2022-01-14 ENCOUNTER — Other Ambulatory Visit: Payer: Self-pay | Admitting: Obstetrics

## 2022-01-14 DIAGNOSIS — Z1231 Encounter for screening mammogram for malignant neoplasm of breast: Secondary | ICD-10-CM

## 2022-02-21 ENCOUNTER — Ambulatory Visit
Admission: RE | Admit: 2022-02-21 | Discharge: 2022-02-21 | Disposition: A | Discharge status: Home / Self Care | Payer: Medicaid Other | Source: Ambulatory Visit | Attending: Obstetrics | Admitting: Obstetrics

## 2022-02-21 DIAGNOSIS — Z1231 Encounter for screening mammogram for malignant neoplasm of breast: Secondary | ICD-10-CM

## 2022-02-24 NOTE — Progress Notes (Signed)
?Subjective:  ? ? STORY CONTI - 56 y.o. female MRN [id number]  Date of birth: [birth year] ? ?HPI ? ?Sue [name] is to establish care. ? ?Current issues and/or concerns: ?HEMORRHOIDS: ?Reports internal hemorrhoids since college. Never treated in the past. Reports no significant issues over the course of the years. Generally well controlled using over-the-counter Preparation H.  Flared during pregnancy and then returned to baseline after delivery. Recently internal hemorrhoids came out. Usually pushes them back in but did not do so. Reports now rectum tight. Denies blood in stool, mucus in stool and additional red flag symptoms. Requesting referral to Gastroenterology for colon cancer screening.  ? ?2. FINGER CONCERN: ?Reports 2 weeks ago was biting cuticles of left hand middle finger using teeth. Subsequently scratched the same finger using her fingernails. Noticed swelling. Has improved some since then. Noticed drainage of pus for 1 or 2 days then resolved. Using over-the-counter Neosporin and soaking in Peroxide. 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 Reports she does use good hand hygiene but thinks she may have missed hand hygiene on one occasion unknowingly which caused symptoms to arise.  ? ?3. PIMPLES: ?Reports two pimples on mid-back. Reports pops them to release drainage and feels better. Thinks pimples are irritated by sweating. Requesting referral to Dermatology.  ? ? ?ROS per HPI  ? ? ?Health Maintenance:  ?Health Maintenance Due  ?Topic Date Due  ? COVID-19 Vaccine (1) Never done  ? HIV Screening  Never done  ? Hepatitis C Screening  Never done  ? TETANUS/TDAP  Never done  ? Zoster Vaccines- Shingrix (1 of 2) Never done  ? COLONOSCOPY (Pts 45-41yr Insurance coverage will need to be confirmed)  Never done  ? ? ? ?Past Medical History: ?Patient Active Problem List  ? Diagnosis Date Noted  ? Malignant neoplasm of upper-outer quadrant of left breast in female, estrogen receptor  positive [place] 12/17/2013  ? Mass of right axilla 05/02/2013  ? Anemia in neoplastic disease 05/02/2013  ? ? ?Social History  ? reports that she quit smoking about 28 years ago. Her smoking use included cigarettes. She has never used smokeless tobacco. She reports that she does not currently use alcohol. She reports that she does not use drugs.  ? ?Family History  ?family history includes Brain cancer in her paternal aunt; Breast cancer in her cousin; Breast cancer (age of onset: 573 in her mother.  ? ?Medications: reviewed and updated ?  ?Objective:  ? Physical Exam ?BP 111/74 (BP Location: Left Arm, Patient Position: Sitting, Cuff Size: Normal)   Pulse 84   Temp 98.6 ?F (37 ?C)   Resp 18   Ht 5' 10.04" (1.779 m)   Wt 178 lb (80.7 kg)   LMP 10/24/2018   SpO2 96%   BMI 25.51 kg/m?  ? ?Physical Exam ?HENT:  ?   Head: Normocephalic and atraumatic.  ?Eyes:  ?   Extraocular Movements: Extraocular movements intact.  ?   Conjunctiva/sclera: Conjunctivae normal.  ?   Pupils: Pupils are equal, round, and reactive to light.  ?Cardiovascular:  ?   Rate and Rhythm: Normal rate and regular rhythm.  ?   Pulses: Normal pulses.  ?   Heart sounds: Normal heart sounds.  ?Pulmonary:  ?   Effort: Pulmonary effort is normal.  ?   Breath sounds: Normal breath sounds.  ?Musculoskeletal:  ?   Cervical back: Normal range of motion and neck supple.  ?   Comments: Left hand middle  finger distal phalange with edema, fluid collection, and hyperpigmented. No evidence of drainage.  ?Skin: ?   Comments: Two firm nodules mid-back. No evidence of drainage or erythema.  ?Neurological:  ?   General: No focal deficit present.  ?   Mental Status: She is alert and oriented to person, place, and time.  ?Psychiatric:     ?   Mood and Affect: Mood normal.     ?   Behavior: Behavior normal.  ? ?   ?Assessment & Plan:  ?1. Encounter to establish care: ?- Patient presents today to establish care.  ?- Return for annual physical examination, labs, and  health maintenance. Arrive fasting meaning having no food for at least 8 hours prior to appointment. You may have only water or black coffee. Please take scheduled medications as normal. ? ?2. Colon cancer screening: ?3. Hemorrhoids, unspecified hemorrhoid type: ?- Referral to Gastroenterology for colon cancer screening by colonoscopy. ?- Ambulatory referral to Gastroenterology ? ?4. Finger infection: ?- Amoxicillin-Clavulanate as prescribed. Counseled on medication adherence and adverse effects. ?- Diagnostic xray left hand for further evaluation. ?- Referral to Orthopedic Surgery for further evaluation and management.  ?- Discussed with patient going to Emergency Department for possible incision and drainage. Patient reports will think about going later.  ?- DG Hand Complete Left; Future ?- Ambulatory referral to Orthopedic Surgery ?- amoxicillin-clavulanate (AUGMENTIN) 875-125 MG tablet; Take 1 tablet by mouth 2 (two) times daily for 10 days.  Dispense: 20 tablet; Refill: 0 ? ?5. Pimples: ?- Referral to Dermatology for further evaluation and management.  ?- Ambulatory referral to Dermatology ? ? ? ? ?Patient was given clear instructions to go to Emergency Department or return to medical center if symptoms don't improve, worsen, or new problems develop.The patient verbalized understanding. ? ?I discussed the assessment and treatment plan with the patient. The patient was provided an opportunity to ask questions and all were answered. The patient agreed with the plan and demonstrated an understanding of the instructions. ?  ?The patient was advised to call back or seek an in-person evaluation if the symptoms worsen or if the condition fails to improve as anticipated. ? ? ? ?Durene Fruits, NP ?03/02/2022, 2:13 PM ?Primary Care at [facility]  ? ?

## 2022-03-02 ENCOUNTER — Ambulatory Visit (INDEPENDENT_AMBULATORY_CARE_PROVIDER_SITE_OTHER): Payer: Medicaid Other

## 2022-03-02 ENCOUNTER — Encounter: Payer: Self-pay | Admitting: Family

## 2022-03-02 ENCOUNTER — Ambulatory Visit (INDEPENDENT_AMBULATORY_CARE_PROVIDER_SITE_OTHER): Payer: Medicaid Other | Admitting: Family

## 2022-03-02 VITALS — BP 111/74 | HR 84 | Temp 98.6°F | Resp 18 | Ht 70.04 in | Wt 178.0 lb

## 2022-03-02 DIAGNOSIS — Z130 Encounter for screening for diseases of the blood and blood-forming organs and certain disorders involving the immune mechanism: Secondary | ICD-10-CM

## 2022-03-02 DIAGNOSIS — R238 Other skin changes: Secondary | ICD-10-CM | POA: Diagnosis not present

## 2022-03-02 DIAGNOSIS — K649 Unspecified hemorrhoids: Secondary | ICD-10-CM

## 2022-03-02 DIAGNOSIS — L089 Local infection of the skin and subcutaneous tissue, unspecified: Secondary | ICD-10-CM

## 2022-03-02 DIAGNOSIS — Z13228 Encounter for screening for other metabolic disorders: Secondary | ICD-10-CM

## 2022-03-02 DIAGNOSIS — Z131 Encounter for screening for diabetes mellitus: Secondary | ICD-10-CM

## 2022-03-02 DIAGNOSIS — Z0000 Encounter for general adult medical examination without abnormal findings: Secondary | ICD-10-CM

## 2022-03-02 DIAGNOSIS — M7989 Other specified soft tissue disorders: Secondary | ICD-10-CM | POA: Diagnosis not present

## 2022-03-02 DIAGNOSIS — Z1329 Encounter for screening for other suspected endocrine disorder: Secondary | ICD-10-CM

## 2022-03-02 DIAGNOSIS — Z13220 Encounter for screening for lipoid disorders: Secondary | ICD-10-CM

## 2022-03-02 DIAGNOSIS — Z1211 Encounter for screening for malignant neoplasm of colon: Secondary | ICD-10-CM

## 2022-03-02 DIAGNOSIS — Z7689 Persons encountering health services in other specified circumstances: Secondary | ICD-10-CM

## 2022-03-02 DIAGNOSIS — Z114 Encounter for screening for human immunodeficiency virus [HIV]: Secondary | ICD-10-CM

## 2022-03-02 DIAGNOSIS — Z1159 Encounter for screening for other viral diseases: Secondary | ICD-10-CM

## 2022-03-02 MED ORDER — AMOXICILLIN-POT CLAVULANATE 875-125 MG PO TABS: 1.0000 | ORAL_TABLET | Freq: Two times a day (BID) | ORAL | 0 refills | Status: AC

## 2022-03-02 NOTE — Progress Notes (Signed)
Pt presents to establish care ?Pt request referral to GI for colonoscopy due to internal bleeding hemorrhoids, ?Pt states on Sunday left middle finger in cuticle area had pus oozing out, and some mild pain in area unsure of cause pt states maybe she cut it in that area ?Pt request dermatologist for back due to recurring pimple like bumps that she states she squeezes and has odor to it   ?

## 2022-03-02 NOTE — Patient Instructions (Signed)

## 2022-03-04 NOTE — Progress Notes (Signed)
Left hand no fracture and no dislocation. Soft tissue swelling distal third finger. Continue with plan discussed in office.

## 2022-03-10 ENCOUNTER — Ambulatory Visit: Payer: Medicaid Other | Admitting: Orthopedic Surgery

## 2022-03-16 ENCOUNTER — Ambulatory Visit (INDEPENDENT_AMBULATORY_CARE_PROVIDER_SITE_OTHER): Payer: Medicaid Other | Admitting: Obstetrics

## 2022-03-16 ENCOUNTER — Encounter: Payer: Self-pay | Admitting: Obstetrics

## 2022-03-16 ENCOUNTER — Other Ambulatory Visit (HOSPITAL_COMMUNITY)
Admission: RE | Admit: 2022-03-16 | Discharge: 2022-03-16 | Disposition: A | Discharge status: Home / Self Care | Payer: Medicaid Other | Source: Ambulatory Visit | Attending: Obstetrics | Admitting: Obstetrics

## 2022-03-16 VITALS — BP 134/85 | HR 73 | Ht 70.0 in | Wt 183.2 lb

## 2022-03-16 DIAGNOSIS — Z01419 Encounter for gynecological examination (general) (routine) without abnormal findings: Secondary | ICD-10-CM | POA: Diagnosis not present

## 2022-03-16 DIAGNOSIS — N898 Other specified noninflammatory disorders of vagina: Secondary | ICD-10-CM

## 2022-03-16 DIAGNOSIS — Z1211 Encounter for screening for malignant neoplasm of colon: Secondary | ICD-10-CM

## 2022-03-16 NOTE — Progress Notes (Signed)
? ?Subjective: ? ? ?  ?  ? Sue [name] is a 56 y.o. female here for a routine exam.  Current complaints: Occasional hot flashes.   ? ?Personal health questionnaire:  ?Is patient Ashkenazi Jewish, have a family history of breast and/or ovarian cancer: yes ?Is there a family history of uterine cancer diagnosed at age < 7, gastrointestinal cancer, urinary tract cancer, family member who is a Field seismologist syndrome-associated carrier: no ?Is the patient overweight and hypertensive, family history of diabetes, personal history of gestational diabetes, preeclampsia or PCOS: no ?Is patient over 39, have PCOS,  family history of premature CHD under age 59, diabetes, smoke, have hypertension or peripheral artery disease:  no ?At any time, has a partner hit, kicked or otherwise hurt or frightened you?: no ?Over the past 2 weeks, have you felt down, depressed or hopeless?: no ?Over the past 2 weeks, have you felt little interest or pleasure in doing things?:no ? ? ?Gynecologic History ?Patient's last menstrual period was 10/24/2018. ?Contraception: post menopausal status ?Last Pap: 2022. Results were: normal ?Last mammogram: 2023. Results were: normal ? ?Obstetric History ?OB History  ?Gravida Para Term Preterm AB Living  ?'5 1 1   4 1  '$ ?SAB IAB Ectopic Multiple Live Births  ?'1 3     1  '$ ?  ?# Outcome Date GA Lbr Len/2nd Weight Sex Delivery Anes PTL Lv  ?5 Term 01/06/06    F Vag-Spont   LIV  ?4 IAB           ?3 IAB           ?2 IAB           ?1 SAB           ? ? ?Past Medical History:  ?Diagnosis Date  ? Breast cancer ([place]) 08/28/12  ? Left Breast  ? Right foot pain   ? S/P radiation therapy 12/06/12 -01/23/13  ? Left Breast/Axilla / 46 Gy / 23 Fractions with a Boost to Left Breast / 14 Gy / 7 Fractions  ? Use of tamoxifen (Nolvadex) march 2014  ? Wears glasses   ?  ?Past Surgical History:  ?Procedure Laterality Date  ? ACHILLES TENDON SURGERY Right 06/02/2020  ? Procedure: ACHILLES LENGTHENING/KIDNER;  Surgeon: Erle Crocker,  MD;  Location: [place];  Service: Orthopedics;  Laterality: Right;  ? BREAST LUMPECTOMY Left   ? takes tamoxifen  ? CALCANEAL OSTEOTOMY Right 06/02/2020  ? Procedure: RIGHT LATERAL DISPLACEMENT CALCANEAL OSTEOTOMY, FLEXOR DIGITORUM LONGUS TRANSFER, SPRING LIGAMENT RECONSTRUCTION, POSTERIOR TIBIAL TENDON DEBRIDEMENT, DEEP ORTHOPEDIC HARDWARE REMOVAL, MEDIAL CUNEIFORM PLANTAR FLEXION OSTEOTOMY AND ACHILLES LENGTHENING, PARTIAL RESECTION OF NAVICULAR;  Surgeon: Erle Crocker, MD;  Location: [place];  Service: Orthopedics;  Ladean Raya  ? DILATION AND CURETTAGE OF UTERUS    ? Following Miscarriage  ? FOOT FUSION  3/09  ? ankle rt  ? HARDWARE REMOVAL Right 06/02/2020  ? Procedure: HARDWARE REMOVAL;  Surgeon: Erle Crocker, MD;  Location: [facility];  Service: Orthopedics;  Laterality: Right;  ? Left Breast Lumpectomy  08/28/12  ? Left Breast Needle Core Biopsy  07/26/12  ? 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 Microcalcifications Identified  ? METATARSAL OSTEOTOMY Right 06/02/2020  ? Procedure: METATARSAL OSTEOTOMY;  Surgeon: Erle Crocker, MD;  Location: [place];  Service: Orthopedics;  Laterality: Right;  ? RE-EXCISION OF BREAST CANCER,SUPERIOR MARGINS  10/30/2012  ? Procedure: RE-EXCISION OF BREAST CANCER,SUPERIOR  MARGINS;  Surgeon: Adin Hector, MD;  Location: [facility];  Service: General;  Laterality: N/A;  left partial mastectomy with excision of margins  ? re-excision of left breast cancer on 09/10/12    ?  ?No current outpatient medications on file. ?No Known Allergies  ?Social History  ? ?Tobacco Use  ? Smoking status: Former  ?  Types: Cigarettes  ?  Quit date: 11/30/1993  ?  Years since quitting: 28.3  ? Smokeless tobacco: Never  ?Substance Use Topics  ? Alcohol use: Yes  ?  Comment: 1x per week  ?  ?Family History  ?Problem Relation Age of Onset  ? Breast cancer Mother 90  ?     blood cancer too  ? Breast cancer Cousin    ?     paternal cousin diagnosed; diagnosed in her late 62s  ? Brain cancer Paternal Aunt   ?     diagnosed in late 22s to early 50s  ?  ? ? ?Review of Systems ? ?Constitutional: negative for fatigue and weight loss ?Respiratory: negative for cough and wheezing ?Cardiovascular: negative for chest pain, fatigue and palpitations ?Gastrointestinal: negative for abdominal pain and change in bowel habits ?Musculoskeletal:negative for myalgias ?Neurological: negative for gait problems and tremors ?Behavioral/Psych: negative for abusive relationship, depression ?Endocrine: negative for temperature intolerance    ?Genitourinary: positive for hot flashes.  negative for abnormal menstrual periods, genital lesions, sexual problems and vaginal discharge ?Integument/breast: negative for breast lump, breast tenderness, nipple discharge and skin lesion(s) ? ?  ?Objective:  ? ?    ?BP 134/85   Pulse 73   Ht '5\' 10"'$  (1.778 m)   Wt 183 lb 3.2 oz (83.1 kg)   LMP 10/24/2018   BMI 26.29 kg/m?  ?General:   Alert and no distress  ?Skin:   no rash or abnormalities  ?Lungs:   clear to auscultation bilaterally  ?Heart:   regular rate and rhythm, S1, S2 normal, no murmur, click, rub or gallop  ?Breasts:   normal without suspicious masses, skin or nipple changes or axillary nodes  ?Abdomen:  normal findings: no organomegaly, soft, non-tender and no hernia  ?Pelvis:  External genitalia: normal general appearance ?Urinary system: urethral meatus normal and bladder without fullness, nontender ?Vaginal: normal without tenderness, induration or masses ?Cervix: normal appearance ?Adnexa: normal bimanual exam ?Uterus: anteverted and non-tender, normal size  ? ?Lab Review ?Urine pregnancy test ?Labs reviewed yes ?Radiologic studies reviewed yes ? ?I have spent a total of 20 minutes of face-to-face time, excluding clinical staff time, reviewing notes and preparing to see patient, ordering tests and/or medications, and counseling the patient.   ? ?Assessment:  ? ? 1. Encounter for gynecological examination with Papanicolaou smear of cervix ?Rx: ?- Cytology - PAP( [facility]) ? ?2. Screening for colon cancer ?Rx: ?- Ambulatory referral to Gastroenterology  ?  ? ?Plan:  ? ? Education reviewed: calcium supplements, depression evaluation, low fat, low cholesterol diet, safe sex/STD prevention, self breast exams, and weight bearing exercise. ?Follow up in: 1 year.  ? ? ?Orders Placed This Encounter  ?Procedures  ? Ambulatory referral to Gastroenterology  ?  Referral Priority:   Routine  ?  Referral Type:   Consultation  ?  Referral Reason:   Specialty Services Required  ?  Number of Visits Requested:   1  ? ? ? Shelly Bombard, MD ?03/16/2022 10:56 AM  ?

## 2022-03-16 NOTE — Progress Notes (Signed)
Patient presents for AEX. Patient denies having any vaginal discharge, odor, or irritation. Declines STD testing, yeast, and bv. ? ?Last pap: 01/26/21 Normal  ?Last MM: 02/2022 Normal ?

## 2022-03-17 LAB — CYTOLOGY - PAP
Comment: NEGATIVE
Diagnosis: NEGATIVE
High risk HPV: NEGATIVE

## 2022-03-21 ENCOUNTER — Ambulatory Visit: Payer: Medicaid Other | Admitting: Orthopedic Surgery

## 2022-04-18 ENCOUNTER — Encounter: Payer: Self-pay | Admitting: Internal Medicine

## 2022-05-05 ENCOUNTER — Ambulatory Visit (AMBULATORY_SURGERY_CENTER): Payer: Self-pay | Admitting: *Deleted

## 2022-05-05 VITALS — Ht 70.0 in | Wt 171.0 lb

## 2022-05-05 DIAGNOSIS — Z1211 Encounter for screening for malignant neoplasm of colon: Secondary | ICD-10-CM

## 2022-05-05 MED ORDER — NA SULFATE-K SULFATE-MG SULF 17.5-3.13-1.6 GM/177ML PO SOLN: 1.0000 | Freq: Once | ORAL | 0 refills | Status: AC

## 2022-05-05 NOTE — Progress Notes (Signed)
No egg or soy allergy known to patient  No issues known to pt with past sedation with any surgeries or procedures Patient denies ever being told they had issues or difficulty with intubation  No FH of Malignant Hyperthermia Pt is not on diet pills Pt is not on  home 02  Pt is not on blood thinners  Pt states  issues with constipation - no meds - increases water  and veggies and walking helps as well  No A fib or A flutter

## 2022-05-11 DIAGNOSIS — D229 Melanocytic nevi, unspecified: Secondary | ICD-10-CM | POA: Diagnosis not present

## 2022-05-11 DIAGNOSIS — L720 Epidermal cyst: Secondary | ICD-10-CM | POA: Diagnosis not present

## 2022-05-24 ENCOUNTER — Encounter: Payer: Self-pay | Admitting: Internal Medicine

## 2022-05-26 ENCOUNTER — Ambulatory Visit (AMBULATORY_SURGERY_CENTER): Payer: Medicaid Other | Admitting: Internal Medicine

## 2022-05-26 ENCOUNTER — Encounter: Payer: Self-pay | Admitting: Internal Medicine

## 2022-05-26 VITALS — BP 131/80 | HR 57 | Temp 97.8°F | Resp 13 | Ht 70.0 in | Wt 171.0 lb

## 2022-05-26 DIAGNOSIS — D125 Benign neoplasm of sigmoid colon: Secondary | ICD-10-CM | POA: Diagnosis not present

## 2022-05-26 DIAGNOSIS — Z1211 Encounter for screening for malignant neoplasm of colon: Secondary | ICD-10-CM

## 2022-05-26 DIAGNOSIS — D122 Benign neoplasm of ascending colon: Secondary | ICD-10-CM

## 2022-05-26 DIAGNOSIS — D120 Benign neoplasm of cecum: Secondary | ICD-10-CM | POA: Diagnosis not present

## 2022-05-26 DIAGNOSIS — D123 Benign neoplasm of transverse colon: Secondary | ICD-10-CM | POA: Diagnosis not present

## 2022-05-26 MED ORDER — SODIUM CHLORIDE 0.9 % IV SOLN: 500.0000 mL | Freq: Once | INTRAVENOUS | Status: DC

## 2022-05-26 NOTE — Patient Instructions (Signed)
  Handouts provided about polyps and hemorrhoids.  Await pathology results.  YOU HAD AN ENDOSCOPIC PROCEDURE TODAY AT [facility]:   Refer to the procedure report that was given to you for any specific questions about what was found during the examination.  If the procedure report does not answer your questions, please call your gastroenterologist to clarify.  If you requested that your care partner not be given the details of your procedure findings, then the procedure report has been included in a sealed envelope for you to review at your convenience later.  YOU SHOULD EXPECT: Some feelings of bloating in the abdomen. Passage of more gas than usual.  Walking can help get rid of the air that was put into your GI tract during the procedure and reduce the bloating. If you had a lower endoscopy (such as a colonoscopy or flexible sigmoidoscopy) you may notice spotting of blood in your stool or on the toilet paper. If you underwent a bowel prep for your procedure, you may not have a normal bowel movement for a few days.  Please Note:  You might notice some irritation and congestion in your nose or some drainage.  This is from the oxygen used during your procedure.  There is no need for concern and it should clear up in a day or so.  SYMPTOMS TO REPORT IMMEDIATELY:  Following lower endoscopy (colonoscopy or flexible sigmoidoscopy):  Excessive amounts of blood in the stool  Significant tenderness or worsening of abdominal pains  Swelling of the abdomen that is new, acute  Fever of 100F or higher   For urgent or emergent issues, a gastroenterologist can be reached at any hour by calling [phone]. Do not use MyChart messaging for urgent concerns.    DIET:  We do recommend a small meal at first, but then you may proceed to your regular diet.  Drink plenty of fluids but you should avoid alcoholic beverages for 24 hours.  ACTIVITY:  You should plan to take it easy for the rest of  today and you should NOT DRIVE or use heavy machinery until tomorrow (because of the sedation medicines used during the test).    FOLLOW UP: Our staff will call the number listed on your records the next business day following your procedure.  We will call around 7:15- 8:00 am to check on you and address any questions or concerns that you may have regarding the information given to you following your procedure. If we do not reach you, we will leave a message.  If you develop any symptoms (ie: fever, flu-like symptoms, shortness of breath, cough etc.) before then, please call [phone].  If you test positive for Covid 19 in the 2 weeks post procedure, please call and report this information to [place].    If any biopsies were taken you will be contacted by phone or by letter within the next 1-3 weeks.  Please call us at [phone] if you have not heard about the biopsies in 3 weeks.    SIGNATURES/CONFIDENTIALITY: You and/or your care partner have signed paperwork which will be entered into your electronic medical record.  These signatures attest to the fact that that the information above on your After Visit Summary has been reviewed and is understood.  Full responsibility of the confidentiality of this discharge information lies with you and/or your care-partner.

## 2022-05-26 NOTE — Progress Notes (Signed)
Called to room to assist during endoscopic procedure.  Patient ID and intended procedure confirmed with present staff. Received instructions for my participation in the procedure from the performing physician.  

## 2022-05-26 NOTE — Progress Notes (Signed)
Vss nad trans to pacu °

## 2022-05-26 NOTE — Progress Notes (Signed)
Pt's states no medical or surgical changes since previsit or office visit. 

## 2022-05-26 NOTE — Op Note (Signed)
Sue [name] Patient Name: Sue [name] Procedure Date: 05/26/2022 10:47 AM MRN: [id number] Endoscopist: Sonny Masters "Sue [name] ,  Age: 56 Referring MD:  Date of Birth: 04-Oct-1966 Gender: Female Account #: [id number] Procedure:                Colonoscopy Indications:              Screening for colorectal malignant neoplasm, This                            is the patient's first colonoscopy Medicines:                Monitored Anesthesia Care Procedure:                Pre-Anesthesia Assessment:                           - Prior to the procedure, a History and Physical                            was performed, and patient medications and                            allergies were reviewed. The patient's tolerance of                            previous anesthesia was also reviewed. The risks                            and benefits of the procedure and the sedation                            options and risks were discussed with the patient.                            All questions were answered, and informed consent                            was obtained. Prior Anticoagulants: The patient has                            taken no previous anticoagulant or antiplatelet                            agents. ASA Grade Assessment: II - A patient with                            mild systemic disease. After reviewing the risks                            and benefits, the patient was deemed in                            satisfactory condition to undergo the procedure.  After obtaining informed consent, the colonoscope                            was passed under direct vision. Throughout the                            procedure, the patient's blood pressure, pulse, and                            oxygen saturations were monitored continuously. The                            Olympus CF-HQ190L ([id number]) Colonoscope was                            introduced through the  anus and advanced to the the                            terminal ileum. The colonoscopy was performed                            without difficulty. The patient tolerated the                            procedure well. The quality of the bowel                            preparation was good. The terminal ileum, ileocecal                            valve, appendiceal orifice, and rectum were                            photographed. Scope In: 10:51:31 AM Scope Out: 11:22:49 AM Scope Withdrawal Time: 0 hours 21 minutes 5 seconds  Total Procedure Duration: 0 hours 31 minutes 18 seconds  Findings:                 The terminal ileum appeared normal.                           Four sessile polyps were found in the ascending                            colon and cecum. The polyps were 2 to 4 mm in size.                            These polyps were removed with a cold snare.                            Resection and retrieval were complete.                           Two sessile polyps were found in the sigmoid colon.  The polyps were 4 to 7 mm in size. These polyps                            were removed with a cold snare. Resection and                            retrieval were complete.                           Non-bleeding internal hemorrhoids were found during                            retroflexion. Complications:            No immediate complications. Estimated Blood Loss:     Estimated blood loss was minimal. Impression:               - The examined portion of the ileum was normal.                           - Four 2 to 4 mm polyps in the ascending colon and                            in the cecum, removed with a cold snare. Resected                            and retrieved.                           - Two 4 to 7 mm polyps in the sigmoid colon,                            removed with a cold snare. Resected and retrieved.                           - Non-bleeding  internal hemorrhoids. Recommendation:           - Discharge patient to home (with escort).                           - Await pathology results.                           - The findings and recommendations were discussed                            with the patient. Sonny Masters "Sue [name],  05/26/2022 11:25:59 AM

## 2022-05-26 NOTE — Progress Notes (Signed)
GASTROENTEROLOGY PROCEDURE H&P NOTE   Primary Care Physician: Camillia Herter, NP    Reason for Procedure:   Colon cancer screening  Plan:    Colonoscopy  Patient is appropriate for endoscopic procedure(s) in the ambulatory ([facility]) setting.  The nature of the procedure, as well as the risks, benefits, and alternatives were carefully and thoroughly reviewed with the patient. Ample time for discussion and questions allowed. The patient understood, was satisfied, and agreed to proceed.     HPI: Sue [name] is a 56 y.o. female who presents for colonoscopy for colon cancer screening. Denies blood in the stools, changes in bowel habits, weight loss. Denies family history of colon cancer.  Past Medical History:  Diagnosis Date   Breast cancer ([place]) 08/28/2012   Left Breast   Hemorrhoids    Right foot pain    S/P radiation therapy 12/06/12 -01/23/13   Left Breast/Axilla / 46 Gy / 23 Fractions with a Boost to Left Breast / 14 Gy / 7 Fractions   Use of tamoxifen (Nolvadex) 01/2013   Wears glasses     Past Surgical History:  Procedure Laterality Date   ACHILLES TENDON SURGERY Right 06/02/2020   Procedure: ACHILLES LENGTHENING/KIDNER;  Surgeon: Erle Crocker, MD;  Location: [facility];  Service: Orthopedics;  Laterality: Right;   BREAST LUMPECTOMY Left    takes tamoxifen   CALCANEAL OSTEOTOMY Right 06/02/2020   Procedure: RIGHT LATERAL DISPLACEMENT CALCANEAL OSTEOTOMY, FLEXOR DIGITORUM LONGUS TRANSFER, SPRING LIGAMENT RECONSTRUCTION, POSTERIOR TIBIAL TENDON DEBRIDEMENT, DEEP ORTHOPEDIC HARDWARE REMOVAL, MEDIAL CUNEIFORM PLANTAR FLEXION OSTEOTOMY AND ACHILLES LENGTHENING, PARTIAL RESECTION OF NAVICULAR;  Surgeon: Erle Crocker, MD;  Location: [facility];  Service: Orthopedics;  Latera   DILATION AND CURETTAGE OF UTERUS     Following Miscarriage   FOOT FUSION  3/09   ankle rt   HARDWARE REMOVAL Right 06/02/2020   Procedure: HARDWARE  REMOVAL;  Surgeon: Erle Crocker, MD;  Location: [facility];  Service: Orthopedics;  Laterality: Right;   Left Breast Lumpectomy  08/28/12   Left Breast Needle Core Biopsy  07/26/12   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 Microcalcifications Identified   METATARSAL OSTEOTOMY Right 06/02/2020   Procedure: METATARSAL OSTEOTOMY;  Surgeon: Erle Crocker, MD;  Location: [facility];  Service: Orthopedics;  Laterality: Right;   RE-EXCISION OF BREAST CANCER,SUPERIOR MARGINS  10/30/2012   Procedure: RE-EXCISION OF BREAST CANCER,SUPERIOR MARGINS;  Surgeon: Adin Hector, MD;  Location: [facility];  Service: General;  Laterality: N/A;  left partial mastectomy with excision of margins   re-excision of left breast cancer on 09/10/12      Prior to Admission medications   Not on File    No current outpatient medications on file.   Current Facility-Administered Medications  Medication Dose Route Frequency Provider Last Rate Last Admin   0.9 %  sodium chloride infusion  500 mL Intravenous Once Sharyn Creamer, MD        Allergies as of 05/26/2022   (No Known Allergies)    Family History  Problem Relation Age of Onset   Breast cancer Mother 31       blood cancer too   Brain cancer Paternal Aunt        diagnosed in late 39s to early 83s   Breast cancer Cousin        paternal cousin diagnosed; diagnosed in her late 50s   Colon cancer Neg Hx  Colon polyps Neg Hx    Esophageal cancer Neg Hx    Rectal cancer Neg Hx    Stomach cancer Neg Hx     Social History   Socioeconomic History   Marital status: Married    Spouse name: Not on file   Number of children: 1   Years of education: Not on file   Highest education level: Bachelor's degree (e.g., BA, AB, BS)  Occupational History   Not on file  Tobacco Use   Smoking status: Former    Types: Cigarettes    Quit date: 11/30/1993    Years since quitting: 28.5   Smokeless tobacco: Never   Vaping Use   Vaping Use: Never used  Substance and Sexual Activity   Alcohol use: Yes    Comment: 1x per week   Drug use: No   Sexual activity: Yes    Partners: Male    Birth control/protection: Post-menopausal  Other Topics Concern   Not on file  Social History Narrative   Not on file   Social Determinants of Health   Financial Resource Strain: Not on file  Food Insecurity: Not on file  Transportation Needs: No Transportation Needs (01/10/2019)   PRAPARE - Transportation    Lack of Transportation (Medical): No    Lack of Transportation (Non-Medical): No  Physical Activity: Not on file  Stress: Not on file  Social Connections: Not on file  Intimate Partner Violence: Not on file    Physical Exam: Vital signs in last 24 hours: BP 116/86   Pulse (!) 58   Temp 97.8 F (36.6 C) (Temporal)   Ht '5\' 10"'$  (1.778 m)   Wt 171 lb (77.6 kg)   LMP 10/24/2018   SpO2 99%   BMI 24.54 kg/m  GEN: NAD EYE: Sclerae anicteric ENT: MMM CV: Non-tachycardic Pulm: No increased work of breathing GI: Soft, NT/ND NEURO:  Alert & Oriented   [name], MD [facility]  05/26/2022 10:37 AM

## 2022-05-27 ENCOUNTER — Telehealth: Payer: Self-pay

## 2022-05-27 NOTE — Telephone Encounter (Signed)
Left message on follow up call. 

## 2022-05-30 ENCOUNTER — Encounter: Payer: Self-pay | Admitting: Internal Medicine

## 2022-05-30 DIAGNOSIS — L720 Epidermal cyst: Secondary | ICD-10-CM | POA: Diagnosis not present

## 2022-12-12 NOTE — Progress Notes (Deleted)
Patient ID: Sue [name], female    DOB: [birth year]  MRN: [id number]  CC: Rash   Subjective: Sue [name] is a 57 y.o. female who presents for rash.   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   No current facility-administered medications on file prior to visit.    No Known Allergies  Social History   Socioeconomic History   Marital status: Married    Spouse name: Not on file   Number of children: 1   Years of education: Not on file   Highest education level: Bachelor's degree (e.g., BA, AB, BS)  Occupational History   Not on file  Tobacco Use   Smoking status: Former    Types: Cigarettes    Quit date: 11/30/1993    Years since quitting: 29.0   Smokeless tobacco: Never  Vaping Use   Vaping Use: Never used  Substance and Sexual Activity   Alcohol use: Yes    Comment: 1x per week   Drug use: No   Sexual activity: Yes    Partners: Male    Birth control/protection: Post-menopausal  Other Topics Concern   Not on file  Social History Narrative   Not on file   Social Determinants of Health   Financial Resource Strain: Not on file  Food Insecurity: Not on file  Transportation Needs: No Transportation Needs (01/10/2019)   PRAPARE - Transportation    Lack of Transportation (Medical): No    Lack of Transportation (Non-Medical): No  Physical Activity: Not on file  Stress: Not on file  Social Connections: Not on file  Intimate Partner Violence: Not on file    Family History  Problem Relation Age of Onset   Breast cancer Mother 82       blood cancer too   Brain  cancer Paternal Aunt        diagnosed in late 54s to early 48s   Breast cancer Cousin        paternal cousin diagnosed; diagnosed in her late 7s   Colon cancer Neg Hx    Colon polyps Neg Hx    Esophageal cancer Neg Hx    Rectal cancer Neg Hx    Stomach cancer Neg Hx     Past Surgical History:  Procedure Laterality Date   ACHILLES TENDON SURGERY Right 06/02/2020   Procedure: ACHILLES LENGTHENING/KIDNER;  Surgeon: Erle Crocker, MD;  Location: [facility];  Service: Orthopedics;  Laterality: Right;   BREAST LUMPECTOMY Left    takes tamoxifen   CALCANEAL OSTEOTOMY Right 06/02/2020   Procedure: RIGHT LATERAL DISPLACEMENT CALCANEAL OSTEOTOMY, FLEXOR DIGITORUM LONGUS TRANSFER, SPRING LIGAMENT RECONSTRUCTION, POSTERIOR TIBIAL TENDON DEBRIDEMENT, DEEP ORTHOPEDIC HARDWARE REMOVAL, MEDIAL CUNEIFORM PLANTAR FLEXION OSTEOTOMY AND ACHILLES LENGTHENING, PARTIAL RESECTION OF NAVICULAR;  Surgeon: Erle Crocker, MD;  Location: [facility];  Service: Orthopedics;  Latera   DILATION AND CURETTAGE OF UTERUS     Following Miscarriage   FOOT FUSION  3/09   ankle rt   HARDWARE REMOVAL Right 06/02/2020   Procedure: HARDWARE REMOVAL;  Surgeon: Erle Crocker,  MD;  Location: [facility];  Service: Orthopedics;  Laterality: Right;   Left Breast Lumpectomy  08/28/12   Left Breast Needle Core Biopsy  07/26/12   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 Microcalcifications Identified   METATARSAL OSTEOTOMY Right 06/02/2020   Procedure: METATARSAL OSTEOTOMY;  Surgeon: Erle Crocker, MD;  Location: [facility];  Service: Orthopedics;  Laterality: Right;   RE-EXCISION OF BREAST CANCER,SUPERIOR MARGINS  10/30/2012   Procedure: RE-EXCISION OF BREAST CANCER,SUPERIOR MARGINS;  Surgeon: Adin Hector, MD;  Location: [facility];  Service: General;  Laterality: N/A;  left partial mastectomy with excision of margins   re-excision of left breast  cancer on 09/10/12      ROS: Review of Systems Negative except as stated above  PHYSICAL EXAM: LMP 10/24/2018   Physical Exam  {female adult master:310786} {female adult master:310785}     Latest Ref Rng & Units 07/05/2018    1:10 PM 07/05/2017   11:29 AM 06/24/2017    4:57 PM  CMP  Glucose 70 - 99 mg/dL 106  62  94   BUN 6 - 20 mg/dL 15  16.5  16   Creatinine 0.44 - 1.00 mg/dL 0.74  0.8  0.85   Sodium 135 - 145 mmol/L 143  143  137   Potassium 3.5 - 5.1 mmol/L 3.5  3.7  3.8   Chloride 98 - 111 mmol/L 109   103   CO2 22 - 32 mmol/L '27  29  24   '$ Calcium 8.9 - 10.3 mg/dL 9.1  9.7  9.6   Total Protein 6.5 - 8.1 g/dL 6.8  7.6  7.5   Total Bilirubin 0.3 - 1.2 mg/dL <0.2  0.36  0.8   Alkaline Phos 38 - 126 U/L 78  70  66   AST 15 - 41 U/L '16  20  23   '$ ALT 0 - 44 U/L '12  12  12    '$ Lipid Panel  No results found for: "CHOL", "TRIG", "HDL", "CHOLHDL", "VLDL", "LDLCALC", "LDLDIRECT"  CBC    Component Value Date/Time   WBC 3.2 (L) 07/05/2018 1310   RBC 3.86 07/05/2018 1310   HGB 13.0 07/05/2018 1310   HGB 12.8 07/05/2017 1129   HCT 39.1 07/05/2018 1310   HCT 38.8 07/05/2017 1129   PLT 154 07/05/2018 1310   PLT 198 07/05/2017 1129   MCV 101.3 (H) 07/05/2018 1310   MCV 100.8 07/05/2017 1129   MCH 33.6 07/05/2018 1310   MCHC 33.2 07/05/2018 1310   RDW 14.0 07/05/2018 1310   RDW 14.1 07/05/2017 1129   LYMPHSABS 1.8 07/05/2018 1310   LYMPHSABS 1.9 07/05/2017 1129   MONOABS 0.3 07/05/2018 1310   MONOABS 0.4 07/05/2017 1129   EOSABS 0.0 07/05/2018 1310   EOSABS 0.0 07/05/2017 1129   BASOSABS 0.0 07/05/2018 1310   BASOSABS 0.0 07/05/2017 1129    ASSESSMENT AND PLAN:  There are no diagnoses linked to this encounter.   Patient was given the opportunity to ask questions.  Patient verbalized understanding of the plan and was able to repeat key elements of the plan. Patient was given clear instructions to go to Emergency Department or return to medical center if symptoms don't  improve, worsen, or new problems develop.The patient verbalized understanding.   No orders of the defined types were placed in this encounter.    Requested Prescriptions    No prescriptions requested or ordered in this encounter    No follow-ups on  file.  Camillia Herter, NP

## 2022-12-13 ENCOUNTER — Ambulatory Visit: Payer: Medicaid Other | Admitting: Family

## 2023-01-16 ENCOUNTER — Telehealth: Payer: Self-pay

## 2023-01-16 ENCOUNTER — Other Ambulatory Visit: Payer: Self-pay | Admitting: Obstetrics

## 2023-01-16 DIAGNOSIS — Z1231 Encounter for screening mammogram for malignant neoplasm of breast: Secondary | ICD-10-CM

## 2023-01-16 NOTE — Telephone Encounter (Signed)
Appt

## 2023-03-07 ENCOUNTER — Ambulatory Visit
Admission: RE | Admit: 2023-03-07 | Discharge: 2023-03-07 | Disposition: A | Discharge status: Home / Self Care | Payer: Medicaid Other | Source: Ambulatory Visit | Attending: Obstetrics | Admitting: Obstetrics

## 2023-03-07 DIAGNOSIS — Z1231 Encounter for screening mammogram for malignant neoplasm of breast: Secondary | ICD-10-CM

## 2023-03-07 HISTORY — DX: Personal history of irradiation: Z92.3

## 2023-08-03 ENCOUNTER — Ambulatory Visit: Payer: Self-pay | Admitting: Obstetrics and Gynecology

## 2023-10-10 ENCOUNTER — Ambulatory Visit (INDEPENDENT_AMBULATORY_CARE_PROVIDER_SITE_OTHER): Payer: Self-pay | Admitting: Family Medicine

## 2023-10-10 ENCOUNTER — Encounter: Payer: Self-pay | Admitting: Family Medicine

## 2023-10-10 VITALS — BP 161/105 | HR 76 | Ht 70.0 in | Wt 174.0 lb

## 2023-10-10 DIAGNOSIS — Z01419 Encounter for gynecological examination (general) (routine) without abnormal findings: Secondary | ICD-10-CM

## 2023-10-10 DIAGNOSIS — N898 Other specified noninflammatory disorders of vagina: Secondary | ICD-10-CM

## 2023-10-10 DIAGNOSIS — Z758 Other problems related to medical facilities and other health care: Secondary | ICD-10-CM

## 2023-10-10 DIAGNOSIS — Z1339 Encounter for screening examination for other mental health and behavioral disorders: Secondary | ICD-10-CM

## 2023-10-10 NOTE — Progress Notes (Signed)
ANNUAL EXAM Patient name: Sue [name] MRN [id number]  Date of birth: 04-Sep-1966 Chief Complaint:   Gynecologic Exam  History of Present Illness:   Sue [name] is a 57 y.o. [phone] African-American female being seen today for a routine annual exam.  Current complaints: None  Patient's last menstrual period was 10/24/2018.   The pregnancy intention screening data noted above was reviewed. Potential methods of contraception were discussed. The patient elected to proceed with No data recorded.   Last pap Mar 16, 2022. Results were: NILM w/ HRHPV negative. H/O abnormal pap: no Last mammogram: March 07, 2023. Results were: normal. Family h/o breast cancer: yes patient has history of breast cancer with lumpectomy Last colonoscopy: May 26, 2022. Results were: normal. Family h/o colorectal cancer: no     10/10/2023    2:10 PM 03/16/2022   10:34 AM 03/02/2022    1:09 PM 01/14/2019    8:22 AM 08/24/2018    8:06 AM  Depression screen PHQ 2/9  Decreased Interest 0 0 0 0 1  Down, Depressed, Hopeless 0 0 0 0 1  PHQ - 2 Score 0 0 0 0 2  Altered sleeping 0 0   3  Tired, decreased energy 0 1   1  Change in appetite 0 0   2  Feeling bad or failure about yourself  0 0   0  Trouble concentrating 0 0   0  Moving slowly or fidgety/restless 0 0   0  Suicidal thoughts 0 0   0  PHQ-9 Score 0 1   8        10/10/2023    2:12 PM 03/16/2022   10:35 AM  GAD 7 : Generalized Anxiety Score  Nervous, Anxious, on Edge 0 0  Control/stop worrying 1 0  Worry too much - different things 1 0  Trouble relaxing 0 0  Restless 0 0  Easily annoyed or irritable 0 0  Afraid - awful might happen 0 0  Total GAD 7 Score 2 0     Review of Systems:   Pertinent items are noted in HPI Denies any headaches, blurred vision, fatigue, shortness of breath, chest pain, abdominal pain, abnormal vaginal discharge/itching/odor/irritation, problems with periods, bowel movements, urination, or intercourse unless otherwise  stated above. Pertinent History Reviewed:  Reviewed past medical,surgical, social and family history.  Reviewed problem list, medications and allergies. Physical Assessment:   Vitals:   10/10/23 1400 10/10/23 1428  BP: (!) 153/98 (!) 161/105  Pulse: 76   Weight: 174 lb (78.9 kg)   Height: 5\' 10"  (1.778 m)   Body mass index is 24.97 kg/m.        Physical Examination:   General appearance - well appearing, and in no distress  Mental status - alert, oriented to person, place, and time  Psych:  She has a normal mood and affect  Skin - warm and dry, normal color, no suspicious lesions noted  Chest - effort normal, all lung fields clear to auscultation bilaterally  Heart - normal rate and regular rhythm  Neck:  midline trachea, no thyromegaly or nodules  Breasts - breasts appear normal, no suspicious masses, no skin or nipple changes or  palpation of scar tissue in left breast from lumpectomy  Abdomen - soft, nontender, nondistended, no masses or organomegaly  Pelvic - VULVA: normal appearing vulva with no masses, tenderness or lesions  VAGINA: normal appearing vagina with normal color and discharge, no lesions  CERVIX: normal appearing cervix  without discharge or lesions, no CMT  Wet prep collected today  Extremities:  No swelling or varicosities noted  Chaperone present for exam  No results found for this or any previous visit (from the past 24 hour(s)).  Assessment & Plan:  1) Well-Woman Exam  2) elevated blood pressure Patient with elevated blood pressure on today's visit.  Reports that she has not had a history of elevated blood pressure.  She does not have a PCP.  Patient reports that she feels like that elevated blood pressure is likely related to her recent diet.  Discussed that she might need treatment for this elevated blood pressure but patient not agreeable to treatment at this time.  Discussed the importance of establishing with a primary care doctor and will place referral  for this.  Labs/procedures today: Wet prep  Mammogram: Per breast center recommendations Colonoscopy:  05/26/2025 , or sooner if problems  Orders Placed This Encounter  Procedures   WET PREP FOR TRICH, YEAST, CLUE    Meds: No orders of the defined types were placed in this encounter.   Follow-up: No follow-ups on file.  Celedonio Savage, MD 10/10/2023 4:18 PM

## 2023-10-11 ENCOUNTER — Other Ambulatory Visit (HOSPITAL_COMMUNITY)
Admission: RE | Admit: 2023-10-11 | Discharge: 2023-10-11 | Disposition: A | Discharge status: Home / Self Care | Payer: Self-pay | Source: Ambulatory Visit | Attending: Family Medicine | Admitting: Family Medicine

## 2023-10-11 DIAGNOSIS — N898 Other specified noninflammatory disorders of vagina: Secondary | ICD-10-CM | POA: Diagnosis not present

## 2023-10-11 NOTE — Addendum Note (Signed)
Addended by: Jearld Adjutant on: 10/11/2023 11:44 AM   Modules accepted: Orders

## 2023-10-13 LAB — CERVICOVAGINAL ANCILLARY ONLY
Bacterial Vaginitis (gardnerella): NEGATIVE
Candida Glabrata: NEGATIVE
Candida Vaginitis: NEGATIVE
Chlamydia: NEGATIVE
Comment: NEGATIVE
Comment: NEGATIVE
Comment: NEGATIVE
Comment: NEGATIVE
Comment: NEGATIVE
Comment: NORMAL
Neisseria Gonorrhea: NEGATIVE
Trichomonas: NEGATIVE

## 2024-02-02 DIAGNOSIS — J310 Chronic rhinitis: Secondary | ICD-10-CM | POA: Diagnosis not present

## 2024-02-02 DIAGNOSIS — J069 Acute upper respiratory infection, unspecified: Secondary | ICD-10-CM | POA: Diagnosis not present

## 2024-02-19 ENCOUNTER — Other Ambulatory Visit: Payer: Self-pay | Admitting: Obstetrics

## 2024-02-19 DIAGNOSIS — Z0000 Encounter for general adult medical examination without abnormal findings: Secondary | ICD-10-CM

## 2024-03-13 ENCOUNTER — Ambulatory Visit
Admission: RE | Admit: 2024-03-13 | Discharge: 2024-03-13 | Disposition: A | Discharge status: Home / Self Care | Payer: Self-pay | Source: Ambulatory Visit | Attending: Obstetrics | Admitting: Obstetrics

## 2024-03-13 DIAGNOSIS — Z1231 Encounter for screening mammogram for malignant neoplasm of breast: Secondary | ICD-10-CM | POA: Diagnosis not present

## 2024-03-13 DIAGNOSIS — Z0000 Encounter for general adult medical examination without abnormal findings: Secondary | ICD-10-CM

## 2024-03-15 ENCOUNTER — Encounter: Payer: Self-pay | Admitting: Family Medicine

## 2024-05-20 ENCOUNTER — Ambulatory Visit
Admission: EM | Admit: 2024-05-20 | Discharge: 2024-05-20 | Disposition: A | Discharge status: Home / Self Care | Attending: Nurse Practitioner | Admitting: Nurse Practitioner

## 2024-05-20 ENCOUNTER — Encounter: Payer: Self-pay | Admitting: Emergency Medicine

## 2024-05-20 DIAGNOSIS — H9201 Otalgia, right ear: Secondary | ICD-10-CM

## 2024-05-20 DIAGNOSIS — H60501 Unspecified acute noninfective otitis externa, right ear: Secondary | ICD-10-CM | POA: Diagnosis not present

## 2024-05-20 MED ORDER — KETOROLAC TROMETHAMINE 30 MG/ML IJ SOLN
60.0000 mg | Freq: Once | INTRAMUSCULAR | Status: AC
  Administered 2024-05-20: 60 mg via INTRAMUSCULAR

## 2024-05-20 MED ORDER — IBUPROFEN 800 MG PO TABS: 800.0000 mg | ORAL_TABLET | Freq: Three times a day (TID) | ORAL | 0 refills | Status: DC | PRN

## 2024-05-20 MED ORDER — NEOMYCIN-POLYMYXIN-HC 1 % OT SOLN: 3.0000 [drp] | Freq: Four times a day (QID) | OTIC | 0 refills | Status: DC

## 2024-05-20 MED ORDER — AMOXICILLIN 875 MG PO TABS: 875.0000 mg | ORAL_TABLET | Freq: Two times a day (BID) | ORAL | 0 refills | Status: DC

## 2024-05-20 NOTE — Discharge Instructions (Addendum)
 You have been diagnosed with an ear infection and have been prescribed both an oral antibiotic and ear drops to help treat the infection and reduce inflammation and discomfort.   Take the oral antibiotic exactly as directed until it is finished, even if you start to feel better before it's gone. Stopping early can cause the infection to come back or become harder to treat.   Use the ear drops as prescribed. Before using them, gently clean the outer ear with a soft cloth if needed--do not insert anything deep into the ear. Warm the bottle slightly by holding it in your hands for a few minutes before using, as cold drops can cause dizziness. Tilt your head or lie on your side with the affected ear facing up. Place the prescribed number of drops into the ear, then stay in that position for a few minutes to allow the drops to soak in properly.   You may experience some mild discomfort or fullness in the ear while the infection is healing. You can take acetaminophen  or ibuprofen  if needed for pain, unless otherwise instructed. Avoid getting water in the affected ear while bathing or showering. Do not swim until the infection is fully resolved.   If your symptoms get worse, you develop fever, swelling behind the ear, or drainage from the ear, seek medical attention promptly. Follow up with your doctor as directed or if there's no improvement after a few days.

## 2024-05-20 NOTE — ED Provider Notes (Signed)
 [facility]    CSN: [id number] Arrival date & time: 05/20/24  1339      History   Chief Complaint Chief Complaint  Patient presents with   Otalgia    HPI Sue [name] is a 58 y.o. female.   Discussed the use of AI scribe software for clinical note transcription with the patient, who gave verbal consent to proceed.   Patient presents to with right ear pain that started yesterday and has worsened, accompanied by a headache and difficulty swallowing on the right side. The patient reports that her right ear began hurting yesterday. The ear, headache and throat pain is localized to the right side. he attempted to clean her ear with an antiseptic spray after the pain started, which seems to have exacerbated her symptoms. The patient has been taking Advil  with no pain relief.  The following portions of the patient's history were reviewed and updated as appropriate: allergies, current medications, past family history, past medical history, past social history, past surgical history, and problem list.    Past Medical History:  Diagnosis Date   Breast cancer (HCC) 08/28/2012   Left Breast   Hemorrhoids    Personal history of radiation therapy    Right foot pain    S/P radiation therapy 12/06/12 -01/23/13   Left Breast/Axilla / 46 Gy / 23 Fractions with a Boost to Left Breast / 14 Gy / 7 Fractions   Use of tamoxifen  (Nolvadex ) 01/2013   Wears glasses     Patient Active Problem List   Diagnosis Date Noted   Malignant neoplasm of upper-outer quadrant of left breast in female, estrogen receptor positive (HCC) 12/17/2013   Mass of right axilla 05/02/2013   Anemia in neoplastic disease 05/02/2013    Past Surgical History:  Procedure Laterality Date   ACHILLES TENDON SURGERY Right 06/02/2020   Procedure: ACHILLES LENGTHENING/KIDNER;  Surgeon: Elsa Lonni SAUNDERS, MD;  Location: [facility];  Service: Orthopedics;  Laterality: Right;   BREAST LUMPECTOMY  Left    takes tamoxifen    CALCANEAL OSTEOTOMY Right 06/02/2020   Procedure: RIGHT LATERAL DISPLACEMENT CALCANEAL OSTEOTOMY, FLEXOR DIGITORUM LONGUS TRANSFER, SPRING LIGAMENT RECONSTRUCTION, POSTERIOR TIBIAL TENDON DEBRIDEMENT, DEEP ORTHOPEDIC HARDWARE REMOVAL, MEDIAL CUNEIFORM PLANTAR FLEXION OSTEOTOMY AND ACHILLES LENGTHENING, PARTIAL RESECTION OF NAVICULAR;  Surgeon: Elsa Lonni SAUNDERS, MD;  Location: [facility];  Service: Orthopedics;  Latera   DILATION AND CURETTAGE OF UTERUS     Following Miscarriage   FOOT FUSION  3/09   ankle rt   HARDWARE REMOVAL Right 06/02/2020   Procedure: HARDWARE REMOVAL;  Surgeon: Elsa Lonni SAUNDERS, MD;  Location: [facility];  Service: Orthopedics;  Laterality: Right;   Left Breast Lumpectomy  08/28/12   Left Breast Needle Core Biopsy  07/26/12   UOQ - Ductal Carcinoma In Situ with Necrosis. Microcalcifications Identified   METATARSAL OSTEOTOMY Right 06/02/2020   Procedure: METATARSAL OSTEOTOMY;  Surgeon: Elsa Lonni SAUNDERS, MD;  Location: [facility];  Service: Orthopedics;  Laterality: Right;   RE-EXCISION OF BREAST CANCER,SUPERIOR MARGINS  10/30/2012   Procedure: RE-EXCISION OF BREAST CANCER,SUPERIOR MARGINS;  Surgeon: Elon CHRISTELLA Pacini, MD;  Location: [facility];  Service: General;  Laterality: N/A;  left partial mastectomy with excision of margins   re-excision of left breast cancer on 09/10/12      OB History     Gravida  5   Para  1   Term  1   Preterm  AB  4   Living  1      SAB  1   IAB  3   Ectopic      Multiple      Live Births  1            Home Medications    Prior to Admission medications   Medication Sig Start Date End Date Taking? Authorizing Provider  amoxicillin  (AMOXIL ) 875 MG tablet Take 1 tablet (875 mg total) by mouth 2 (two) times daily. 05/20/24  Yes Deshondra Worst, FNP  ibuprofen  (ADVIL ) 800 MG tablet Take 1 tablet (800 mg total) by mouth every 8 (eight)  hours as needed (pain). Take with food to avoid stomach upset. Do not take any additional NSAIDs while on this. You may take tylenol  in addition to this if needed for extra pain relief. 05/20/24  Yes Iola Lukes, FNP  NEOMYCIN -POLYMYXIN-HYDROCORTISONE (CORTISPORIN) 1 % SOLN OTIC solution Place 3 drops into the right ear in the morning, at noon, in the evening, and at bedtime for 7 days. 05/20/24 05/27/24 Yes Iola Lukes, FNP    Family History Family History  Problem Relation Age of Onset   Breast cancer Mother 15       blood cancer too   Brain cancer Paternal Aunt        diagnosed in late 70s to early 46s   Breast cancer Cousin        paternal cousin diagnosed; diagnosed in her late 1s   Colon cancer Neg Hx    Colon polyps Neg Hx    Esophageal cancer Neg Hx    Rectal cancer Neg Hx    Stomach cancer Neg Hx     Social History Social History   Tobacco Use   Smoking status: Former    Current packs/day: 0.00    Types: Cigarettes    Quit date: 11/30/1993    Years since quitting: 30.4   Smokeless tobacco: Never  Vaping Use   Vaping status: Never Used  Substance Use Topics   Alcohol use: Not Currently    Comment: 1x per week   Drug use: No     Allergies   Patient has no known allergies.   Review of Systems Review of Systems  Constitutional:  Negative for fever.  HENT:  Positive for ear pain and sore throat.   Neurological:  Positive for headaches. Negative for dizziness.  All other systems reviewed and are negative.    Physical Exam Triage Vital Signs ED Triage Vitals [05/20/24 1417]  Encounter Vitals Group     BP (!) 156/100     Girls Systolic BP Percentile      Girls Diastolic BP Percentile      Boys Systolic BP Percentile      Boys Diastolic BP Percentile      Pulse Rate 76     Resp 20     Temp 99.8 F (37.7 C)     Temp Source Oral     SpO2 96 %     Weight      Height      Head Circumference      Peak Flow      Pain Score 10     Pain Loc       Pain Education      Exclude from Growth Chart    No data found.  Updated Vital Signs BP (!) 156/100 (BP Location: Right Arm)   Pulse 76   Temp 99.8 F (37.7 C) (  Oral)   Resp 20   LMP 10/24/2018   SpO2 96%   Visual Acuity Right Eye Distance:   Left Eye Distance:   Bilateral Distance:    Right Eye Near:   Left Eye Near:    Bilateral Near:     Physical Exam Vitals reviewed.  Constitutional:      General: She is awake. She is not in acute distress.    Appearance: Normal appearance. She is well-developed. She is not ill-appearing, toxic-appearing or diaphoretic.  HENT:     Head: Normocephalic.     Right Ear: Hearing and external ear normal. Swelling and tenderness present. No drainage. No mastoid tenderness.     Left Ear: Hearing, tympanic membrane, ear canal and external ear normal.     Ears:     Comments: Unable to visualize TM due to canal swelling.  Tragal tenderness also noted.    Nose: Nose normal.     Mouth/Throat:     Mouth: Mucous membranes are moist.     Pharynx: Oropharynx is clear. Uvula midline.  Eyes:     General: Vision grossly intact.     Conjunctiva/sclera: Conjunctivae normal.  Cardiovascular:     Rate and Rhythm: Normal rate and regular rhythm.     Heart sounds: Normal heart sounds.  Pulmonary:     Effort: Pulmonary effort is normal.     Breath sounds: Normal breath sounds and air entry.  Musculoskeletal:        General: Normal range of motion.     Cervical back: Full passive range of motion without pain, normal range of motion and neck supple.  Lymphadenopathy:     Cervical: No cervical adenopathy.  Skin:    General: Skin is warm and dry.  Neurological:     General: No focal deficit present.     Mental Status: She is alert and oriented to person, place, and time.  Psychiatric:        Speech: Speech normal.        Behavior: Behavior is cooperative.      UC Treatments / Results  Labs (all labs ordered are listed, but only abnormal results  are displayed) Labs Reviewed - No data to display  EKG   Radiology No results found.  Procedures Procedures (including critical care time)  Medications Ordered in UC Medications  ketorolac  (TORADOL ) 30 MG/ML injection 60 mg (has no administration in time range)    Initial Impression / Assessment and Plan / UC Course  I have reviewed the triage vital signs and the nursing notes.  Pertinent labs & imaging results that were available during my care of the patient were reviewed by me and considered in my medical decision making (see chart for details).     Patient presents with right ear pain that began yesterday and worsened following the use of an antiseptic spray to clean the ear. Examination reveals significant swelling of the external auditory canal, limiting visualization of the tympanic membrane. The patient also reports a right-sided headache and throat pain but is able to swallow and manage oral secretions without difficulty, and the airway remains intact. These findings are consistent with otitis externa, likely aggravated by the use of the antiseptic spray. An intramuscular Toradol  injection was given in clinic for pain relief. Amoxicillin  was prescribed to address any potential underlying bacterial infection, and antibiotic ear drops were prescribed with instructions provided on proper administration. Patient was advised to avoid inserting any objects into the ear, including cotton balls. Follow-up  with primary care is recommended if symptoms persist or worsen. ED precautions were reviewed, including return for increasing pain, fever, drainage, difficulty swallowing, or new neurological symptoms.  Today's evaluation has revealed no signs of a dangerous process. Discussed diagnosis with patient and/or guardian. Patient and/or guardian aware of their diagnosis, possible red flag symptoms to watch out for and need for close follow up. Patient and/or guardian understands verbal and  written discharge instructions. Patient and/or guardian comfortable with plan and disposition.  Patient and/or guardian has a clear mental status at this time, good insight into illness (after discussion and teaching) and has clear judgment to make decisions regarding their care  Documentation was completed with the aid of voice recognition software. Transcription may contain typographical errors. Final Clinical Impressions(s) / UC Diagnoses   Final diagnoses:  Acute otitis externa of right ear, unspecified type  Acute otalgia, right     Discharge Instructions      You have been diagnosed with an ear infection and have been prescribed both an oral antibiotic and ear drops to help treat the infection and reduce inflammation and discomfort.   Take the oral antibiotic exactly as directed until it is finished, even if you start to feel better before it's gone. Stopping early can cause the infection to come back or become harder to treat.   Use the ear drops as prescribed. Before using them, gently clean the outer ear with a soft cloth if needed--do not insert anything deep into the ear. Warm the bottle slightly by holding it in your hands for a few minutes before using, as cold drops can cause dizziness. Tilt your head or lie on your side with the affected ear facing up. Place the prescribed number of drops into the ear, then stay in that position for a few minutes to allow the drops to soak in properly.   You may experience some mild discomfort or fullness in the ear while the infection is healing. You can take acetaminophen  or ibuprofen  if needed for pain, unless otherwise instructed. Avoid getting water in the affected ear while bathing or showering. Do not swim until the infection is fully resolved.   If your symptoms get worse, you develop fever, swelling behind the ear, or drainage from the ear, seek medical attention promptly. Follow up with your doctor as directed or if there's no  improvement after a few days.     ED Prescriptions     Medication Sig Dispense Auth. Provider   NEOMYCIN -POLYMYXIN-HYDROCORTISONE (CORTISPORIN) 1 % SOLN OTIC solution Place 3 drops into the right ear in the morning, at noon, in the evening, and at bedtime for 7 days. 4.2 mL Iola Lukes, FNP   amoxicillin  (AMOXIL ) 875 MG tablet Take 1 tablet (875 mg total) by mouth 2 (two) times daily. 14 tablet Ceyda Peterka, [place], FNP   ibuprofen  (ADVIL ) 800 MG tablet Take 1 tablet (800 mg total) by mouth every 8 (eight) hours as needed (pain). Take with food to avoid stomach upset. Do not take any additional NSAIDs while on this. You may take tylenol  in addition to this if needed for extra pain relief. 21 tablet Iola Lukes, FNP      PDMP not reviewed this encounter.   Iola [place], [place] 05/20/24 [id number]

## 2024-05-20 NOTE — ED Triage Notes (Signed)
 Pt c/o right ear pain st's started yesterday  St's now pain is radiating into right side of neck

## 2024-05-22 ENCOUNTER — Emergency Department (HOSPITAL_COMMUNITY)

## 2024-05-22 ENCOUNTER — Other Ambulatory Visit: Payer: Self-pay

## 2024-05-22 ENCOUNTER — Inpatient Hospital Stay (HOSPITAL_COMMUNITY)
Admission: EM | Admit: 2024-05-22 | Discharge: 2024-05-25 | DRG: 154 | Disposition: A | Discharge status: Home / Self Care | Attending: Family Medicine | Admitting: Family Medicine

## 2024-05-22 ENCOUNTER — Encounter (HOSPITAL_COMMUNITY): Payer: Self-pay

## 2024-05-22 DIAGNOSIS — Z923 Personal history of irradiation: Secondary | ICD-10-CM

## 2024-05-22 DIAGNOSIS — B954 Other streptococcus as the cause of diseases classified elsewhere: Secondary | ICD-10-CM | POA: Diagnosis not present

## 2024-05-22 DIAGNOSIS — J189 Pneumonia, unspecified organism: Secondary | ICD-10-CM | POA: Diagnosis not present

## 2024-05-22 DIAGNOSIS — I301 Infective pericarditis: Secondary | ICD-10-CM | POA: Diagnosis present

## 2024-05-22 DIAGNOSIS — J90 Pleural effusion, not elsewhere classified: Secondary | ICD-10-CM | POA: Diagnosis not present

## 2024-05-22 DIAGNOSIS — Z87891 Personal history of nicotine dependence: Secondary | ICD-10-CM | POA: Diagnosis not present

## 2024-05-22 DIAGNOSIS — R634 Abnormal weight loss: Secondary | ICD-10-CM | POA: Diagnosis not present

## 2024-05-22 DIAGNOSIS — Z8679 Personal history of other diseases of the circulatory system: Secondary | ICD-10-CM | POA: Insufficient documentation

## 2024-05-22 DIAGNOSIS — F322 Major depressive disorder, single episode, severe without psychotic features: Secondary | ICD-10-CM | POA: Diagnosis not present

## 2024-05-22 DIAGNOSIS — J9851 Mediastinitis: Secondary | ICD-10-CM | POA: Diagnosis not present

## 2024-05-22 DIAGNOSIS — J390 Retropharyngeal and parapharyngeal abscess: Secondary | ICD-10-CM

## 2024-05-22 DIAGNOSIS — R918 Other nonspecific abnormal finding of lung field: Secondary | ICD-10-CM | POA: Diagnosis not present

## 2024-05-22 DIAGNOSIS — R9431 Abnormal electrocardiogram [ECG] [EKG]: Secondary | ICD-10-CM | POA: Insufficient documentation

## 2024-05-22 DIAGNOSIS — J853 Abscess of mediastinum: Secondary | ICD-10-CM | POA: Diagnosis not present

## 2024-05-22 DIAGNOSIS — R509 Fever, unspecified: Secondary | ICD-10-CM | POA: Diagnosis not present

## 2024-05-22 DIAGNOSIS — Z853 Personal history of malignant neoplasm of breast: Secondary | ICD-10-CM | POA: Diagnosis not present

## 2024-05-22 DIAGNOSIS — R609 Edema, unspecified: Secondary | ICD-10-CM | POA: Diagnosis not present

## 2024-05-22 DIAGNOSIS — J439 Emphysema, unspecified: Secondary | ICD-10-CM | POA: Diagnosis present

## 2024-05-22 DIAGNOSIS — H9209 Otalgia, unspecified ear: Secondary | ICD-10-CM | POA: Diagnosis not present

## 2024-05-22 DIAGNOSIS — I319 Disease of pericardium, unspecified: Secondary | ICD-10-CM | POA: Diagnosis not present

## 2024-05-22 DIAGNOSIS — R682 Dry mouth, unspecified: Secondary | ICD-10-CM | POA: Diagnosis present

## 2024-05-22 DIAGNOSIS — Z86000 Personal history of in-situ neoplasm of breast: Secondary | ICD-10-CM

## 2024-05-22 DIAGNOSIS — E46 Unspecified protein-calorie malnutrition: Secondary | ICD-10-CM | POA: Diagnosis not present

## 2024-05-22 DIAGNOSIS — R079 Chest pain, unspecified: Secondary | ICD-10-CM | POA: Diagnosis not present

## 2024-05-22 DIAGNOSIS — Z9009 Acquired absence of other part of head and neck: Secondary | ICD-10-CM | POA: Diagnosis not present

## 2024-05-22 DIAGNOSIS — J387 Other diseases of larynx: Principal | ICD-10-CM | POA: Diagnosis present

## 2024-05-22 DIAGNOSIS — J181 Lobar pneumonia, unspecified organism: Secondary | ICD-10-CM | POA: Diagnosis not present

## 2024-05-22 DIAGNOSIS — R0789 Other chest pain: Secondary | ICD-10-CM | POA: Diagnosis not present

## 2024-05-22 DIAGNOSIS — J36 Peritonsillar abscess: Secondary | ICD-10-CM | POA: Diagnosis not present

## 2024-05-22 DIAGNOSIS — I6523 Occlusion and stenosis of bilateral carotid arteries: Secondary | ICD-10-CM | POA: Diagnosis not present

## 2024-05-22 DIAGNOSIS — R809 Proteinuria, unspecified: Secondary | ICD-10-CM | POA: Insufficient documentation

## 2024-05-22 DIAGNOSIS — J9811 Atelectasis: Secondary | ICD-10-CM | POA: Diagnosis not present

## 2024-05-22 DIAGNOSIS — J0430 Supraglottitis, unspecified, without obstruction: Secondary | ICD-10-CM | POA: Diagnosis not present

## 2024-05-22 DIAGNOSIS — I3139 Other pericardial effusion (noninflammatory): Secondary | ICD-10-CM | POA: Diagnosis not present

## 2024-05-22 DIAGNOSIS — B966 Bacteroides fragilis [B. fragilis] as the cause of diseases classified elsewhere: Secondary | ICD-10-CM | POA: Diagnosis not present

## 2024-05-22 DIAGNOSIS — R590 Localized enlarged lymph nodes: Secondary | ICD-10-CM | POA: Diagnosis not present

## 2024-05-22 DIAGNOSIS — R0602 Shortness of breath: Secondary | ICD-10-CM | POA: Diagnosis not present

## 2024-05-22 HISTORY — DX: Retropharyngeal and parapharyngeal abscess: J39.0

## 2024-05-22 HISTORY — DX: Emphysema, unspecified: J43.9

## 2024-05-22 HISTORY — DX: Mediastinitis: J98.51

## 2024-05-22 LAB — CBC WITH DIFFERENTIAL/PLATELET
Abs Immature Granulocytes: 0 K/uL (ref 0.00–0.07)
Basophils Absolute: 0 K/uL (ref 0.0–0.1)
Basophils Relative: 0 %
Eosinophils Absolute: 0 K/uL (ref 0.0–0.5)
Eosinophils Relative: 0 %
HCT: 40.2 % (ref 36.0–46.0)
Hemoglobin: 13.4 g/dL (ref 12.0–15.0)
Lymphocytes Relative: 14 %
Lymphs Abs: 0.8 K/uL (ref 0.7–4.0)
MCH: 33 pg (ref 26.0–34.0)
MCHC: 33.3 g/dL (ref 30.0–36.0)
MCV: 99 fL (ref 80.0–100.0)
Monocytes Absolute: 0.4 K/uL (ref 0.1–1.0)
Monocytes Relative: 7 %
Neutro Abs: 4.7 K/uL (ref 1.7–7.7)
Neutrophils Relative %: 79 %
Platelets: 155 K/uL (ref 150–400)
RBC: 4.06 MIL/uL (ref 3.87–5.11)
RDW: 13.4 % (ref 11.5–15.5)
WBC: 5.9 K/uL (ref 4.0–10.5)
nRBC: 0 % (ref 0.0–0.2)
nRBC: 0 /100{WBCs}

## 2024-05-22 LAB — COMPREHENSIVE METABOLIC PANEL WITH GFR
ALT: 12 U/L (ref 0–44)
AST: 21 U/L (ref 15–41)
Albumin: 3.1 g/dL — ABNORMAL LOW (ref 3.5–5.0)
Alkaline Phosphatase: 76 U/L (ref 38–126)
Anion gap: 14 (ref 5–15)
BUN: 22 mg/dL — ABNORMAL HIGH (ref 6–20)
CO2: 18 mmol/L — ABNORMAL LOW (ref 22–32)
Calcium: 9.3 mg/dL (ref 8.9–10.3)
Chloride: 103 mmol/L (ref 98–111)
Creatinine, Ser: 0.82 mg/dL (ref 0.44–1.00)
GFR, Estimated: >60 mL/min (ref >60)
Glucose, Bld: 138 mg/dL — ABNORMAL HIGH (ref 70–99)
Potassium: 3.2 mmol/L — ABNORMAL LOW (ref 3.5–5.1)
Sodium: 135 mmol/L (ref 135–145)
Total Bilirubin: 1 mg/dL (ref 0.0–1.2)
Total Protein: 7.3 g/dL (ref 6.5–8.1)

## 2024-05-22 LAB — I-STAT CG4 LACTIC ACID, ED
Lactic Acid, Venous: 1.3 mmol/L (ref 0.5–1.9)
Lactic Acid, Venous: 1.5 mmol/L (ref 0.5–1.9)

## 2024-05-22 MED ORDER — THIAMINE MONONITRATE 100 MG PO TABS
100.0000 mg | ORAL_TABLET | Freq: Every day | ORAL | Status: DC
Start: 2024-05-23 — End: 2024-05-22

## 2024-05-22 MED ORDER — IOHEXOL 350 MG/ML SOLN: 50.0000 mL | Freq: Once | INTRAVENOUS | Status: DC | PRN

## 2024-05-22 MED ORDER — POTASSIUM CHLORIDE 10 MEQ/100ML IV SOLN
10.0000 meq | INTRAVENOUS | Status: AC
  Administered 2024-05-22 – 2024-05-23 (×2): 10 meq via INTRAVENOUS
  Filled 2024-05-22 (×2): qty 100

## 2024-05-22 MED ORDER — PIPERACILLIN-TAZOBACTAM 3.375 G IVPB: 3.3750 g | Freq: Three times a day (TID) | INTRAVENOUS | Status: DC

## 2024-05-22 MED ORDER — KETOROLAC TROMETHAMINE 15 MG/ML IJ SOLN
15.0000 mg | Freq: Once | INTRAMUSCULAR | Status: AC
  Administered 2024-05-22: 15 mg via INTRAVENOUS
  Filled 2024-05-22: qty 1

## 2024-05-22 MED ORDER — DEXAMETHASONE SODIUM PHOSPHATE 10 MG/ML IJ SOLN
10.0000 mg | INTRAMUSCULAR | Status: DC
  Administered 2024-05-23 – 2024-05-24 (×2): 10 mg via INTRAVENOUS
  Filled 2024-05-22 (×2): qty 1

## 2024-05-22 MED ORDER — ENOXAPARIN SODIUM 40 MG/0.4ML IJ SOSY
40.0000 mg | PREFILLED_SYRINGE | Freq: Every day | INTRAMUSCULAR | Status: DC
  Administered 2024-05-23 – 2024-05-24 (×3): 40 mg via SUBCUTANEOUS
  Filled 2024-05-22 (×3): qty 0.4

## 2024-05-22 MED ORDER — IOHEXOL 350 MG/ML SOLN
50.0000 mL | Freq: Once | INTRAVENOUS | Status: AC | PRN
  Administered 2024-05-22: 50 mL via INTRAVENOUS

## 2024-05-22 MED ORDER — FOLIC ACID 1 MG PO TABS: 1.0000 mg | ORAL_TABLET | Freq: Every day | ORAL | Status: DC

## 2024-05-22 MED ORDER — HYDROMORPHONE HCL 1 MG/ML IJ SOLN
1.0000 mg | Freq: Once | INTRAMUSCULAR | Status: AC
  Administered 2024-05-22: 1 mg via INTRAVENOUS
  Filled 2024-05-22: qty 1

## 2024-05-22 MED ORDER — THIAMINE HCL 100 MG/ML IJ SOLN: 100.0000 mg | Freq: Every day | INTRAMUSCULAR | Status: DC

## 2024-05-22 MED ORDER — SODIUM CHLORIDE 0.9 % IV BOLUS
500.0000 mL | Freq: Once | INTRAVENOUS | Status: AC
  Administered 2024-05-22: 500 mL via INTRAVENOUS

## 2024-05-22 MED ORDER — HYDROMORPHONE HCL 1 MG/ML IJ SOLN
1.0000 mg | INTRAMUSCULAR | Status: DC | PRN
  Administered 2024-05-22: 1 mg via INTRAVENOUS
  Filled 2024-05-22: qty 1

## 2024-05-22 MED ORDER — SODIUM CHLORIDE 0.9 % IV SOLN
3.0000 g | Freq: Once | INTRAVENOUS | Status: AC
  Administered 2024-05-22: 3 g via INTRAVENOUS
  Filled 2024-05-22: qty 8

## 2024-05-22 MED ORDER — KETOROLAC TROMETHAMINE 15 MG/ML IJ SOLN
15.0000 mg | Freq: Four times a day (QID) | INTRAMUSCULAR | Status: DC
  Administered 2024-05-23 – 2024-05-24 (×6): 15 mg via INTRAVENOUS
  Filled 2024-05-22 (×6): qty 1

## 2024-05-22 MED ORDER — HYDROMORPHONE HCL 1 MG/ML IJ SOLN: 1.0000 mg | INTRAMUSCULAR | Status: DC | PRN

## 2024-05-22 MED ORDER — SODIUM CHLORIDE 0.9 % IV SOLN: Freq: Once | INTRAVENOUS | Status: AC

## 2024-05-22 MED ORDER — PIPERACILLIN-TAZOBACTAM 3.375 G IVPB
3.3750 g | Freq: Three times a day (TID) | INTRAVENOUS | Status: DC
  Administered 2024-05-23: 3.375 g via INTRAVENOUS
  Filled 2024-05-22: qty 50

## 2024-05-22 MED ORDER — IOHEXOL 350 MG/ML SOLN
75.0000 mL | Freq: Once | INTRAVENOUS | Status: AC | PRN
  Administered 2024-05-22: 75 mL via INTRAVENOUS

## 2024-05-22 MED ORDER — ADULT MULTIVITAMIN W/MINERALS CH: 1.0000 | ORAL_TABLET | Freq: Every day | ORAL | Status: DC

## 2024-05-22 MED ORDER — ACETAMINOPHEN 10 MG/ML IV SOLN
1000.0000 mg | Freq: Four times a day (QID) | INTRAVENOUS | Status: AC
  Administered 2024-05-22 – 2024-05-23 (×4): 1000 mg via INTRAVENOUS
  Filled 2024-05-22 (×5): qty 100

## 2024-05-22 MED ORDER — DEXAMETHASONE SODIUM PHOSPHATE 10 MG/ML IJ SOLN
10.0000 mg | Freq: Once | INTRAMUSCULAR | Status: AC
  Administered 2024-05-22: 10 mg via INTRAVENOUS
  Filled 2024-05-22: qty 1

## 2024-05-22 NOTE — ED Provider Notes (Addendum)
 Call Dr. Leonore when CT chest done. Physical Exam  BP (!) 112/55 (BP Location: Right Arm)   Pulse 99   Temp 98.3 F (36.8 C) (Oral)   Resp 20   LMP 10/24/2018   SpO2 98%   Physical Exam  Procedures  Procedures  ED Course / MDM    Medical Decision Making Amount and/or Complexity of Data Reviewed Labs: ordered. Radiology: ordered.  Risk Prescription drug management. Decision regarding hospitalization.   CT scan does show pockets of fluid in the mediastinum.  Consult placed to Dr. Roark to address these findings and ultimate disposition.  I have rechecked the patient.  She is alert and nontoxic.  Voice is slightly muffled but no respiratory distress.  Airway is patent.  Patient does report she has pain and the sensation of a cramp in her left lower chest.  Will add a milligram Dilaudid  for pain control.  Consult: 18: 05 discussed with Dr. Roark.  Unclear what processes that were care.  Will discuss with CT surgery and discussed the management plan.  Consult: 18: 15 Dr. Erik nurse in the OR.  Dr. Sherrine is currently in surgery.  I relayed the CT image findings and consult nature.  He will call back when done with surgery  Recheck 19: 12 patient reports she got pain medication about 30 minutes ago.  She reports it is helping but she still uncomfortable on the left side of the thoracic chest.  Will give additional half milligram Dilaudid .  Patient is alert.  She has not had any increasing respiratory difficulty.  Voice is unchanged.  Nontoxic appearance.  Consult: Dr. Sherrine has returned call.  At this time he advises that the source of abscess will be in the neck pharynx or esophagus.  Spreading into the mediastinum is not been unanticipated complication of these infections.  He has reviewed the CT imaging and there is no fluid collection that would be amenable to cardiothoracic surgical intervention or drainage.  At this time agrees with the plan of antibiotic therapy, admission and  ENT management of any head neck abscesses with drainage if indicated.  If formal consult is requested by admitting team, the consult should be placed and patient will be seen tomorrow.  Consult: Family Practice service for medical admission.   CRITICAL CARE Performed by: Ludivina Shines   Total critical care time: 45 minutes  Critical care time was exclusive of separately billable procedures and treating other patients.  Critical care was necessary to treat or prevent imminent or life-threatening deterioration.  Critical care was time spent personally by me on the following activities: development of treatment plan with patient and/or surrogate as well as nursing, discussions with consultants, evaluation of patient's response to treatment, examination of patient, obtaining history from patient or surrogate, ordering and performing treatments and interventions, ordering and review of laboratory studies, ordering and review of radiographic studies, pulse oximetry and re-evaluation of patient's condition.        Shines Ludivina, MD 05/22/24 [place]    Shines Ludivina, MD 05/22/24 2059

## 2024-05-22 NOTE — Assessment & Plan Note (Addendum)
 Patient afebrile, hemodynamically stable, on room air with normal work of breathing, no increased shortness of breath.  CT neck shows supraglottic abscess, retropharyngeal abscess, small peritonsillar abscesses, moderate supraglottic airway narrowing, potential extension of collection to mediastinum.  CT chest suspicious for mediastinitis, esophageal thickening likely 2/2 inflammation.  ENT and cardiothoracic surgery consulted in the ED and feel no surgical intervention is needed at this time. - [facility] will admit for IV antibiotic and pain management, attending Dr. McDiarmid - ENT will follow, appreciate recommendations, can formally consult cardiothoracic surgery as needed - Patient received 1 dose of Unasyn  in the ED (7/9), will continue with Zosyn  every 8 hours (7/10 - ) given potential otogenic source and need for pseudomonal coverage - Will collect MRSA swab to assess for potential MRSA involvement; can consider addition of vancomycin , especially if patient is not improving - Tylenol  1 g every 6 hours, Toradol  15 mg every 6 hours, and Dilaudid  1 mg every 4 hours as needed for severe pain - Dexamethasone  10 mg daily - Blood cultures collected, will follow - Can consider autoimmune workup - AM CBC, BMP, magnesium, HIV

## 2024-05-22 NOTE — Assessment & Plan Note (Signed)
 As seen on CT chest.  Patient does have smoking history.  Reassuringly without oxygen requirement or wheezes on exam. - Consider DuoNebs or maintenance therapy as needed, otherwise follow-up outpatient

## 2024-05-22 NOTE — ED Provider Notes (Signed)
 [facility] Provider Note   CSN: [id number] Arrival date & time: 05/22/24  1053     Patient presents with: Sore Throat, Otalgia, and Back Pain   Sue [name] is a 58 y.o. female.   58 year old female presenting to the emergency department for sore throat and difficulty swallowing.  Patient was seen 2 days ago at urgent care -diagnosed with otitis externa and started on amoxicillin .  She has reportedly had decreased oral intake and has been sleeping more frequently over the last few days.  She denies any fevers.   Sore Throat  Otalgia Associated symptoms: sore throat   Back Pain      Prior to Admission medications   Medication Sig Start Date End Date Taking? Authorizing Provider  amoxicillin  (AMOXIL ) 875 MG tablet Take 1 tablet (875 mg total) by mouth 2 (two) times daily. 05/20/24   Murrill, Samantha, FNP  ibuprofen  (ADVIL ) 800 MG tablet Take 1 tablet (800 mg total) by mouth every 8 (eight) hours as needed (pain). Take with food to avoid stomach upset. Do not take any additional NSAIDs while on this. You may take tylenol  in addition to this if needed for extra pain relief. 05/20/24   Iola Lukes, FNP  NEOMYCIN -POLYMYXIN-HYDROCORTISONE (CORTISPORIN) 1 % SOLN OTIC solution Place 3 drops into the right ear in the morning, at noon, in the evening, and at bedtime for 7 days. 05/20/24 05/27/24  Murrill, Samantha, FNP    Allergies: Patient has no known allergies.    Review of Systems  HENT:  Positive for ear pain and sore throat.   All other systems reviewed and are negative.   Updated Vital Signs BP (!) 112/55 (BP Location: Right Arm)   Pulse 99   Temp 98.3 F (36.8 C) (Oral)   Resp 20   LMP 10/24/2018   SpO2 98%   Physical Exam Vitals and nursing note reviewed.  HENT:     Head: Normocephalic and atraumatic.     Comments: Muffled voice    Left Ear: Swelling present.     Mouth/Throat:     Mouth: Mucous membranes are dry.      Comments: Poor dentition Swollen tonsils bilaterally Cardiovascular:     Rate and Rhythm: Regular rhythm. Tachycardia present.  Pulmonary:     Effort: Pulmonary effort is normal.  Lymphadenopathy:     Cervical: Cervical adenopathy present.  Skin:    General: Skin is warm.     Capillary Refill: Capillary refill takes less than 2 seconds.  Neurological:     Mental Status: She is alert and oriented to person, place, and time.     (all labs ordered are listed, but only abnormal results are displayed) Labs Reviewed  COMPREHENSIVE METABOLIC PANEL WITH GFR - Abnormal; Notable for the following components:      Result Value   Potassium 3.2 (*)    CO2 18 (*)    Glucose, Bld 138 (*)    BUN 22 (*)    Albumin 3.1 (*)    All other components within normal limits  CBC WITH DIFFERENTIAL/PLATELET  URINALYSIS, W/ REFLEX TO CULTURE (INFECTION SUSPECTED)  PATHOLOGIST SMEAR REVIEW  I-STAT CG4 LACTIC ACID, ED  I-STAT CG4 LACTIC ACID, ED    EKG: None  Radiology: CT Soft Tissue Neck W Contrast Addendum Date: 05/22/2024 ADDENDUM REPORT: 05/22/2024 15:50 ADDENDUM: These results were called by telephone at the time of interpretation on 05/22/2024 at 3:45 pm to provider Kristofer Schaffert , who verbally  acknowledged these results. Electronically Signed   By: Donnice Mania M.D.   On: 05/22/2024 15:50   Result Date: 05/22/2024 CLINICAL DATA:  Tonsil, adenoid disorder.  Ear and throat pain. EXAM: CT NECK WITH CONTRAST TECHNIQUE: Multidetector CT imaging of the neck was performed using the standard protocol following the bolus administration of intravenous contrast. RADIATION DOSE REDUCTION: This exam was performed according to the departmental dose-optimization program which includes automated exposure control, adjustment of the mA and/or kV according to patient size and/or use of iterative reconstruction technique. CONTRAST:  75mL OMNIPAQUE  IOHEXOL  350 MG/ML SOLN COMPARISON:  None Available. FINDINGS: Pharynx and  larynx: There is a 2.3 x 1.3 x 2.3 cm peripherally enhancing fluid collection involving the right aspect of the supraglottic airway involving the aryepiglottic folds and paraglottic fat concern for abscess. There is associated mass effect on the airway with moderate narrowing and significant effacement of the right piriform sinus. Additional component of the collection extends along the medial and inferior aspect of the hyoid within the visceral space. The collection extends inferiorly to the level of the thyroid and extends anteriorly to midline. There is a distal component of the collection which extends into the retropharyngeal space with peripheral enhancement suggestive of retropharyngeal abscess. This portion of the abscess measures up to 4 mm in thickness. There is additional nonenhancing edema extending into the left posterolateral aspect of the visceral space extending inferiorly along the posterolateral aspect of the left thyroid lobe with additional edema in the inferior aspect of the left carotid space. The nasopharynx is symmetric. Palatine tonsils are relatively symmetric in size with multiple calcifications suggestive of tonsilliths versus prior inflammation. There are additional peripherally enhancing collections noted along the inferior medial aspect of the right palatine tonsil measuring 0.6 x 0.5 x 0.9 cm and 0.5 x 0.6 x 0.6 cm respectively. The palate, oral cavity, and floor of mouth are unremarkable. There is thickening of the epiglottis likely reflecting reactive edema. Salivary glands: No inflammation, mass, or stone. Thyroid: Normal. Lymph nodes: Enlarged right level 2A cervical nodes measuring up to 1.2 cm in short axis. There are multiple left-sided cervical lymph nodes which measure less than 1 cm in short axis. Vascular: Mild atherosclerosis at the carotid bifurcations without evidence of high-grade stenosis. Limited intracranial: Limited visualization of intracranial structures without  focal acute abnormality. Visualized orbits: Negative. Mastoids and visualized paranasal sinuses: Mild mucosal thickening in the alveolar recesses of the maxillary sinuses. Mastoid air cells are clear. Skeleton: No acute or aggressive process. Upper chest: Paraseptal emphysema in the lung apices. Additional areas of centrilobular emphysema. There is ill-defined edema within the partially visualized superior mediastinum. Other: None. IMPRESSION: Large complex abscess involving the right aspect of the supraglottic airway with involvement of the right aryepiglottic fold and paraglottic fat. Collection extends along the inferior medial aspect of the hyoid into the visceral space along the right aspect of the airway. Collection extends inferiorly to the level of the thyroid. Additional component of retropharyngeal abscess. Additional small peritonsillar abscesses in the inferior medial aspect of the right palatine tonsil. Moderate airway narrowing of the supraglottic airway. Potential for extension of the collection to the mediastinum. There is ill-defined edema within the partially visualized superior mediastinum. Recommend correlation with dedicated chest CT. Thickening of the epiglottis likely reflecting reactive edema. Enlarged right 2A cervical node, likely reactive. Emphysema (ICD10-J43.9). Electronically Signed: By: Donnice Mania M.D. On: 05/22/2024 15:33   DG Chest 2 View Result Date: 05/22/2024 CLINICAL DATA:  Chest pain,  shortness of breath. EXAM: CHEST - 2 VIEW COMPARISON:  August 23, 2012. FINDINGS: The heart size and mediastinal contours are within normal limits. Hypoinflation of the lungs is noted with minimal bibasilar subsegmental atelectasis. The visualized skeletal structures are unremarkable. IMPRESSION: Hypoinflation of the lungs with minimal bibasilar subsegmental atelectasis. Electronically Signed   By: Lynwood Landy Raddle M.D.   On: 05/22/2024 12:35     Procedures   Medications Ordered in the  ED  Ampicillin -Sulbactam (UNASYN ) 3 g in sodium chloride  0.9 % 100 mL IVPB (3 g Intravenous New Bag/Given 05/22/24 1554)  dexamethasone  (DECADRON ) injection 10 mg (10 mg Intravenous Given 05/22/24 1332)  ketorolac  (TORADOL ) 15 MG/ML injection 15 mg (15 mg Intravenous Given 05/22/24 1334)  iohexol  (OMNIPAQUE ) 350 MG/ML injection 75 mL (75 mLs Intravenous Contrast Given 05/22/24 1428)                                    Medical Decision Making 58 year old female presenting to the emergency department with ongoing sore throat and decreased appetite.  She has been on amoxicillin  for 2 days.  She complains of sore throat and difficulty swallowing.  She does have a muffled voice but no obvious trismus.  She is afebrile for us  here in the emergency department.  She has no evidence of respiratory distress.  Differential includes peritonsillar abscess, RPA, dental abscess, peritonsillar cellulitis, and others.  IV fluids, Decadron , and Toradol  ordered. She has a normal white blood cell count and lactic acid.  She was complaining of some chest discomfort secondary to drainage from her sinuses.  Her chest x-ray does not show evidence of pneumonia.  CT soft tissue neck shows large complex abscesses involving the supraglottic airway extending along the inferior medial aspect of the hyoid into the visceral space and extending down to the level of the thyroid.  He also has retropharyngeal abscesses and additional small peritonsillar abscesses.  There is narrowing of the supraglottic airway and reactive thickening of the epiglottis.  Consult was placed to ENT to discuss the CT scan.  I have added on a CT of the thorax to evaluate for any extension into the mediastinum.  I have also placed the patient on IV antibiotics.  Consulted with Dr. Roark ENT -he would like to be notified when the CT chest is completed since extension into the mediastinum may require transfer to [facility].  Patient signed out to Dr.  Armenta for further management pending CT chest.    Amount and/or Complexity of Data Reviewed Labs: ordered. Radiology: ordered.  Risk Prescription drug management.     Final diagnoses:  None    ED Discharge Orders     None          Carol Loftin, DO 05/22/24 1615

## 2024-05-22 NOTE — Hospital Course (Addendum)
 Sue [name] is a 58 y.o. female who was admitted to the [facility] at [facility] for deep space neck infection with extension into the mediastinum. Hospital course is outlined below by problem.   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  Previously diagnosed with AOM at urgent care and given amoxicillin  with antibiotic eardrops though was refractory.  CT neck and chest in the ED demonstrated supraglottic and retropharyngeal abscesses with mediastinitis.  ENT and cardiothoracic surgery consulted who felt no surgical intervention needed.  She was placed on IV Zosyn  (7/10), IV Vancomycin  (7/10), and IV dexamethasone  (7/10-***), and pain medications. Antibiotics later adjusted to IV Unasyn  (7/10-***) per ID recommendations. Pain initially managed with IV ketorolac , later switched to PO acetaminophen . Source felt to be otogenic; poor dentition ruled out with orthopantogram done 7.10 negative for significant periodontal disease. Repeat chest imaging done *** showed ***.  Pericarditis Pt with EKG showing nonspecific ST abnormality then with echo 6/10 showing small pericardial effusion, and with physical symptoms of chest pain increased with sitting forward and improved with lying back; diagnosed with pericarditis. Kept on IV dexamethasone . Cardiology consulted, in agreement. ***Pt discharged on colchicine .  Other conditions that were chronic and stable: Emphysema  Issues for follow up: Repeat urinalysis  Follow-up emphysema (seen on CT done 7/9) Consider limited echocardiogram a few weeks after discharge +/- repeat CT to follow-up pericarditis Consider workup for Sjogren's (anti-Ro/SSA and anti-La/SSB antibodies, ANA, rheumatoid factor) Follow-up with ENT in a few weeks

## 2024-05-22 NOTE — Consult Note (Signed)
 Reason for Consult:pharyngeal abscess Referring Physician: Dr Armenta Norris Sue [name] is an 58 y.o. female.  HPI: Patient with about 5 days of history of both right ear pain and suspected otitis externa and a sore throat.  She started off this past weekend and was having right ear pain.  She was seen by urgent care and they told her she had an otitis externa.  She was treated for that.  Her ear complaint has gone away at this point.  She has been having difficulty swallowing and things for feel like they are hanging up so she came for evaluation.  CT scan reveals a abscess like cavity in the right supraglottic vallecula area to the right.  There is also extravasation of fluid surrounding the larynx and extends down inferiorly.  There is inflammatory process in the retropharynx.  There is a small area in the right tonsil that fluid appearing.  She has no breathing difficulties.  The CT scan showed also mediastinal abscess and according to the reading a 4 cm area.  Thoracic surgery was consulted and they disagree with the size of the area and do not feel like anything needs to be pursued regarding this.  She had a swelling in the submandibular area and said that she had swelling in the floor of mouth about 2 weeks ago.  This totally resolved.  She had no dysphagia.  No breathing difficulties.  She now has not a significant sore throat.  She can open her mouth well.  She still has no appetite but feels like she might build to get liquids down.  She denies any immuno compromise.  Denies dental pain.  Past Medical History:  Diagnosis Date   Breast cancer (HCC) 08/28/2012   Left Breast   Hemorrhoids    Personal history of radiation therapy    Right foot pain    S/P radiation therapy 12/06/12 -01/23/13   Left Breast/Axilla / 46 Gy / 23 Fractions with a Boost to Left Breast / 14 Gy / 7 Fractions   Use of tamoxifen  (Nolvadex ) 01/2013   Wears glasses     Past Surgical History:  Procedure Laterality Date    ACHILLES TENDON SURGERY Right 06/02/2020   Procedure: ACHILLES LENGTHENING/KIDNER;  Surgeon: Elsa Lonni SAUNDERS, MD;  Location: [facility];  Service: Orthopedics;  Laterality: Right;   BREAST LUMPECTOMY Left    takes tamoxifen    CALCANEAL OSTEOTOMY Right 06/02/2020   Procedure: RIGHT LATERAL DISPLACEMENT CALCANEAL OSTEOTOMY, FLEXOR DIGITORUM LONGUS TRANSFER, SPRING LIGAMENT RECONSTRUCTION, POSTERIOR TIBIAL TENDON DEBRIDEMENT, DEEP ORTHOPEDIC HARDWARE REMOVAL, MEDIAL CUNEIFORM PLANTAR FLEXION OSTEOTOMY AND ACHILLES LENGTHENING, PARTIAL RESECTION OF NAVICULAR;  Surgeon: Elsa Lonni SAUNDERS, MD;  Location: [facility];  Service: Orthopedics;  Latera   DILATION AND CURETTAGE OF UTERUS     Following Miscarriage   FOOT FUSION  3/09   ankle rt   HARDWARE REMOVAL Right 06/02/2020   Procedure: HARDWARE REMOVAL;  Surgeon: Elsa Lonni SAUNDERS, MD;  Location: [facility];  Service: Orthopedics;  Laterality: Right;   Left Breast Lumpectomy  08/28/12   Left Breast Needle Core Biopsy  07/26/12   UOQ - Ductal Carcinoma In Situ with Necrosis. Microcalcifications Identified   METATARSAL OSTEOTOMY Right 06/02/2020   Procedure: METATARSAL OSTEOTOMY;  Surgeon: Elsa Lonni SAUNDERS, MD;  Location: [facility];  Service: Orthopedics;  Laterality: Right;   RE-EXCISION OF BREAST CANCER,SUPERIOR MARGINS  10/30/2012   Procedure: RE-EXCISION OF BREAST CANCER,SUPERIOR MARGINS;  Surgeon: Elon CHRISTELLA  Gail, MD;  Location: [facility];  Service: General;  Laterality: N/A;  left partial mastectomy with excision of margins   re-excision of left breast cancer on 09/10/12      Family History  Problem Relation Age of Onset   Breast cancer Mother 21       blood cancer too   Brain cancer Paternal Aunt        diagnosed in late 51s to early 1s   Breast cancer Cousin        paternal cousin diagnosed; diagnosed in her late 43s   Colon cancer Neg Hx    Colon polyps Neg Hx     Esophageal cancer Neg Hx    Rectal cancer Neg Hx    Stomach cancer Neg Hx     Social History:  reports that she quit smoking about 30 years ago. Her smoking use included cigarettes. She has never used smokeless tobacco. She reports that she does not currently use alcohol. She reports that she does not use drugs.  Allergies: No Known Allergies  Medications: I have reviewed the patient's current medications.  Results for orders placed or performed during the hospital encounter of 05/22/24 (from the past 48 hours)  Comprehensive metabolic panel     Status: Abnormal   Collection Time: 05/22/24 11:33 AM  Result Value Ref Range   Sodium 135 135 - 145 mmol/L   Potassium 3.2 (L) 3.5 - 5.1 mmol/L   Chloride 103 98 - 111 mmol/L   CO2 18 (L) 22 - 32 mmol/L   Glucose, Bld 138 (H) 70 - 99 mg/dL    Comment: Glucose reference range applies only to samples taken after fasting for at least 8 hours.   BUN 22 (H) 6 - 20 mg/dL   Creatinine, Ser 9.17 0.44 - 1.00 mg/dL   Calcium 9.3 8.9 - 89.6 mg/dL   Total Protein 7.3 6.5 - 8.1 g/dL   Albumin 3.1 (L) 3.5 - 5.0 g/dL   AST 21 15 - 41 U/L   ALT 12 0 - 44 U/L   Alkaline Phosphatase 76 38 - 126 U/L   Total Bilirubin 1.0 0.0 - 1.2 mg/dL   GFR, Estimated >39 >39 mL/min    Comment: (NOTE) Calculated using the CKD-EPI Creatinine Equation (2021)    Anion gap 14 5 - 15    Comment: Performed at [facility], [address] [place], [place] [address]  CBC with Differential     Status: None   Collection Time: 05/22/24 11:33 AM  Result Value Ref Range   WBC 5.9 4.0 - 10.5 K/uL   RBC 4.06 3.87 - 5.11 MIL/uL   Hemoglobin 13.4 12.0 - 15.0 g/dL   HCT 59.7 63.9 - 53.9 %   MCV 99.0 80.0 - 100.0 fL   MCH 33.0 26.0 - 34.0 pg   MCHC 33.3 30.0 - 36.0 g/dL   RDW 86.5 88.4 - 84.4 %   Platelets 155 150 - 400 K/uL   nRBC 0.0 0.0 - 0.2 %   Neutrophils Relative % 79 %   Neutro Abs 4.7 1.7 - 7.7 K/uL   Lymphocytes Relative 14 %   Lymphs Abs 0.8 0.7 - 4.0 K/uL    Monocytes Relative 7 %   Monocytes Absolute 0.4 0.1 - 1.0 K/uL   Eosinophils Relative 0 %   Eosinophils Absolute 0.0 0.0 - 0.5 K/uL   Basophils Relative 0 %   Basophils Absolute 0.0 0.0 - 0.1 K/uL   WBC Morphology See  Note     Comment: Increased Bands. >20% Bands  Dohle Bodies    RBC Morphology See Note     Comment: Morphology unremarkable   Smear Review See Note     Comment: Normal Platelet Morphology   nRBC 0 0 /100 WBC   Abs Immature Granulocytes 0.00 0.00 - 0.07 K/uL    Comment: Performed at [facility], [address], [place], [place] [address]  I-Stat Lactic Acid, ED     Status: None   Collection Time: 05/22/24 11:42 AM  Result Value Ref Range   Lactic Acid, Venous 1.3 0.5 - 1.9 mmol/L  I-Stat Lactic Acid, ED     Status: None   Collection Time: 05/22/24  1:45 PM  Result Value Ref Range   Lactic Acid, Venous 1.5 0.5 - 1.9 mmol/L    CT Chest W Contrast Result Date: 05/22/2024 CLINICAL DATA:  Neck abscess EXAM: CT CHEST WITH CONTRAST TECHNIQUE: Multidetector CT imaging of the chest was performed during intravenous contrast administration. RADIATION DOSE REDUCTION: This exam was performed according to the departmental dose-optimization program which includes automated exposure control, adjustment of the mA and/or kV according to patient size and/or use of iterative reconstruction technique. CONTRAST:  50mL OMNIPAQUE  IOHEXOL  350 MG/ML SOLN COMPARISON:  CT 05/22/2024, chest x-ray 05/22/2024 FINDINGS: Cardiovascular: Nonaneurysmal aorta. Mild aortic atherosclerosis. Coronary vascular calcification. Upper normal cardiac size. Trace pericardial effusion. Mediastinum/Nodes: Patent trachea. No thyroid mass. Generalized hazy density and stranding throughout the mediastinum, within the prevascular space, thoracic inlet, and within the middle and posterior mediastinum. Haziness surrounding the aorta, likely due to generalized inflammatory process within the mediastinum. Suspicion of  small complex fluid collection within the left posterior mediastinum, posterior to the left cardiac silhouette and to the left of the aorta, this measures about 2.9 x 1.7 cm by 4.4 cm on series 3, image 110 and coronal series 6, image 61 and is concerning for small abscess. Mild diffuse circumferential esophageal thickening. No mediastinal air or focal gas collections. Possible small bowel [place] organizing fluid along the distal right esophagus, series 3 image 112, extending to the diaphragmatic hiatus, this measures about 19 x 15 mm on series 3, image 124, and on coronal views extends over a craniocaudal dimension of 6.5 cm, series 6, image 64. No suspicious axillary lymph nodes. Mild mediastinal lymph nodes measuring up to 10 mm in the right precarinal space. Lungs/Pleura: Emphysema. No acute airspace disease, pleural effusion or pneumothorax. Probable small focus of scarring at the lingula. Linear scarring or atelectasis at the left base. Mild scarring or atelectasis at the right base as well. Upper Abdomen: No acute finding. Subcentimeter hypodensity in the right kidney too small to further characterize, no specific imaging follow-up is recommended. Musculoskeletal: Scoliosis.  No acute osseous abnormality. IMPRESSION: 1. Constellation of findings suspicious for diffuse mediastinitis/mediastinal infection as described above. Small complex slightly organizing left posterior mediastinal fluid collection suspicious for abscess, though no internal gas at this time. Additional fluid along the right aspect of the distal esophagus without definitive rim enhancement, but difficult to exclude small developing abscess here as well. Mild mediastinal adenopathy. 2. Mild diffuse circumferential esophageal thickening, presumably due to inflammation from diffuse inflammatory mediastinal process. No focal air collections or gas within the mediastinum to suggest perforation. 3. Emphysema. 4. Aortic atherosclerosis. Aortic  Atherosclerosis (ICD10-I70.0) and Emphysema (ICD10-J43.9). Electronically Signed   By: Luke Bun M.D.   On: 05/22/2024 17:04   CT Soft Tissue Neck W Contrast Addendum Date: 05/22/2024  ADDENDUM REPORT: 05/22/2024 15:50 ADDENDUM: These results were called by telephone at the time of interpretation on 05/22/2024 at 3:45 pm to provider AMY LOWTHER , who verbally acknowledged these results. Electronically Signed   By: Donnice Mania M.D.   On: 05/22/2024 15:50   Result Date: 05/22/2024 CLINICAL DATA:  Tonsil, adenoid disorder.  Ear and throat pain. EXAM: CT NECK WITH CONTRAST TECHNIQUE: Multidetector CT imaging of the neck was performed using the standard protocol following the bolus administration of intravenous contrast. RADIATION DOSE REDUCTION: This exam was performed according to the departmental dose-optimization program which includes automated exposure control, adjustment of the mA and/or kV according to patient size and/or use of iterative reconstruction technique. CONTRAST:  75mL OMNIPAQUE  IOHEXOL  350 MG/ML SOLN COMPARISON:  None Available. FINDINGS: Pharynx and larynx: There is a 2.3 x 1.3 x 2.3 cm peripherally enhancing fluid collection involving the right aspect of the supraglottic airway involving the aryepiglottic folds and paraglottic fat concern for abscess. There is associated mass effect on the airway with moderate narrowing and significant effacement of the right piriform sinus. Additional component of the collection extends along the medial and inferior aspect of the hyoid within the visceral space. The collection extends inferiorly to the level of the thyroid and extends anteriorly to midline. There is a distal component of the collection which extends into the retropharyngeal space with peripheral enhancement suggestive of retropharyngeal abscess. This portion of the abscess measures up to 4 mm in thickness. There is additional nonenhancing edema extending into the left posterolateral aspect of  the visceral space extending inferiorly along the posterolateral aspect of the left thyroid lobe with additional edema in the inferior aspect of the left carotid space. The nasopharynx is symmetric. Palatine tonsils are relatively symmetric in size with multiple calcifications suggestive of tonsilliths versus prior inflammation. There are additional peripherally enhancing collections noted along the inferior medial aspect of the right palatine tonsil measuring 0.6 x 0.5 x 0.9 cm and 0.5 x 0.6 x 0.6 cm respectively. The palate, oral cavity, and floor of mouth are unremarkable. There is thickening of the epiglottis likely reflecting reactive edema. Salivary glands: No inflammation, mass, or stone. Thyroid: Normal. Lymph nodes: Enlarged right level 2A cervical nodes measuring up to 1.2 cm in short axis. There are multiple left-sided cervical lymph nodes which measure less than 1 cm in short axis. Vascular: Mild atherosclerosis at the carotid bifurcations without evidence of high-grade stenosis. Limited intracranial: Limited visualization of intracranial structures without focal acute abnormality. Visualized orbits: Negative. Mastoids and visualized paranasal sinuses: Mild mucosal thickening in the alveolar recesses of the maxillary sinuses. Mastoid air cells are clear. Skeleton: No acute or aggressive process. Upper chest: Paraseptal emphysema in the lung apices. Additional areas of centrilobular emphysema. There is ill-defined edema within the partially visualized superior mediastinum. Other: None. IMPRESSION: Large complex abscess involving the right aspect of the supraglottic airway with involvement of the right aryepiglottic fold and paraglottic fat. Collection extends along the inferior medial aspect of the hyoid into the visceral space along the right aspect of the airway. Collection extends inferiorly to the level of the thyroid. Additional component of retropharyngeal abscess. Additional small peritonsillar  abscesses in the inferior medial aspect of the right palatine tonsil. Moderate airway narrowing of the supraglottic airway. Potential for extension of the collection to the mediastinum. There is ill-defined edema within the partially visualized superior mediastinum. Recommend correlation with dedicated chest CT. Thickening of the epiglottis likely reflecting reactive edema. Enlarged right 2A cervical node,  likely reactive. Emphysema (ICD10-J43.9). Electronically Signed: By: Donnice Mania M.D. On: 05/22/2024 15:33   DG Chest 2 View Result Date: 05/22/2024 CLINICAL DATA:  Chest pain, shortness of breath. EXAM: CHEST - 2 VIEW COMPARISON:  August 23, 2012. FINDINGS: The heart size and mediastinal contours are within normal limits. Hypoinflation of the lungs is noted with minimal bibasilar subsegmental atelectasis. The visualized skeletal structures are unremarkable. IMPRESSION: Hypoinflation of the lungs with minimal bibasilar subsegmental atelectasis. Electronically Signed   By: Lynwood Landy Raddle M.D.   On: 05/22/2024 12:35    ROS Blood pressure 100/74, pulse 83, temperature 98.5 F (36.9 C), temperature source Oral, resp. rate (!) 30, last menstrual period 10/24/2018, SpO2 98%. Physical Exam Constitutional:      Appearance: She is well-developed.  HENT:     Head: Normocephalic.     Right Ear: Ear canal normal.     Left Ear: Ear canal normal.     Mouth/Throat:     Mouth: Mucous membranes are moist.     Tonsils: 1+ on the right. 1+ on the left.     Comments: She opens her mouth normally.  She has no obvious dental problems.  There is no swelling in her oral cavity oropharynx.  Tongue is normal.  Floor mouth is normal. Eyes:     Conjunctiva/sclera: Conjunctivae normal.  Neck:     Comments: Palpation of her neck reveals a slight fullness in the hyoid area but there is no tenderness to palpation or manipulation.  No tenderness down inferior around her thyroid gland and no significant mass or  swelling.  Fiberoptic exam-she was informed risk and benefits of the procedure and options were discussed all questions were answered and consent was obtained.  The scope was passed through the right nares.  The nasopharynx was clear and normal.  Her palate looked normal.  The base of tongue had no obvious swelling.  There was fullness in the right vallecula extending along the right side of the epiglottis onto the area epiglottic fold.  There is no cyst appearance to the area and does not look like it needs immediate drainage of any sort.  The epiglottis is slightly distorted to the left but the glottis and larynx remaining is midline.  Both vocal cords move normally.  There is no swelling of the vocal cords.  They are easily visualized. Musculoskeletal:     Cervical back: Normal range of motion and neck supple.  Neurological:     Mental Status: She is alert.       Assessment/Plan: Supraglottic hypodense area-she has essentially no pain in any area of the neck.  There is slight swelling by palpation.  Her fiberoptic exam does show a fullness in the right vallecula and lateral pharynx area but there is no obvious purulence or lesions.  It does not look that impressive from the standpoint of needing I&D.  The glottis is easily visualized and both cords move normally.  Her white count is normal, she she is afebrile, and she does not look ill.  I am not completely sure what is going on with her relative to this findings but right now I think medical therapy with intravenous antibiotics and steroids based on the medical doctor's comfort level would be appropriate.  I will continue to follow but for right now I see no need for a surgical intervention for the pharyngeal findings.  Norleen Notice 05/22/2024, 8:39 PM

## 2024-05-22 NOTE — H&P (Cosign Needed Addendum)
 Hospital Admission History and Physical Service Pager: [phone]  Patient name: Sue [name] Medical record number: [id number] Date of Birth: [birth year] Age: 58 y.o. Gender: female  Primary Care Provider: Rudy Carlin LABOR, MD Consultants: Otolaryngology, cardiothoracic surgery Code Status: FULL Preferred Emergency Contact: Garnette Hint, spouse  Contact Information     Name Relation Home Work Mobile   Hint Elspeth PARAS Spouse   [phone]      Other Contacts   None on File    Chief Complaint: Ear pain, neck pain, and chest pain  Differential and Medical Decision Making Sue [name] is a 58 y.o. female presenting with ear pain, neck pain, and chest pain. Differential for this patient's presentation of this includes possible ear infection/otogenic source (per patient history, recent visit on 7/7 to urgent care describing otitis), poor dentition/oral source (recent tooth extraction by dentist in April/May, signs of gingivitis and substantial plaque on exam), sialadenitis (history of 2 months throat swelling per patient with dry mucous membranes, poor dentition), possible autoimmune/immunodeficient etiology (extremely dry mouth on exam/poor dentition, can consider Sjogren's syndrome, HIV), and malignancy (less likely based on imaging, but night sweats, recent weight loss concerning; however could be 2/2 hot flashes and loss of appetite from recent illness).  Assessment & Plan Abscess of supraglottic region Retropharyngeal abscess Mediastinitis Patient afebrile, hemodynamically stable, on room air with normal work of breathing, no increased shortness of breath.  CT neck shows supraglottic abscess, retropharyngeal abscess, small peritonsillar abscesses, moderate supraglottic airway narrowing, potential extension of collection to mediastinum.  CT chest suspicious for mediastinitis, esophageal thickening likely 2/2 inflammation.  ENT and cardiothoracic surgery consulted in the ED and  feel no surgical intervention is needed at this time. - FMTS will admit for IV antibiotic and pain management, attending Dr. McDiarmid - ENT will follow, appreciate recommendations, can formally consult cardiothoracic surgery as needed - Patient received 1 dose of Unasyn  in the ED (7/9), will continue with Zosyn  every 8 hours (7/10 - ) given potential otogenic source and need for pseudomonal coverage - Will collect MRSA swab to assess for potential MRSA involvement; can consider addition of vancomycin , especially if patient is not improving - Tylenol  1 g every 6 hours, Toradol  15 mg every 6 hours, and Dilaudid  1 mg every 4 hours as needed for severe pain - Dexamethasone  10 mg daily - Blood cultures collected, will follow - Can consider autoimmune workup - AM CBC, BMP, magnesium, HIV Emphysema lung (HCC) As seen on CT chest.  Patient does have smoking history.  Reassuringly without oxygen requirement or wheezes on exam. - Consider DuoNebs or maintenance therapy as needed, otherwise follow-up outpatient  FEN/GI: Thin liquid VTE Prophylaxis: Lovenox   Disposition: Progressive given risk of respiratory decompensation  History of Present Illness:  Sue [name] is a 58 y.o. female presenting with ear pain, neck pain, and chest pain.  She woke up on Sunday with a bad ear infection. She could not eat well. Over time, it got worse. She started feeling pain in her neck and chest which prompted her to go to the UC where she was diagnosed with an ear infection. She was given amoxicillin  and abx drops, but she is unsure if all of her pills even went down due to feeling stuck in her throat.  She continues to have pain in the neck, chest, and also on her left side. The left side pain is crampy. In addition, she has not eaten since Sunday due to food getting stuck.  She does not have an appetite with this sickness, as well. She endorses a cough where she coughs up food and phlegm from her ear. No history  of recurrent sore throat or ear infections.  No fevers, but she has been feeling hot. She also endorses severe night sweats. She has lost about 5 pounds but mostly since she could not eat. She denies sick contacts. She has had 5 episodes of ear infection in the last 10 years, all starting since she finished treatment for her breat cancer. She is not having difficulty breathing. Voice was more of a whisper earlier in the week, but this is improved. Of note, she grilled out on July 4th, and she did not clean the grill that time, but she is unsure if she used a metal bristle cleaner to clean the grill in the past.  In the ED, patient was afebrile and hemodynamically stable.  Chest x-ray showed hypoinflation of the lungs and minimal bibasilar atelectasis.  CT soft tissue neck with contrast showed retropharyngeal abscess, small peritonsillar abscesses, and moderate supraglottic airway narrowing.  CT chest with contrast suspicious for mediastinitis, mild diffuse esophageal thickening likely due to inflammation.  Review Of Systems: Per HPI   Pertinent Past Medical History: Breast cancer s/p radiation therapy  Pertinent Past Surgical History: Achilles tendon surgery Breast lumpectomy Calcaneal osteotomy Remainder reviewed in history tab.   Pertinent Social History: Tobacco use: Former, quit 1995, used to smoke 1 pack per week for about 10 years Alcohol use: Drinks 6-pack per week Other Substance use: None Lives with husband and daughter  Pertinent Family History: Mother - breast cancer Paternal cousin - breast cancer Paternal aunt - brain cancer Remainder reviewed in history tab.   Important Outpatient Medications: Amoxicillin  875 mg, prescribed Monday (7/7) Cortisporin 1% solution, prescribed Monday (7/7) Remainder reviewed in medication history.   Objective: BP 100/74   Pulse 83   Temp 98.5 F (36.9 C) (Oral)   Resp (!) 30   LMP 10/24/2018   SpO2 98%  Exam: General: Awake, alert,  no acute distress Eyes: Nonicteric ENTM: Ear canals clear bilaterally, good light reflex bilaterally, eardrums nonerythematous, nonbulging, poor dentition, no exudates, possible Wickham striae on buccal mucosa bilaterally Neck: Bilateral mild submandibular swelling Cardiovascular: RRR, no M/R/G Respiratory: CTAB, normal work of breathing on room air, speaking comfortably without significant muffling, no stridor Gastrointestinal: Flat, soft, nontender Derm: No skin rashes or lesions noted Neuro: No gross focal deficits Psych: Appropriate mood and affect  Labs:  CBC BMET  Recent Labs  Lab 05/22/24 1133  WBC 5.9  HGB 13.4  HCT 40.2  PLT 155   Recent Labs  Lab 05/22/24 1133  NA 135  K 3.2*  CL 103  CO2 18*  BUN 22*  CREATININE 0.82  GLUCOSE 138*  CALCIUM 9.3     LA 1.3>1.5  EKG: Pending  Imaging Studies Performed:  My Interpretation:  Chest x-ray: No focal consolidation  Impression from Radiologist: Chest x-ray: Hypoinflation of the lungs with minimal bibasilar subsegmental atelectasis.  CT soft tissue neck with contrast: Large complex abscess involving the right aspect of the supraglottic airway with involvement of the right aryepiglottic fold and paraglottic fat. Collection extends along the inferior medial aspect of the hyoid into the visceral space along the right aspect of the airway. Collection extends inferiorly to the level of the thyroid. Additional component of retropharyngeal abscess. Additional small peritonsillar abscesses in the inferior medial aspect of the right palatine tonsil. Moderate airway narrowing of  the supraglottic airway. Potential for extension of the collection to the mediastinum. There is ill-defined edema within the partially visualized superior mediastinum. Recommend correlation with dedicated chest CT. Thickening of the epiglottis likely reflecting reactive edema. Enlarged right 2A cervical node, likely reactive. Emphysema  (ICD10-J43.9).  CT chest with contrast: 1. Constellation of findings suspicious for diffuse mediastinitis/mediastinal infection as described above. Small complex slightly organizing left posterior mediastinal fluid collection suspicious for abscess, though no internal gas at this time. Additional fluid along the right aspect of the distal esophagus without definitive rim enhancement, but difficult to exclude small developing abscess here as well. Mild mediastinal adenopathy. 2. Mild diffuse circumferential esophageal thickening, presumably due to inflammation from diffuse inflammatory mediastinal process. No focal air collections or gas within the mediastinum to suggest perforation. 3. Emphysema. 4. Aortic atherosclerosis.  Sue Raguel MATSU, DO 05/22/2024, 9:02 PM PGY-1, [facility]  FPTS Intern pager: [phone], text pages welcome Secure chat group [facility] Teaching Service   I agree with the assessment and plan as documented above.  Stuart Redo, MD PGY-3, [facility]

## 2024-05-22 NOTE — ED Triage Notes (Signed)
 Pt. Stated, I went to UC for ear and throat pain and I think its gone into my back and chest. Im still no better. The pill are barely going down my throat.

## 2024-05-23 ENCOUNTER — Inpatient Hospital Stay (HOSPITAL_COMMUNITY)

## 2024-05-23 ENCOUNTER — Other Ambulatory Visit: Payer: Self-pay | Admitting: Physician Assistant

## 2024-05-23 DIAGNOSIS — J853 Abscess of mediastinum: Secondary | ICD-10-CM | POA: Diagnosis not present

## 2024-05-23 DIAGNOSIS — J390 Retropharyngeal and parapharyngeal abscess: Secondary | ICD-10-CM | POA: Diagnosis not present

## 2024-05-23 DIAGNOSIS — J9851 Mediastinitis: Secondary | ICD-10-CM | POA: Diagnosis not present

## 2024-05-23 DIAGNOSIS — R809 Proteinuria, unspecified: Secondary | ICD-10-CM | POA: Insufficient documentation

## 2024-05-23 DIAGNOSIS — I3139 Other pericardial effusion (noninflammatory): Secondary | ICD-10-CM | POA: Diagnosis not present

## 2024-05-23 DIAGNOSIS — J387 Other diseases of larynx: Secondary | ICD-10-CM

## 2024-05-23 DIAGNOSIS — J36 Peritonsillar abscess: Secondary | ICD-10-CM

## 2024-05-23 DIAGNOSIS — R9431 Abnormal electrocardiogram [ECG] [EKG]: Secondary | ICD-10-CM | POA: Insufficient documentation

## 2024-05-23 DIAGNOSIS — I319 Disease of pericardium, unspecified: Secondary | ICD-10-CM | POA: Diagnosis not present

## 2024-05-23 DIAGNOSIS — Z853 Personal history of malignant neoplasm of breast: Secondary | ICD-10-CM

## 2024-05-23 LAB — CBC
HCT: 35.6 % — ABNORMAL LOW (ref 36.0–46.0)
Hemoglobin: 11.9 g/dL — ABNORMAL LOW (ref 12.0–15.0)
MCH: 32.7 pg (ref 26.0–34.0)
MCHC: 33.4 g/dL (ref 30.0–36.0)
MCV: 97.8 fL (ref 80.0–100.0)
Platelets: 153 K/uL (ref 150–400)
RBC: 3.64 MIL/uL — ABNORMAL LOW (ref 3.87–5.11)
RDW: 13.5 % (ref 11.5–15.5)
WBC: 2.8 K/uL — ABNORMAL LOW (ref 4.0–10.5)
nRBC: 0 % (ref 0.0–0.2)

## 2024-05-23 LAB — BASIC METABOLIC PANEL WITH GFR
Anion gap: 9 (ref 5–15)
BUN: 31 mg/dL — ABNORMAL HIGH (ref 6–20)
CO2: 21 mmol/L — ABNORMAL LOW (ref 22–32)
Calcium: 8.8 mg/dL — ABNORMAL LOW (ref 8.9–10.3)
Chloride: 101 mmol/L (ref 98–111)
Creatinine, Ser: 0.98 mg/dL (ref 0.44–1.00)
GFR, Estimated: >60 mL/min (ref >60)
Glucose, Bld: 129 mg/dL — ABNORMAL HIGH (ref 70–99)
Potassium: 3.3 mmol/L — ABNORMAL LOW (ref 3.5–5.1)
Sodium: 131 mmol/L — ABNORMAL LOW (ref 135–145)

## 2024-05-23 LAB — URINALYSIS, W/ REFLEX TO CULTURE (INFECTION SUSPECTED)
Bilirubin Urine: NEGATIVE
Glucose, UA: NEGATIVE mg/dL
Hgb urine dipstick: NEGATIVE
Ketones, ur: NEGATIVE mg/dL
Leukocytes,Ua: NEGATIVE
Nitrite: NEGATIVE
Protein, ur: 100 mg/dL — AB
Specific Gravity, Urine: 1.02 (ref 1.005–1.030)
pH: 6 (ref 5.0–8.0)

## 2024-05-23 LAB — TROPONIN I (HIGH SENSITIVITY)
Troponin I (High Sensitivity): 4 ng/L (ref <18)
Troponin I (High Sensitivity): 4 ng/L (ref <18)

## 2024-05-23 LAB — ECHOCARDIOGRAM COMPLETE
Area-P 1/2: 3.91 cm2
Height: 69 in
S' Lateral: 3.7 cm
Weight: 2720 [oz_av]

## 2024-05-23 LAB — HEMOGLOBIN A1C
Hgb A1c MFr Bld: 5.4 % (ref 4.8–5.6)
Mean Plasma Glucose: 108 mg/dL

## 2024-05-23 LAB — MAGNESIUM: Magnesium: 2 mg/dL (ref 1.7–2.4)

## 2024-05-23 LAB — HIV ANTIBODY (ROUTINE TESTING W REFLEX): HIV Screen 4th Generation wRfx: NONREACTIVE

## 2024-05-23 LAB — MRSA NEXT GEN BY PCR, NASAL: MRSA by PCR Next Gen: NOT DETECTED

## 2024-05-23 LAB — PATHOLOGIST SMEAR REVIEW

## 2024-05-23 MED ORDER — CHLORHEXIDINE GLUCONATE 0.12 % MT SOLN
15.0000 mL | Freq: Two times a day (BID) | OROMUCOSAL | Status: DC
  Administered 2024-05-23 – 2024-05-25 (×5): 15 mL via OROMUCOSAL
  Filled 2024-05-23 (×6): qty 15

## 2024-05-23 MED ORDER — POTASSIUM CHLORIDE 10 MEQ/100ML IV SOLN
10.0000 meq | INTRAVENOUS | Status: AC
  Administered 2024-05-23 (×2): 10 meq via INTRAVENOUS
  Filled 2024-05-23 (×2): qty 100

## 2024-05-23 MED ORDER — VANCOMYCIN HCL 1500 MG/300ML IV SOLN
1500.0000 mg | Freq: Once | INTRAVENOUS | Status: AC
  Administered 2024-05-23: 1500 mg via INTRAVENOUS
  Filled 2024-05-23: qty 300

## 2024-05-23 MED ORDER — POTASSIUM CHLORIDE 20 MEQ PO PACK
40.0000 meq | PACK | Freq: Once | ORAL | Status: AC
  Administered 2024-05-23: 40 meq via ORAL
  Filled 2024-05-23: qty 2

## 2024-05-23 MED ORDER — VANCOMYCIN HCL IN DEXTROSE 1-5 GM/200ML-% IV SOLN: 1000.0000 mg | Freq: Two times a day (BID) | INTRAVENOUS | Status: DC

## 2024-05-23 MED ORDER — SODIUM CHLORIDE 0.9 % IV SOLN
3.0000 g | Freq: Four times a day (QID) | INTRAVENOUS | Status: DC
  Administered 2024-05-23 – 2024-05-25 (×9): 3 g via INTRAVENOUS
  Filled 2024-05-23 (×10): qty 8

## 2024-05-23 NOTE — Progress Notes (Signed)
 Daily Progress Note Intern Pager: [phone]  Patient name: Sue [name] Medical record number: [id number] Date of birth: [birth year] Age: 58 y.o. Gender: female  Primary Care Provider: Rudy Carlin LABOR, MD Consultants: ENT, Cardiothoracic surgery, ID Code Status: FULL  Pt Overview and Major Events to Date:  - 7/9: Pt admitted  Medical Decision Making:  Sue [name] is a 58 y.o. female presenting with ear, neck, and chest pain 2/2 retropharyngeal abscess, supraglottic abscess, and mediastinitis. ENT and cardiothoracic surgery consulted in the ED on 7/9 and felt no surgical intervention needed at that time.  Suspect presentation is stemming from dental vs otogenic source. Pt with hx of poor dentition and recent tooth extraction in spring 2025. Hx R ear pain on 7/7 concerning for infection, though ears normal on exam. Also considering autoimmune etiology like Sjogren's syndrome given hx dry mouth. Blood cultures with no growth < 12 hours.  ID consult: Dr. Fleeta Rothman Switch IV Zosyn  to Unasyn  so we can later step down to Augmentin  Discontinue vanc  Will see her later and decide on schedule for repeat CTs to follow infection  Pertinent PMH/PSH includes hx breast cancer, remote hx tobacco use.  Assessment & Plan Abscess of supraglottic region Retropharyngeal abscess Mediastinitis Pt doing well, improved clinically. Per ID, will adjust abx as above.  - Abx: IV Unasyn  (per pharmacy) (7/10-) - Pain: IV ketorolac  15 mg q6h, IV acetaminophen  1000 mg q6h, - Stop IV dilaudid  1 mg q4h prn; pt has not requested this at all this admission  - IV Dexamethasone  10 mg daily - Consider dc tomorrow 7/11 - Labs: AM CBC, BMP - Ordering Panorex to evaluate dentition - Ordering chlorhexidine  oral rinses twice daily - Continue to follow blood cultures - ENT following, appreciate recommendations - Consider cardiothoracic surgery formal consult as indicated Abnormal EKG Abnormal EKG with  nonspecific ST abnormality early on 7/10. Workup pursued: trops neg and flat, repeat EKG sinus and largely unchanged from prior. Trace pericardial effusion on CT chest. Given CT findings alongside EKG in picture of widespread infection within chest, concern for pericardial effusion. - Echo ordered.  - Cardiology consulted for additional read of EKGs Proteinuria Urinalysis this morning with protein 100 protein. Cr stable ha1c pending; Cr stable at 0.98 (from 0.82). Low concern for kidney disease at this time. Consider pre/diabetes - Ha1c pending - Repeat urinalysis outpatient Emphysema lung (HCC) CT chest showing same; pt with remote smoking history. No dyspnea, normal lung sounds today. No further evaluation needed while inpatient. - Pursue outpatient workup   FEN/GI: Thin liquid diet PPx: Lovenox  Dispo:Home pending clinical improvement .  Subjective:  Today, pt reports she is feeling much better with no current pain or trouble breathing. Pt has also been able to swallow her current liquid-based diet.  Objective: Temp:  [98.2 F (36.8 C)-98.6 F (37 C)] 98.2 F (36.8 C) (07/10 0159) Pulse Rate:  [80-109] 84 (07/10 0500) Resp:  [18-30] 18 (07/10 0500) BP: (97-128)/(55-82) 107/76 (07/10 0500) SpO2:  [95 %-99 %] 98 % (07/10 0500) Weight:  [77.1 kg] 77.1 kg (07/10 0200) Physical Exam: General: Pt lying back in bed with head of bed elevated, no acute distress. Ears: Normal TMs bilaterally. Normal ear canals bilaterally. Neck: Mild edema of Right neck, no tenderness. No overlying skin changes. Cardiovascular: Regular rate and rhythm, no murmurs/rubs/gallops. Respiratory: Normal work of breathing on room air. Clear to auscultation bilaterally; no wheezes, crackles. Abdomen: Bowel sounds present and normoactive bilaterally. Soft, nondistended, nontender.  Extremities: Skin warm, dry. No bilateral lower extremity edema. Neuro: Alert, answering questions appropriately. No focal neurological  deficits.  Laboratory: Most recent CBC Lab Results  Component Value Date   WBC 2.8 (L) 05/23/2024   HGB 11.9 (L) 05/23/2024   HCT 35.6 (L) 05/23/2024   MCV 97.8 05/23/2024   PLT 153 05/23/2024   Most recent BMP    Latest Ref Rng & Units 05/23/2024    4:36 AM  BMP  Glucose 70 - 99 mg/dL 870   BUN 6 - 20 mg/dL 31   Creatinine 9.55 - 1.00 mg/dL 9.01   Sodium 864 - 854 mmol/L 131   Potassium 3.5 - 5.1 mmol/L 3.3   Chloride 98 - 111 mmol/L 101   CO2 22 - 32 mmol/L 21   Calcium 8.9 - 10.3 mg/dL 8.8   Mag 2.0  Trop 4 > 4  Urinalysis: Protein 100; negative nitrites and leukocytes  Ha1c: pending HIV antibody: pending   Larraine Palma, MD 05/23/2024, 7:04 AM  PGY-1, [place] [facility] FPTS Intern pager: [phone], text pages welcome Secure chat group [facility] Teaching Service

## 2024-05-23 NOTE — Progress Notes (Signed)
 Pharmacy Antibiotic Note  Sue [name] is a 58 y.o. female admitted on 05/22/2024 with supraglottic abscess .  Pharmacy has been consulted for Vancomycin  dosing.  Plan: Vancomycin  1500 mg IV now, then Vancomycin  1000 mg IV q12h  Height: 5' 9 (175.3 cm) Weight: 77.1 kg (170 lb) IBW/kg (Calculated) : 66.2  Temp (24hrs), Avg:98.4 F (36.9 C), Min:98.2 F (36.8 C), Max:98.6 F (37 C)  Recent Labs  Lab 05/22/24 1133 05/22/24 1142 05/22/24 1345 05/23/24 0436  WBC 5.9  --   --  2.8*  CREATININE 0.82  --   --  0.98  LATICACIDVEN  --  1.3 1.5  --     Estimated Creatinine Clearance: 66.2 mL/min (by C-G formula based on SCr of 0.98 mg/dL).    No Known Allergies   Sue [name] 05/23/2024 6:54 AM

## 2024-05-23 NOTE — Assessment & Plan Note (Addendum)
 CT chest showing same; pt with remote smoking history. No dyspnea, normal lung sounds today. No further evaluation needed while inpatient. - Pursue outpatient workup

## 2024-05-23 NOTE — Plan of Care (Signed)
  Problem: Education: Goal: Knowledge of General Education information will improve Description: Including pain rating scale, medication(s)/side effects and non-pharmacologic comfort measures Outcome: Progressing   Problem: Clinical Measurements: Goal: Ability to maintain clinical measurements within normal limits will improve Outcome: Progressing Goal: Diagnostic test results will improve Outcome: Progressing Goal: Respiratory complications will improve Outcome: Progressing Goal: Cardiovascular complication will be avoided Outcome: Progressing   Problem: Activity: Goal: Risk for activity intolerance will decrease Outcome: Progressing   Problem: Nutrition: Goal: Adequate nutrition will be maintained Outcome: Progressing   Problem: Elimination: Goal: Will not experience complications related to bowel motility Outcome: Progressing Goal: Will not experience complications related to urinary retention Outcome: Progressing   Problem: Pain Managment: Goal: General experience of comfort will improve and/or be controlled Outcome: Progressing   Problem: Safety: Goal: Ability to remain free from injury will improve Outcome: Progressing   Problem: Skin Integrity: Goal: Risk for impaired skin integrity will decrease Outcome: Progressing

## 2024-05-23 NOTE — Assessment & Plan Note (Addendum)
 Pt doing well, improved clinically. Per ID, will adjust abx as above.  - Abx: IV Unasyn  (per pharmacy) (7/10-) - Pain: IV ketorolac  15 mg q6h, IV acetaminophen  1000 mg q6h, - Stop IV dilaudid  1 mg q4h prn; pt has not requested this at all this admission  - IV Dexamethasone  10 mg daily - Consider dc tomorrow 7/11 - Labs: AM CBC, BMP - Ordering Panorex to evaluate dentition - Ordering chlorhexidine  oral rinses twice daily - Continue to follow blood cultures - ENT following, appreciate recommendations - Consider cardiothoracic surgery formal consult as indicated

## 2024-05-23 NOTE — Consult Note (Signed)
 Date of Admission:  05/22/2024          Reason for Consult:  Complex abscess right aspect the supraglottic airway with peritonsillar abscesses and extension into the hyoid with mediastinal abscess and pericarditis   Referring Provider: Krystal McDiarmid, MD   Assessment:  Complex abscess adjacent to the supraglottic airway with peritonsillar abscesses and extension into the hyoid with mediastinal abscess and pericarditis History of breast cancer  Plan:  DC vancomycin  Change Zosyn  for Unasyn  and if she does well on Unasyn  we will be ultimately able to stepdown to Augmentin  Defer to ENT with regards to duration of steroids Standard precautions  Principal Problem:   Abscess of supraglottic region Active Problems:   Acute mediastinitis   Retropharyngeal abscess   Emphysema lung (HCC)   Abnormal EKG   Proteinuria   Scheduled Meds:  chlorhexidine   15 mL Mouth/Throat BID   dexamethasone  (DECADRON ) injection  10 mg Intravenous Q24H   enoxaparin  (LOVENOX ) injection  40 mg Subcutaneous QHS   ketorolac   15 mg Intravenous Q6H   Continuous Infusions:  acetaminophen  1,000 mg (05/23/24 1718)   ampicillin -sulbactam (UNASYN ) IV 3 g (05/23/24 1512)   PRN Meds:.  HPI: Sue [name] is a 58 y.o. female with history of breast cancer who had been in her usual state of health until this past Sunday where she developed pain in her right ear which was something new she also had noticed pain when she swallowed liquids at that time.  Her coworkers at the [facility] department were concerned by how she was doing and ultimately she did go to the urgent care where she was diagnosed with otitis externa and prescribed drops as well as amoxicillin .  She took a couple of doses of amoxicillin  but continued to have worsenin symptoms with swelling of her neck and difficulty swallowing.  She was seen in the ER and CT scan was done of the neck which shows  IMPRESSION: Large complex  abscess involving the right aspect of the supraglottic airway with involvement of the right aryepiglottic fold and paraglottic fat. Collection extends along the inferior medial aspect of the hyoid into the visceral space along the right aspect of the airway. Collection extends inferiorly to the level of the thyroid. Additional component of retropharyngeal abscess.   Additional small peritonsillar abscesses in the inferior medial aspect of the right palatine tonsil.   Moderate airway narrowing of the supraglottic airway.   Potential for extension of the collection to the mediastinum. There is ill-defined edema within the partially visualized superior mediastinum.  CT of the chest showed:  IMPRESSION: 1. Constellation of findings suspicious for diffuse mediastinitis/mediastinal infection as described above. Small complex slightly organizing left posterior mediastinal fluid collection suspicious for abscess, though no internal gas at this time. Additional fluid along the right aspect of the distal esophagus without definitive rim enhancement, but difficult to exclude small developing abscess here as well. Mild mediastinal adenopathy. 2. Mild diffuse circumferential esophageal thickening, presumably due to inflammation from diffuse inflammatory mediastinal process. No focal air collections or gas within the mediastinum to suggest perforation. 3. Emphysema. 4. Aortic atherosclerosis  She has also been found to have pericarditis.  She has been seen by ENT and cardiothoracic surgery who do not feel there is a need for surgical intervention.  He has had clinical improvement while on vancomycin  Zosyn  and Decadron .  I had lengthy discussion with her and then also later with her husband when he  came to visit about oral versus IV antibiotics the patient has a strong preference for oral antibiotics and I think we can do this with oral antibiotics but we need to enable this by pivoting to an  antibiotic and an intravenous form that has an essential oral equivalent.  I think changing her to Unasyn  is the way to go.  While she did get a couple of doses of amoxicillin  that were not many and this was amoxicillin  without clavulanic acid to deal with the beta-lactamase is that many of the anaerobes that are likely involved in her pathology.  If she does well on Unasyn  we can ultimately change her to Augmentin .  She is going to need a lengthy course of antibiotics with repeat CT scan of the neck and chest in a month --provided she is doing well.   I have personally spent 80 minutes involved in face-to-face and non-face-to-face activities for this patient on the day of the visit. Professional time spent includes the following activities: Preparing to see the patient (review of tests), Obtaining and/or reviewing separately obtained history (admission/discharge record), Performing a medically appropriate examination and/or evaluation , Ordering medications/tests/procedures, referring and communicating with other health care professionals, Documenting clinical information in the EMR, Independently interpreting results (not separately reported), Communicating results to the patient/family/caregiver, Counseling and educating the patient/family/caregiver and Care coordination (not separately reported).   Evaluation of the patient requires complex antimicrobial therapy evaluation, counseling , isolation needs to reduce disease transmission and risk assessment and mitigation.     Review of Systems: Review of Systems  Constitutional:  Positive for malaise/fatigue. Negative for chills, fever and weight loss.  HENT:  Positive for ear pain and sore throat. Negative for congestion.   Eyes:  Negative for blurred vision and photophobia.  Respiratory:  Positive for shortness of breath. Negative for cough and wheezing.   Cardiovascular:  Negative for chest pain, palpitations and leg swelling.   Gastrointestinal:  Negative for abdominal pain, blood in stool, constipation, diarrhea, heartburn, melena, nausea and vomiting.  Genitourinary:  Negative for dysuria, flank pain and hematuria.  Musculoskeletal:  Negative for back pain, falls, joint pain and myalgias.  Skin:  Negative for itching and rash.  Neurological:  Negative for dizziness, focal weakness, loss of consciousness, weakness and headaches.  Endo/Heme/Allergies:  Does not bruise/bleed easily.  Psychiatric/Behavioral:  Negative for depression and suicidal ideas. The patient does not have insomnia.     Past Medical History:  Diagnosis Date   Breast cancer (HCC) 08/28/2012   Left Breast   Hemorrhoids    Personal history of radiation therapy    Right foot pain    S/P radiation therapy 12/06/12 -01/23/13   Left Breast/Axilla / 46 Gy / 23 Fractions with a Boost to Left Breast / 14 Gy / 7 Fractions   Use of tamoxifen  (Nolvadex ) 01/2013   Wears glasses     Social History   Tobacco Use   Smoking status: Former    Current packs/day: 0.00    Types: Cigarettes    Quit date: 11/30/1993    Years since quitting: 30.4   Smokeless tobacco: Never  Vaping Use   Vaping status: Never Used  Substance Use Topics   Alcohol use: Not Currently    Comment: 1x per week   Drug use: No    Family History  Problem Relation Age of Onset   Breast cancer Mother 57       blood cancer too   Brain cancer Paternal Aunt  diagnosed in late 26s to early 32s   Breast cancer Cousin        paternal cousin diagnosed; diagnosed in her late 73s   Colon cancer Neg Hx    Colon polyps Neg Hx    Esophageal cancer Neg Hx    Rectal cancer Neg Hx    Stomach cancer Neg Hx    No Known Allergies  OBJECTIVE: Blood pressure 123/81, pulse 86, temperature 98.5 F (36.9 C), temperature source Oral, resp. rate (!) 22, height 5' 9 (1.753 m), weight 77.1 kg, last menstrual period 10/24/2018, SpO2 97%.  Physical Exam Constitutional:      General: She  is not in acute distress.    Appearance: Normal appearance. She is well-developed. She is not ill-appearing or diaphoretic.  HENT:     Head: Normocephalic and atraumatic.     Right Ear: Hearing and external ear normal.     Left Ear: Hearing and external ear normal.     Nose: No nasal deformity or rhinorrhea.  Eyes:     General: No scleral icterus.    Conjunctiva/sclera: Conjunctivae normal.     Right eye: Right conjunctiva is not injected.     Left eye: Left conjunctiva is not injected.     Pupils: Pupils are equal, round, and reactive to light.  Neck:     Comments: Swelling and edema that have reportedly improved Cardiovascular:     Rate and Rhythm: Normal rate and regular rhythm.     Heart sounds: Normal heart sounds, S1 normal and S2 normal. No murmur heard.    No friction rub.  Abdominal:     General: Bowel sounds are normal. There is no distension.     Palpations: Abdomen is soft.     Tenderness: There is no abdominal tenderness.  Musculoskeletal:        General: Normal range of motion.     Right shoulder: Normal.     Left shoulder: Normal.     Right hip: Normal.     Left hip: Normal.     Right knee: Normal.     Left knee: Normal.  Lymphadenopathy:     Head:     Right side of head: No submandibular, preauricular or posterior auricular adenopathy.     Left side of head: No submandibular, preauricular or posterior auricular adenopathy.  Skin:    General: Skin is warm and dry.     Coloration: Skin is not pale.     Findings: No abrasion, bruising, ecchymosis, erythema, lesion or rash.     Nails: There is no clubbing.  Neurological:     Mental Status: She is alert and oriented to person, place, and time.     Sensory: No sensory deficit.     Coordination: Coordination normal.     Gait: Gait normal.  Psychiatric:        Attention and Perception: She is attentive.        Speech: Speech normal.        Behavior: Behavior normal. Behavior is cooperative.        Thought  Content: Thought content normal.        Judgment: Judgment normal.     Lab Results Lab Results  Component Value Date   WBC 2.8 (L) 05/23/2024   HGB 11.9 (L) 05/23/2024   HCT 35.6 (L) 05/23/2024   MCV 97.8 05/23/2024   PLT 153 05/23/2024    Lab Results  Component Value Date   CREATININE 0.98 05/23/2024  BUN 31 (H) 05/23/2024   NA 131 (L) 05/23/2024   K 3.3 (L) 05/23/2024   CL 101 05/23/2024   CO2 21 (L) 05/23/2024    Lab Results  Component Value Date   ALT 12 05/22/2024   AST 21 05/22/2024   ALKPHOS 76 05/22/2024   BILITOT 1.0 05/22/2024     Microbiology: Recent Results (from the past 240 hours)  MRSA Next Gen by PCR, Nasal     Status: None   Collection Time: 05/22/24 10:06 PM   Specimen: Nasal Mucosa; Nasal Swab  Result Value Ref Range Status   MRSA by PCR Next Gen NOT DETECTED NOT DETECTED Final    Comment: (NOTE) The GeneXpert MRSA Assay (FDA approved for NASAL specimens only), is one component of a comprehensive MRSA colonization surveillance program. It is not intended to diagnose MRSA infection nor to guide or monitor treatment for MRSA infections. Test performance is not FDA approved in patients less than 44 years old. Performed at [facility], [address], [place], [place] [address]   Culture, blood (Routine X 2) w Reflex to ID Panel     Status: None (Preliminary result)   Collection Time: 05/23/24 12:11 AM   Specimen: BLOOD  Result Value Ref Range Status   Specimen Description BLOOD RIGHT ANTECUBITAL  Final   Special Requests   Final    BOTTLES DRAWN AEROBIC AND ANAEROBIC Blood Culture adequate volume   Culture   Final    NO GROWTH < 12 HOURS Performed at [facility], [address] [place], [place] [address]    Report Status PENDING  Incomplete  Culture, blood (Routine X 2) w Reflex to ID Panel     Status: None (Preliminary result)   Collection Time: 05/23/24 12:12 AM   Specimen: BLOOD LEFT HAND  Result Value Ref Range Status    Specimen Description BLOOD LEFT HAND  Final   Special Requests   Final    BOTTLES DRAWN AEROBIC AND ANAEROBIC Blood Culture adequate volume   Culture   Final    NO GROWTH < 12 HOURS Performed at [facility], [address] [place], [place] [address]    Report Status PENDING  Incomplete    Jomarie Fleeta Rothman, MD [facility] [phone] pager  05/23/2024, 5:31 PM

## 2024-05-23 NOTE — Assessment & Plan Note (Addendum)
 Abnormal EKG with nonspecific ST abnormality early on 7/10. Workup pursued: trops neg and flat, repeat EKG sinus and largely unchanged from prior. Trace pericardial effusion on CT chest. Given CT findings alongside EKG in picture of widespread infection within chest, concern for pericardial effusion. - Echo ordered.  - Cardiology consulted for additional read of EKGs

## 2024-05-23 NOTE — ED Notes (Signed)
 CCMD called and patient placed on monitor

## 2024-05-23 NOTE — Progress Notes (Signed)
 Patient ID: Sue [name], female   DOB: 09/15/1966, 58 y.o.   MRN: [id number]  She is better today. She has no breathing issue. She is now able to swallow. She has no pain.  Exam the swelling in the neck is decreased. She has good movement of the neck. No hoarseness. No stridor  Will continue IV abx and check tomorrow

## 2024-05-23 NOTE — Progress Notes (Signed)
 Echocardiogram 2D Echocardiogram has been performed.  Sue [name], RDCS 05/23/2024, 1:53 PM

## 2024-05-23 NOTE — Progress Notes (Signed)
 Pharmacy Antibiotic Note  Sue [name] is a 58 y.o. female admitted on 05/22/2024 with supraglottic and retropharyngeal abcessess, mediastinitis.  Pharmacy has been consulted for Unasyn  dosing; transitioning from vancomycin  and Zosyn .  She is afebrile, WBC down to 2.8, renal function stable, and blood culture ngtd thus far. ID consulted.  Plan: Unasyn  3 g IV q6h Pharmacy signing off consult but will continue to follow peripherally   Height: 5' 9 (175.3 cm) Weight: 77.1 kg (170 lb) IBW/kg (Calculated) : 66.2  Temp (24hrs), Avg:98.2 F (36.8 C), Min:97.9 F (36.6 C), Max:98.5 F (36.9 C)  Recent Labs  Lab 05/22/24 1133 05/22/24 1142 05/22/24 1345 05/23/24 0436  WBC 5.9  --   --  2.8*  CREATININE 0.82  --   --  0.98  LATICACIDVEN  --  1.3 1.5  --     Estimated Creatinine Clearance: 66.2 mL/min (by C-G formula based on SCr of 0.98 mg/dL).    No Known Allergies   Thank you for involving pharmacy in this patient's care.  Delon Sax, PharmD, BCPS Clinical Pharmacist Clinical phone for 05/23/2024 is [phone] 05/23/2024 2:28 PM

## 2024-05-23 NOTE — Assessment & Plan Note (Signed)
 Urinalysis this morning with protein 100 protein. Cr stable ha1c pending; Cr stable at 0.98 (from 0.82). Low concern for kidney disease at this time. Consider pre/diabetes - Ha1c pending - Repeat urinalysis outpatient

## 2024-05-23 NOTE — Consult Note (Addendum)
 Cardiology Consultation   Patient ID: Sue [name] MRN: [id number]; DOB: 07/12/1966  Admit date: 05/22/2024 Date of Consult: 05/23/2024  PCP:  Sue [name] LABOR, MD   [facility] Providers Cardiologist:  New to [facility]   Patient Profile: Sue [name] is a 58 y.o. female with a hx of left breast cancer s/p radiation therapy and lumpectomy treated with tamoxifen  who is being seen 05/23/2024 for the evaluation of pericarditis at the request of Sue [name].  History of Present Illness: Sue [name] is a 58 year old female with past medical history of left breast cancer s/p radiation therapy and lumpectomy treated with tamoxifen .  She is not on any medication at home.  Mother died of breast cancer as well.  Father still living however dealing with back issue.  She smoked remotely when she was in college, however quit in 1995.  She denies any illicit drug use or marijuana.  She drinks alcohol socially.  According to the patient, she underwent dental procedure in April due to jaw pain.  On Sunday 05/19/2024, she started having throat pain and neck pain.  She was seen at local urgent care and was diagnosed with otalgia and given a course of ibuprofen  and amoxicillin .  Symptoms worsened by Wednesday prompting the patient to seek urgent medical attention at [facility].  On arrival, potassium was low at 3.2, sodium normal, creatinine 0.82.  White blood cell count and red blood cell count normal.  Chest x-ray showed hypoinflation of the lungs with minimal bibasilar subsegmental atelectasis.  CT of the neck tissue revealed large complex abscess involving the right aspect of the supraglottic airway with involvement of the right aryepiglottic fold and the paraglottic fat.  Collection extends along the inferior medial aspect of the hyoid into the visceral space along the right aspect of the airway.  There was an additional component of pharyngeal abscess.  Small peritonsillar abscess in  the inferior medial aspect of the right palatine tonsil.  Potential extension of the collection into the mediastinum.  CT of the chest showed a constellation of findings suspicious for diffuse mediastinitis/mediastinal infection, there was a small complex slightly organized left posterior mediastinal fluid collection suspicious for abscess.  There was also mild diffuse circumferential esophageal thickening, emphysema and aortic atherosclerosis.  Per patient, she has not been able to lay completely flat for few days.  She feels her chest discomfort is worse when she is laying flat and worse when she is taking a deep breath.  EKG showed diffuse ST elevation in the inferolateral leads.  Patient has been treated with Decadron , IV antibiotic Unasyn , and Toradol .  Cardiology service consulted for likely pericarditis in the setting of diffuse multifocal abscess and inflammation.   Past Medical History:  Diagnosis Date   Breast cancer (HCC) 08/28/2012   Left Breast   Hemorrhoids    Personal history of radiation therapy    Right foot pain    S/P radiation therapy 12/06/12 -01/23/13   Left Breast/Axilla / 46 Gy / 23 Fractions with a Boost to Left Breast / 14 Gy / 7 Fractions   Use of tamoxifen  (Nolvadex ) 01/2013   Wears glasses     Past Surgical History:  Procedure Laterality Date   ACHILLES TENDON SURGERY Right 06/02/2020   Procedure: ACHILLES LENGTHENING/KIDNER;  Surgeon: Elsa Lonni SAUNDERS, MD;  Location: [facility];  Service: Orthopedics;  Laterality: Right;   BREAST LUMPECTOMY Left    takes tamoxifen    CALCANEAL OSTEOTOMY Right 06/02/2020  Procedure: RIGHT LATERAL DISPLACEMENT CALCANEAL OSTEOTOMY, FLEXOR DIGITORUM LONGUS TRANSFER, SPRING LIGAMENT RECONSTRUCTION, POSTERIOR TIBIAL TENDON DEBRIDEMENT, DEEP ORTHOPEDIC HARDWARE REMOVAL, MEDIAL CUNEIFORM PLANTAR FLEXION OSTEOTOMY AND ACHILLES LENGTHENING, PARTIAL RESECTION OF NAVICULAR;  Surgeon: Elsa Lonni SAUNDERS, MD;  Location: [facility];  Service: Orthopedics;  Latera   DILATION AND CURETTAGE OF UTERUS     Following Miscarriage   FOOT FUSION  3/09   ankle rt   HARDWARE REMOVAL Right 06/02/2020   Procedure: HARDWARE REMOVAL;  Surgeon: Elsa Lonni SAUNDERS, MD;  Location: [facility];  Service: Orthopedics;  Laterality: Right;   Left Breast Lumpectomy  08/28/12   Left Breast Needle Core Biopsy  07/26/12   UOQ - Ductal Carcinoma In Situ with Necrosis. Microcalcifications Identified   METATARSAL OSTEOTOMY Right 06/02/2020   Procedure: METATARSAL OSTEOTOMY;  Surgeon: Elsa Lonni SAUNDERS, MD;  Location: [facility];  Service: Orthopedics;  Laterality: Right;   RE-EXCISION OF BREAST CANCER,SUPERIOR MARGINS  10/30/2012   Procedure: RE-EXCISION OF BREAST CANCER,SUPERIOR MARGINS;  Surgeon: Elon CHRISTELLA Pacini, MD;  Location: [facility];  Service: General;  Laterality: N/A;  left partial mastectomy with excision of margins   re-excision of left breast cancer on 09/10/12       Home Medications:  Prior to Admission medications   Medication Sig Start Date End Date Taking? Authorizing Provider  amoxicillin  (AMOXIL ) 875 MG tablet Take 1 tablet (875 mg total) by mouth 2 (two) times daily. 05/20/24  Yes Iola Lukes, FNP  ibuprofen  (ADVIL ) 800 MG tablet Take 1 tablet (800 mg total) by mouth every 8 (eight) hours as needed (pain). Take with food to avoid stomach upset. Do not take any additional NSAIDs while on this. You may take tylenol  in addition to this if needed for extra pain relief. 05/20/24  Yes Iola Lukes, FNP  NEOMYCIN -POLYMYXIN-HYDROCORTISONE (CORTISPORIN) 1 % SOLN OTIC solution Place 3 drops into the right ear in the morning, at noon, in the evening, and at bedtime for 7 days. 05/20/24 05/27/24 Yes Iola Lukes, FNP    Scheduled Meds:  chlorhexidine   15 mL Mouth/Throat BID   dexamethasone  (DECADRON ) injection  10 mg Intravenous Q24H   enoxaparin  (LOVENOX ) injection  40 mg  Subcutaneous QHS   ketorolac   15 mg Intravenous Q6H   Continuous Infusions:  acetaminophen  Stopped (05/23/24 1243)   ampicillin -sulbactam (UNASYN ) IV     PRN Meds:   Allergies:   No Known Allergies  Social History:   Social History   Socioeconomic History   Marital status: Married    Spouse name: Not on file   Number of children: 1   Years of education: Not on file   Highest education level: Bachelor's degree (e.g., BA, AB, BS)  Occupational History   Not on file  Tobacco Use   Smoking status: Former    Current packs/day: 0.00    Types: Cigarettes    Quit date: 11/30/1993    Years since quitting: 30.4   Smokeless tobacco: Never  Vaping Use   Vaping status: Never Used  Substance and Sexual Activity   Alcohol use: Not Currently    Comment: 1x per week   Drug use: No   Sexual activity: Yes    Partners: Male    Birth control/protection: Post-menopausal  Other Topics Concern   Not on file  Social History Narrative   Not on file   Social Drivers of Health   Financial Resource Strain: Not on file  Food Insecurity: Not on file  Transportation Needs: No Transportation Needs (05/23/2024)   PRAPARE - Administrator, Civil Service (Medical): No    Lack of Transportation (Non-Medical): No  Physical Activity: Not on file  Stress: Not on file  Social Connections: Unknown (03/29/2022)   [facility]   Social Network    Social Network: Not on file  Intimate Partner Violence: Unknown (02/17/2022)   Received from [facility]   HITS    Physically Hurt: Not on file    Insult or Talk Down To: Not on file    Threaten Physical Harm: Not on file    Scream or Curse: Not on file    Family History:    Family History  Problem Relation Age of Onset   Breast cancer Mother 7       blood cancer too   Brain cancer Paternal Aunt        diagnosed in late 50s to early 19s   Breast cancer Cousin        paternal cousin diagnosed; diagnosed in her late 48s    Colon cancer Neg Hx    Colon polyps Neg Hx    Esophageal cancer Neg Hx    Rectal cancer Neg Hx    Stomach cancer Neg Hx      ROS:  Please see the history of present illness.   All other ROS reviewed and negative.     Physical Exam/Data: Vitals:   05/23/24 0801 05/23/24 0812 05/23/24 1100 05/23/24 1232  BP:  104/71 123/82 109/80  Pulse:  85 94 95  Resp:  20 20 20   Temp: 98.3 F (36.8 C)   97.9 F (36.6 C)  TempSrc: Oral   Oral  SpO2:  99% 99% 95%  Weight:      Height:       No intake or output data in the 24 hours ending 05/23/24 1508    05/23/2024    2:00 AM 10/10/2023    2:00 PM 05/26/2022   10:08 AM  Last 3 Weights  Weight (lbs) 170 lb 174 lb 171 lb  Weight (kg) 77.111 kg 78.926 kg 77.565 kg     Body mass index is 25.1 kg/m.  General:  Well nourished, well developed, in no acute distress HEENT: normal Neck: no JVD Vascular: No carotid bruits; Distal pulses 2+ bilaterally Cardiac:  normal S1, S2; RRR; no murmur  Lungs:  clear to auscultation bilaterally, no wheezing, rhonchi or rales  Abd: soft, nontender, no hepatomegaly  Ext: no edema Musculoskeletal:  No deformities, BUE and BLE strength normal and equal Skin: warm and dry  Neuro:  CNs 2-12 intact, no focal abnormalities noted Psych:  Normal affect   EKG:  The EKG was personally reviewed and demonstrates: Normal sinus rhythm with diffuse ST elevation in the inferolateral leads. Telemetry:  Telemetry was personally reviewed and demonstrates: Normal sinus rhythm, heart rate in the 80s.  Relevant CV Studies:  Pending echocardiogram, preliminary review suggested EF around 55%.  Laboratory Data: High Sensitivity Troponin:   Recent Labs  Lab 05/23/24 0436 05/23/24 0620  TROPONINIHS 4 4     Chemistry Recent Labs  Lab 05/22/24 1133 05/23/24 0436  NA 135 131*  K 3.2* 3.3*  CL 103 101  CO2 18* 21*  GLUCOSE 138* 129*  BUN 22* 31*  CREATININE 0.82 0.98  CALCIUM 9.3 8.8*  MG  --  2.0  GFRNONAA  >60 >60  ANIONGAP 14 9    Recent Labs  Lab 05/22/24  1133  PROT 7.3  ALBUMIN 3.1*  AST 21  ALT 12  ALKPHOS 76  BILITOT 1.0   Lipids No results for input(s): CHOL, TRIG, HDL, LABVLDL, LDLCALC, CHOLHDL in the last 168 hours.  Hematology Recent Labs  Lab 05/22/24 1133 05/23/24 0436  WBC 5.9 2.8*  RBC 4.06 3.64*  HGB 13.4 11.9*  HCT 40.2 35.6*  MCV 99.0 97.8  MCH 33.0 32.7  MCHC 33.3 33.4  RDW 13.4 13.5  PLT 155 153   Thyroid No results for input(s): TSH, FREET4 in the last 168 hours.  BNPNo results for input(s): BNP, PROBNP in the last 168 hours.  DDimer No results for input(s): DDIMER in the last 168 hours.  Radiology/Studies:  DG Orthopantogram Result Date: 05/23/2024 CLINICAL DATA:  Poor dentition, right earache, fever. EXAM: ORTHOPANTOGRAM/PANORAMIC COMPARISON:  Neck CT 05/22/2024. FINDINGS: There are 3 absent left mandibular molars and premolars. No significant periodontal disease identified associated with the remaining teeth. There is no evidence of acute fracture or dislocation. The temporomandibular joints appear unremarkable. IMPRESSION: No acute osseous findings or significant periodontal disease identified. Absent left mandibular molars and premolars. Electronically Signed   By: Elsie Perone M.D.   On: 05/23/2024 12:18   CT Chest W Contrast Result Date: 05/22/2024 CLINICAL DATA:  Neck abscess EXAM: CT CHEST WITH CONTRAST TECHNIQUE: Multidetector CT imaging of the chest was performed during intravenous contrast administration. RADIATION DOSE REDUCTION: This exam was performed according to the departmental dose-optimization program which includes automated exposure control, adjustment of the mA and/or kV according to patient size and/or use of iterative reconstruction technique. CONTRAST:  50mL OMNIPAQUE  IOHEXOL  350 MG/ML SOLN COMPARISON:  CT 05/22/2024, chest x-ray 05/22/2024 FINDINGS: Cardiovascular: Nonaneurysmal aorta. Mild aortic  atherosclerosis. Coronary vascular calcification. Upper normal cardiac size. Trace pericardial effusion. Mediastinum/Nodes: Patent trachea. No thyroid mass. Generalized hazy density and stranding throughout the mediastinum, within the prevascular space, thoracic inlet, and within the middle and posterior mediastinum. Haziness surrounding the aorta, likely due to generalized inflammatory process within the mediastinum. Suspicion of small complex fluid collection within the left posterior mediastinum, posterior to the left cardiac silhouette and to the left of the aorta, this measures about 2.9 x 1.7 cm by 4.4 cm on series 3, image 110 and coronal series 6, image 61 and is concerning for small abscess. Mild diffuse circumferential esophageal thickening. No mediastinal air or focal gas collections. Possible small bowel [place] organizing fluid along the distal right esophagus, series 3 image 112, extending to the diaphragmatic hiatus, this measures about 19 x 15 mm on series 3, image 124, and on coronal views extends over a craniocaudal dimension of 6.5 cm, series 6, image 64. No suspicious axillary lymph nodes. Mild mediastinal lymph nodes measuring up to 10 mm in the right precarinal space. Lungs/Pleura: Emphysema. No acute airspace disease, pleural effusion or pneumothorax. Probable small focus of scarring at the lingula. Linear scarring or atelectasis at the left base. Mild scarring or atelectasis at the right base as well. Upper Abdomen: No acute finding. Subcentimeter hypodensity in the right kidney too small to further characterize, no specific imaging follow-up is recommended. Musculoskeletal: Scoliosis.  No acute osseous abnormality. IMPRESSION: 1. Constellation of findings suspicious for diffuse mediastinitis/mediastinal infection as described above. Small complex slightly organizing left posterior mediastinal fluid collection suspicious for abscess, though no internal gas at this time. Additional fluid along  the right aspect of the distal esophagus without definitive rim enhancement, but difficult to exclude small developing abscess here as well. Mild  mediastinal adenopathy. 2. Mild diffuse circumferential esophageal thickening, presumably due to inflammation from diffuse inflammatory mediastinal process. No focal air collections or gas within the mediastinum to suggest perforation. 3. Emphysema. 4. Aortic atherosclerosis. Aortic Atherosclerosis (ICD10-I70.0) and Emphysema (ICD10-J43.9). Electronically Signed   By: Luke Bun M.D.   On: 05/22/2024 17:04   CT Soft Tissue Neck W Contrast Addendum Date: 05/22/2024 ADDENDUM REPORT: 05/22/2024 15:50 ADDENDUM: These results were called by telephone at the time of interpretation on 05/22/2024 at 3:45 pm to provider AMY LOWTHER , who verbally acknowledged these results. Electronically Signed   By: Donnice Mania M.D.   On: 05/22/2024 15:50   Result Date: 05/22/2024 CLINICAL DATA:  Tonsil, adenoid disorder.  Ear and throat pain. EXAM: CT NECK WITH CONTRAST TECHNIQUE: Multidetector CT imaging of the neck was performed using the standard protocol following the bolus administration of intravenous contrast. RADIATION DOSE REDUCTION: This exam was performed according to the departmental dose-optimization program which includes automated exposure control, adjustment of the mA and/or kV according to patient size and/or use of iterative reconstruction technique. CONTRAST:  75mL OMNIPAQUE  IOHEXOL  350 MG/ML SOLN COMPARISON:  None Available. FINDINGS: Pharynx and larynx: There is a 2.3 x 1.3 x 2.3 cm peripherally enhancing fluid collection involving the right aspect of the supraglottic airway involving the aryepiglottic folds and paraglottic fat concern for abscess. There is associated mass effect on the airway with moderate narrowing and significant effacement of the right piriform sinus. Additional component of the collection extends along the medial and inferior aspect of the hyoid  within the visceral space. The collection extends inferiorly to the level of the thyroid and extends anteriorly to midline. There is a distal component of the collection which extends into the retropharyngeal space with peripheral enhancement suggestive of retropharyngeal abscess. This portion of the abscess measures up to 4 mm in thickness. There is additional nonenhancing edema extending into the left posterolateral aspect of the visceral space extending inferiorly along the posterolateral aspect of the left thyroid lobe with additional edema in the inferior aspect of the left carotid space. The nasopharynx is symmetric. Palatine tonsils are relatively symmetric in size with multiple calcifications suggestive of tonsilliths versus prior inflammation. There are additional peripherally enhancing collections noted along the inferior medial aspect of the right palatine tonsil measuring 0.6 x 0.5 x 0.9 cm and 0.5 x 0.6 x 0.6 cm respectively. The palate, oral cavity, and floor of mouth are unremarkable. There is thickening of the epiglottis likely reflecting reactive edema. Salivary glands: No inflammation, mass, or stone. Thyroid: Normal. Lymph nodes: Enlarged right level 2A cervical nodes measuring up to 1.2 cm in short axis. There are multiple left-sided cervical lymph nodes which measure less than 1 cm in short axis. Vascular: Mild atherosclerosis at the carotid bifurcations without evidence of high-grade stenosis. Limited intracranial: Limited visualization of intracranial structures without focal acute abnormality. Visualized orbits: Negative. Mastoids and visualized paranasal sinuses: Mild mucosal thickening in the alveolar recesses of the maxillary sinuses. Mastoid air cells are clear. Skeleton: No acute or aggressive process. Upper chest: Paraseptal emphysema in the lung apices. Additional areas of centrilobular emphysema. There is ill-defined edema within the partially visualized superior mediastinum. Other:  None. IMPRESSION: Large complex abscess involving the right aspect of the supraglottic airway with involvement of the right aryepiglottic fold and paraglottic fat. Collection extends along the inferior medial aspect of the hyoid into the visceral space along the right aspect of the airway. Collection extends inferiorly to the level of the  thyroid. Additional component of retropharyngeal abscess. Additional small peritonsillar abscesses in the inferior medial aspect of the right palatine tonsil. Moderate airway narrowing of the supraglottic airway. Potential for extension of the collection to the mediastinum. There is ill-defined edema within the partially visualized superior mediastinum. Recommend correlation with dedicated chest CT. Thickening of the epiglottis likely reflecting reactive edema. Enlarged right 2A cervical node, likely reactive. Emphysema (ICD10-J43.9). Electronically Signed: By: Donnice Mania M.D. On: 05/22/2024 15:33   DG Chest 2 View Result Date: 05/22/2024 CLINICAL DATA:  Chest pain, shortness of breath. EXAM: CHEST - 2 VIEW COMPARISON:  August 23, 2012. FINDINGS: The heart size and mediastinal contours are within normal limits. Hypoinflation of the lungs is noted with minimal bibasilar subsegmental atelectasis. The visualized skeletal structures are unremarkable. IMPRESSION: Hypoinflation of the lungs with minimal bibasilar subsegmental atelectasis. Electronically Signed   By: Lynwood Landy Raddle M.D.   On: 05/22/2024 12:35     Assessment and Plan: Pericarditis: Related to multifocal abscess and inflammation from the tongue all the way into the chest cavity.  EKG showed sinus rhythm with diffuse ST elevation in the inferolateral leads.  Patient is currently receiving IV Decadron  and IV antibiotic.  At the time of discharge, may consider addition of colchicine, however for now we will rely on Decadron  and antibiotic to clear of infection and inflammation.  Will defer to MD to consider limited  echocardiogram in a few weeks after discharge +/- repeat CT  Multifocal abscess: Dental procedure in April.  Multiple pockets of abscess noted.  Dry mouth: Under evaluation for Sjogren's disease by primary team.   Risk Assessment/Risk Scores:              For questions or updates, please contact [facility] Please consult [email or web address] for contact info under    Bonney Scot Ford, [place]  05/23/2024 3:08 PM  Patient seen and examined with Scot Ford PA.  Agree as above, with the following exceptions and changes as noted below. Pt noted to have probable pericarditis based on EKG changes and pleuritic chest pain in the setting of neck abscess after dental procedure. Gen: NAD, CV: RRR, no murmurs, Lungs: clear, Abd: soft, Extrem: Warm, well perfused, no edema, Neuro/Psych: alert and oriented x 3, normal mood and affect. All available labs, radiology testing, previous records reviewed. Overall, clinically stable with only small effusion on echocardiogram, no tamponade or hemodynamically significant constrictive physiology. Already on Decadron  for neck abscess, continue and observe chest pain. Repeat EKG and contact on call cardiology for any worsening of CP or SOB. I have alerted the patient to this.   [place] Merida A Madia Carvell, MD 05/23/24 7:28 PM

## 2024-05-24 DIAGNOSIS — I319 Disease of pericardium, unspecified: Secondary | ICD-10-CM | POA: Diagnosis not present

## 2024-05-24 DIAGNOSIS — J36 Peritonsillar abscess: Secondary | ICD-10-CM | POA: Diagnosis not present

## 2024-05-24 DIAGNOSIS — J853 Abscess of mediastinum: Secondary | ICD-10-CM | POA: Diagnosis not present

## 2024-05-24 DIAGNOSIS — Z8679 Personal history of other diseases of the circulatory system: Secondary | ICD-10-CM | POA: Insufficient documentation

## 2024-05-24 DIAGNOSIS — J387 Other diseases of larynx: Secondary | ICD-10-CM | POA: Diagnosis not present

## 2024-05-24 HISTORY — DX: Personal history of other diseases of the circulatory system: Z86.79

## 2024-05-24 LAB — BASIC METABOLIC PANEL WITH GFR
Anion gap: 12 (ref 5–15)
BUN: 30 mg/dL — ABNORMAL HIGH (ref 6–20)
CO2: 20 mmol/L — ABNORMAL LOW (ref 22–32)
Calcium: 8.9 mg/dL (ref 8.9–10.3)
Chloride: 103 mmol/L (ref 98–111)
Creatinine, Ser: 0.79 mg/dL (ref 0.44–1.00)
GFR, Estimated: >60 mL/min (ref >60)
Glucose, Bld: 114 mg/dL — ABNORMAL HIGH (ref 70–99)
Potassium: 3.2 mmol/L — ABNORMAL LOW (ref 3.5–5.1)
Sodium: 135 mmol/L (ref 135–145)

## 2024-05-24 LAB — CBC
HCT: 34.9 % — ABNORMAL LOW (ref 36.0–46.0)
Hemoglobin: 12.2 g/dL (ref 12.0–15.0)
MCH: 33.2 pg (ref 26.0–34.0)
MCHC: 35 g/dL (ref 30.0–36.0)
MCV: 94.8 fL (ref 80.0–100.0)
Platelets: 170 K/uL (ref 150–400)
RBC: 3.68 MIL/uL — ABNORMAL LOW (ref 3.87–5.11)
RDW: 13.3 % (ref 11.5–15.5)
WBC: 5.5 K/uL (ref 4.0–10.5)
nRBC: 0.4 % — ABNORMAL HIGH (ref 0.0–0.2)

## 2024-05-24 MED ORDER — OXYCODONE HCL 5 MG PO TABS
2.5000 mg | ORAL_TABLET | ORAL | Status: DC | PRN
  Administered 2024-05-24 (×2): 2.5 mg via ORAL
  Filled 2024-05-24 (×2): qty 1

## 2024-05-24 MED ORDER — ACETAMINOPHEN 160 MG/5ML PO SOLN: 650.0000 mg | Freq: Four times a day (QID) | ORAL | Status: DC | PRN

## 2024-05-24 MED ORDER — POTASSIUM CHLORIDE 20 MEQ PO PACK
80.0000 meq | PACK | Freq: Once | ORAL | Status: AC
  Administered 2024-05-24: 80 meq via ORAL
  Filled 2024-05-24: qty 4

## 2024-05-24 NOTE — Progress Notes (Signed)
 Patient ID: Sue [name], female   DOB: 12/24/1965, 58 y.o.   MRN: [id number]  She looks great. There doesn't appear to be any swelling or pain in the neck. Her voice is good. No breathing issues. She is taking fluids well.  She is progressing well on medical therapy. I do not see an need to intervene in the neck as there is now almost no signs or symptoms. I would follow ID recommendations for abx therapy. Follow up with us  [phone] in few weeks unless worsening

## 2024-05-24 NOTE — Assessment & Plan Note (Addendum)
 Echo 6/10 with a small pericardial effusion, no tamponade. EKG with nonspecific ST abnormality, repeat largely unchanged; suspect contribution from pericarditis. Pt receiving IV Decadron . - Continue IV Decadron  - Cards consulted, appreciate recommendations - Consider addition of colchicine  at discharge

## 2024-05-24 NOTE — Assessment & Plan Note (Deleted)
 CT chest showing same; pt with remote smoking history. No dyspnea, normal lung sounds today. No further evaluation needed while inpatient. - Pursue outpatient workup

## 2024-05-24 NOTE — Assessment & Plan Note (Addendum)
 Symptoms of dyspnea and pain improving, will continue abx and steroids. Pt reporting poor sleep and restlessness, suspect related to steroids, and so will retime. - Abx: IV Unasyn  3g q6h - Will switch to PO Augmentin , ID to help determine timing - Steroids: IV Dexamethasone  10 mg daily - Will move steroid to earlier in day - Pain: DC IV ketorolac ; start PO acetaminophen  q5h 650 mg (syrup form); start PO oxy 2.5 q4h (crushed) - Chlorhexidine  oral rinses twice daily - ENT following, appreciate recommendations - Will defer to ENT about length of steroid course - ID following, appreciate recommendations - Will defer to ID about switch to PO abx, time course for repeat imaging (3 days?)

## 2024-05-24 NOTE — Progress Notes (Signed)
 Daily Progress Note Intern Pager: [phone]  Patient name: ALEAYAH CHICO Medical record number: [id number] Date of birth: 1965/11/27 Age: 58 y.o. Gender: female  Primary Care Provider: Rudy Carlin LABOR, MD Consultants: ENT, ID, cardiology Code Status: FULL  Pt Overview and Major Events to Date:  - 7/9: Pt admitted, ENT consulted - 7/10: ID, cards consulted  Medical Decision Making:  ARLIS EVERLY is a 58 y.o. female admitted with ear, neck, chest pain 2/2 retropharyngeal and supraglottic abscesses and mediastinitis with pericarditis. No surgical intervention indicated.  Uncertain etiology of abscesses.  Low suspicion for dental source given pt with hx of poor dentition, but orthopantogram done 7/10 showed no acute osseous findings or significant periodontal disease.  Considering otogenic source given pt with recurrent ear symptoms and possible recent infection.  Considering autoimmune etiology such as Sjogren's, also considering contribution from prior radiation for breast cancer.   Pertinent PMH/PSH includes hx breast cancer, remote hx tobacco use.  Assessment & Plan Abscess of supraglottic region Retropharyngeal abscess Acute mediastinitis Symptoms of dyspnea and pain improving, will continue abx and steroids. Pt reporting poor sleep and restlessness, suspect related to steroids, and so will retime. - Abx: IV Unasyn  3g q6h - Will switch to PO Augmentin , ID to help determine timing - Steroids: IV Dexamethasone  10 mg daily - Will move steroid to earlier in day - Pain: DC IV ketorolac ; start PO acetaminophen  q5h 650 mg (syrup form); start PO oxy 2.5 q4h (crushed) - Chlorhexidine  oral rinses twice daily - ENT following, appreciate recommendations - Will defer to ENT about length of steroid course - ID following, appreciate recommendations - Will defer to ID about switch to PO abx, time course for repeat imaging (3 days?) Pericarditis Abnormal EKG Echo 6/10 with a small  pericardial effusion, no tamponade. EKG with nonspecific ST abnormality, repeat largely unchanged; suspect contribution from pericarditis. Pt receiving IV Decadron . - Continue IV Decadron  - Cards consulted, appreciate recommendations - Consider addition of colchicine  at discharge Proteinuria Urinalysis with protein 100, Cr 0.79. Ha1c 5.4%. No concern for kidney disease, prediabetes at this time. Most likely related to inflammatory picture. - Repeat urinalysis outpatient; no further inpatient workup indicated   FEN/GI: Thin liquid diet PPx: Lovenox  Dispo:Home pending clinical improvement . Barriers include continued need for IV abx, steroids.   Subjective:  No overnight events.  Today, pt reports she is feeling better from a breathing and pain perspective. She continues to do well swallowing liquids, but is hesitant about solids. She reports she slept poorly and felt jittery/restless/shaky after receiving one of her IV medications; unsure which one.  Objective: Temp:  [97.9 F (36.6 C)-98.5 F (36.9 C)] 98.2 F (36.8 C) (07/11 0729) Pulse Rate:  [79-95] 86 (07/11 0729) Resp:  [20-28] 28 (07/11 0729) BP: (90-123)/(67-91) 110/91 (07/11 0729) SpO2:  [94 %-99 %] 94 % (07/11 0729) Physical Exam: General: Pt lying back in bed with head of bed elevated, no acute distress. Cardiovascular: Regular rate and rhythm, no murmurs/rubs/gallops. Respiratory: Normal work of breathing on room air. Clear to auscultation bilaterally; no wheezes, crackles. Abdomen: Bowel sounds present and normoactive bilaterally. Soft, nondistended, nontender. Extremities: Skin warm, dry. No bilateral lower extremity bilaterally. Neuro: Alert and oriented to person, place, time.  Laboratory: Most recent CBC Lab Results  Component Value Date   WBC 5.5 05/24/2024   HGB 12.2 05/24/2024   HCT 34.9 (L) 05/24/2024   MCV 94.8 05/24/2024   PLT 170 05/24/2024   Most recent BMP  Latest Ref Rng & Units 05/24/2024     3:43 AM  BMP  Glucose 70 - 99 mg/dL 885   BUN 6 - 20 mg/dL 30   Creatinine 9.55 - 1.00 mg/dL 9.20   Sodium 864 - 854 mmol/L 135   Potassium 3.5 - 5.1 mmol/L 3.2   Chloride 98 - 111 mmol/L 103   CO2 22 - 32 mmol/L 20   Calcium 8.9 - 10.3 mg/dL 8.9    Echo 2/89: LVEF 55-60%, Small pericardial effusion, borderline dilated ascending aorta (39 mm), otherwise unremarkable.   Larraine Palma, MD 05/24/2024, 8:04 AM  PGY-1, [facility] FPTS Intern pager: [phone], text pages welcome Secure chat group [facility] Teaching Service

## 2024-05-24 NOTE — Progress Notes (Signed)
  Progress Note Patient Name: Sue [name] Date of Encounter: 05/24/2024 [facility] Cardiologist: Soyla DELENA Merck, MD   Interval Summary   Feeling better but difficulty with sob walking to bathroom.   Vital Signs Vitals:   05/24/24 0729 05/24/24 1000 05/24/24 1148 05/24/24 1618  BP: (!) 110/91  117/72 110/75  Pulse: 86  85 75  Resp: (!) 28 20 (!) 23 (!) 37  Temp: 98.2 F (36.8 C)  97.6 F (36.4 C) 97.8 F (36.6 C)  TempSrc: Oral  Oral Oral  SpO2: 94%   95%  Weight:      Height:        Intake/Output Summary (Last 24 hours) at 05/24/2024 1817 Last data filed at 05/24/2024 1722 Gross per 24 hour  Intake 1113.77 ml  Output --  Net 1113.77 ml      05/23/2024    2:00 AM 10/10/2023    2:00 PM 05/26/2022   10:08 AM  Last 3 Weights  Weight (lbs) 170 lb 174 lb 171 lb  Weight (kg) 77.111 kg 78.926 kg 77.565 kg      Telemetry/ECG  SR, no heart block or ST changes noted - Personally Reviewed  Physical Exam  GEN: No acute distress.   Neck: No JVD Cardiac: RRR, no murmurs, rubs, or gallops.  Respiratory: Clear to auscultation bilaterally. GI: Soft, nontender, non-distended  MS: No edema  Assessment & Plan  Pericarditis:  - on dexamethasone  for abscess, will cover pericarditis as well. If stopped, we may want to consider adding colchicine  0.6 mg bid. - will repeat limited echo prior to hospital discharge to ensure no pericardial effusion.   Multifocal abscess: Dental procedure in April.  Multiple pockets of abscess noted.   Dry mouth: Under evaluation for Sjogren's disease by primary team.   For questions or updates, please contact [facility] Please consult [email or web address] for contact info under       Signed, Jazmene Racz A Addalee Kavanagh, MD

## 2024-05-24 NOTE — Progress Notes (Signed)
 Subjective: She is feeling better and able to eat and drink things she could not a couple of days ago   Antibiotics:  Anti-infectives (From admission, onward)    Start     Dose/Rate Route Frequency Ordered Stop   05/23/24 2200  vancomycin  (VANCOCIN ) IVPB 1000 mg/200 mL premix  Status:  Discontinued        1,000 mg 200 mL/hr over 60 Minutes Intravenous Every 12 hours 05/23/24 0702 05/23/24 1250   05/23/24 1400  Ampicillin -Sulbactam (UNASYN ) 3 g in sodium chloride  0.9 % 100 mL IVPB        3 g 200 mL/hr over 30 Minutes Intravenous Every 6 hours 05/23/24 1313     05/23/24 0800  vancomycin  (VANCOREADY) IVPB 1500 mg/300 mL        1,500 mg 150 mL/hr over 120 Minutes Intravenous  Once 05/23/24 0702 05/23/24 1022   05/23/24 0600  piperacillin -tazobactam (ZOSYN ) IVPB 3.375 g  Status:  Discontinued        3.375 g 12.5 mL/hr over 240 Minutes Intravenous Every 8 hours 05/22/24 2203 05/23/24 1250   05/22/24 2200  piperacillin -tazobactam (ZOSYN ) IVPB 3.375 g  Status:  Discontinued        3.375 g 12.5 mL/hr over 240 Minutes Intravenous Every 8 hours 05/22/24 2127 05/22/24 2203   05/22/24 1600  Ampicillin -Sulbactam (UNASYN ) 3 g in sodium chloride  0.9 % 100 mL IVPB        3 g 200 mL/hr over 30 Minutes Intravenous  Once 05/22/24 1547 05/22/24 1806       Medications: Scheduled Meds:  chlorhexidine   15 mL Mouth/Throat BID   enoxaparin  (LOVENOX ) injection  40 mg Subcutaneous QHS   Continuous Infusions:  ampicillin -sulbactam (UNASYN ) IV 3 g (05/24/24 1353)   PRN Meds:.acetaminophen  (TYLENOL ) oral liquid 160 mg/5 mL, oxyCODONE     Objective: Weight change:   Intake/Output Summary (Last 24 hours) at 05/24/2024 1506 Last data filed at 05/24/2024 1007 Gross per 24 hour  Intake 1109.51 ml  Output --  Net 1109.51 ml   Blood pressure 117/72, pulse 85, temperature 97.6 F (36.4 C), temperature source Oral, resp. rate (!) 23, height 5' 9 (1.753 m), weight 77.1 kg, last menstrual  period 10/24/2018, SpO2 94%. Temp:  [97.6 F (36.4 C)-98.5 F (36.9 C)] 97.6 F (36.4 C) (07/11 1148) Pulse Rate:  [79-86] 85 (07/11 1148) Resp:  [20-28] 23 (07/11 1148) BP: (90-123)/(67-91) 117/72 (07/11 1148) SpO2:  [94 %-98 %] 94 % (07/11 0729)  Physical Exam: Physical Exam Constitutional:      General: She is not in acute distress.    Appearance: She is well-developed. She is not diaphoretic.  HENT:     Head: Normocephalic and atraumatic.     Right Ear: External ear normal.     Left Ear: External ear normal.     Mouth/Throat:     Pharynx: No oropharyngeal exudate.  Eyes:     General: No scleral icterus.    Conjunctiva/sclera: Conjunctivae normal.     Pupils: Pupils are equal, round, and reactive to light.  Neck:     Comments: Edema improved Cardiovascular:     Rate and Rhythm: Normal rate and regular rhythm.  Pulmonary:     Effort: Pulmonary effort is normal. No respiratory distress.     Breath sounds: No wheezing.  Abdominal:     General: Bowel sounds are normal. There is no distension.     Palpations: Abdomen is soft.     Tenderness: There  is no abdominal tenderness. There is no rebound.  Musculoskeletal:        General: No tenderness. Normal range of motion.  Lymphadenopathy:     Cervical: Cervical adenopathy present.  Skin:    General: Skin is warm and dry.     Coloration: Skin is not pale.     Findings: No erythema or rash.  Neurological:     General: No focal deficit present.     Mental Status: She is alert and oriented to person, place, and time.     Motor: No abnormal muscle tone.     Coordination: Coordination normal.  Psychiatric:        Mood and Affect: Mood normal.        Behavior: Behavior normal.        Thought Content: Thought content normal.        Judgment: Judgment normal.      CBC:    BMET Recent Labs    05/23/24 0436 05/24/24 0343  NA 131* 135  K 3.3* 3.2*  CL 101 103  CO2 21* 20*  GLUCOSE 129* 114*  BUN 31* 30*   CREATININE 0.98 0.79  CALCIUM 8.8* 8.9     Liver Panel  Recent Labs    05/22/24 1133  PROT 7.3  ALBUMIN 3.1*  AST 21  ALT 12  ALKPHOS 76  BILITOT 1.0       Sedimentation Rate No results for input(s): ESRSEDRATE in the last 72 hours. C-Reactive Protein No results for input(s): CRP in the last 72 hours.  Micro Results: Recent Results (from the past 720 hours)  MRSA Next Gen by PCR, Nasal     Status: None   Collection Time: 05/22/24 10:06 PM   Specimen: Nasal Mucosa; Nasal Swab  Result Value Ref Range Status   MRSA by PCR Next Gen NOT DETECTED NOT DETECTED Final    Comment: (NOTE) The GeneXpert MRSA Assay (FDA approved for NASAL specimens only), is one component of a comprehensive MRSA colonization surveillance program. It is not intended to diagnose MRSA infection nor to guide or monitor treatment for MRSA infections. Test performance is not FDA approved in patients less than 4 years old. Performed at [facility], [address], [place], [place] [address]   Culture, blood (Routine X 2) w Reflex to ID Panel     Status: None (Preliminary result)   Collection Time: 05/23/24 12:11 AM   Specimen: BLOOD  Result Value Ref Range Status   Specimen Description BLOOD RIGHT ANTECUBITAL  Final   Special Requests   Final    BOTTLES DRAWN AEROBIC AND ANAEROBIC Blood Culture adequate volume   Culture   Final    NO GROWTH 1 DAY Performed at [facility], [address] [place], [place] [address]    Report Status PENDING  Incomplete  Culture, blood (Routine X 2) w Reflex to ID Panel     Status: None (Preliminary result)   Collection Time: 05/23/24 12:12 AM   Specimen: BLOOD LEFT HAND  Result Value Ref Range Status   Specimen Description BLOOD LEFT HAND  Final   Special Requests   Final    BOTTLES DRAWN AEROBIC AND ANAEROBIC Blood Culture adequate volume   Culture   Final    NO GROWTH 1 DAY Performed at [facility], [address],  [place], [place] [address]    Report Status PENDING  Incomplete    Studies/Results: ECHOCARDIOGRAM COMPLETE Result Date: 05/23/2024  ECHOCARDIOGRAM REPORT   Patient Name:   TIFFNEY HAUGHTON Date of Exam: 05/23/2024 Medical Rec #:  [id number]       Height:       69.0 in Accession #:    [id number]      Weight:       170.0 lb Date of Birth:  1966-01-23        BSA:          1.928 m Patient Age:    58 years        BP:           109/80 mmHg Patient Gender: F               HR:           85 bpm. Exam Location:  Inpatient Procedure: 2D Echo, 3D Echo, Color Doppler, Cardiac Doppler and Strain Analysis            (Both Spectral and Color Flow Doppler were utilized during            procedure). Indications:     Pericardial Effusion I31.3  History:         Patient has no prior history of Echocardiogram examinations.                  Signs/Symptoms:Abnormal EKG; Risk Factors:Radiation Therapy and                  Former Smoker.  Sonographer:     Koleen Popper RDCS Referring Phys:  [id number] TODD D MCDIARMID Diagnosing Phys: Toribio Fuel MD  Sonographer Comments: Global longitudinal strain was attempted. IMPRESSIONS  1. Left ventricular ejection fraction, by estimation, is 55 to 60%. Left ventricular ejection fraction by 3D volume is 56 %. The left ventricle has normal function. The left ventricle has no regional wall motion abnormalities. Left ventricular diastolic  parameters were normal. The average left ventricular global longitudinal strain is -17.4 %. The global longitudinal strain is normal.  2. Right ventricular systolic function is normal. The right ventricular size is normal. There is normal pulmonary artery systolic pressure. The estimated right ventricular systolic pressure is 26.8 mmHg.  3. A small pericardial effusion is present. The pericardial effusion is posterior to the left ventricle and lateral to the left ventricle. There is no evidence of cardiac tamponade.  4. The mitral valve is normal in structure. Mild  mitral valve regurgitation. No evidence of mitral stenosis.  5. The aortic valve is tricuspid. There is mild calcification of the aortic valve. Aortic valve regurgitation is not visualized. Aortic valve sclerosis/calcification is present, without any evidence of aortic stenosis.  6. Aortic dilatation noted. There is borderline dilatation of the ascending aorta, measuring 39 mm.  7. The inferior vena cava is normal in size with greater than 50% respiratory variability, suggesting right atrial pressure of 3 mmHg. Conclusion(s)/Recommendation(s): Very small pericardial effusion posterior and lateral to the LV. No tamponade. FINDINGS  Left Ventricle: Left ventricular ejection fraction, by estimation, is 55 to 60%. Left ventricular ejection fraction by 3D volume is 56 %. The left ventricle has normal function. The left ventricle has no regional wall motion abnormalities. The average left ventricular global longitudinal strain is -17.4 %. Strain was performed and the global longitudinal strain is normal. The left ventricular internal cavity size was normal in size. There is no left ventricular hypertrophy. Left ventricular diastolic parameters were normal. Right Ventricle: The right ventricular size is normal. No increase in right ventricular wall  thickness. Right ventricular systolic function is normal. There is normal pulmonary artery systolic pressure. The tricuspid regurgitant velocity is 2.44 m/s, and  with an assumed right atrial pressure of 3 mmHg, the estimated right ventricular systolic pressure is 26.8 mmHg. Left Atrium: Left atrial size was normal in size. Right Atrium: Right atrial size was normal in size. Pericardium: A small pericardial effusion is present. The pericardial effusion is posterior to the left ventricle and lateral to the left ventricle. There is no evidence of cardiac tamponade. Mitral Valve: The mitral valve is normal in structure. Mild mitral valve regurgitation. No evidence of mitral valve  stenosis. Tricuspid Valve: The tricuspid valve is normal in structure. Tricuspid valve regurgitation is mild . No evidence of tricuspid stenosis. Aortic Valve: The aortic valve is tricuspid. There is mild calcification of the aortic valve. Aortic valve regurgitation is not visualized. Aortic valve sclerosis/calcification is present, without any evidence of aortic stenosis. Pulmonic Valve: The pulmonic valve was normal in structure. Pulmonic valve regurgitation is trivial. No evidence of pulmonic stenosis. Aorta: Aortic dilatation noted. There is borderline dilatation of the ascending aorta, measuring 39 mm. Venous: The inferior vena cava is normal in size with greater than 50% respiratory variability, suggesting right atrial pressure of 3 mmHg. IAS/Shunts: No atrial level shunt detected by color flow Doppler. Additional Comments: 3D was performed not requiring image post processing on an independent workstation and was normal.  LEFT VENTRICLE PLAX 2D LVIDd:         5.10 cm         Diastology LVIDs:         3.70 cm         LV e' medial:    13.40 cm/s LV PW:         1.10 cm         LV E/e' medial:  6.9 LV IVS:        0.90 cm         LV e' lateral:   17.60 cm/s LVOT diam:     2.00 cm         LV E/e' lateral: 5.2 LV SV:         74 LV SV Index:   38              2D Longitudinal LVOT Area:     3.14 cm        Strain                                2D Strain GLS   -17.4 %                                Avg:                                 3D Volume EF                                LV 3D EF:    Left  ventricul                                             ar                                             ejection                                             fraction                                             by 3D                                             volume is                                             56 %.                                 3D Volume EF:                                 3D EF:        56 %                                LV EDV:       164 ml                                LV ESV:       72 ml                                LV SV:        92 ml RIGHT VENTRICLE             IVC RV Basal diam:  4.30 cm     IVC diam: 1.50 cm RV Mid diam:    3.20 cm RV S prime:     12.60 cm/s TAPSE (M-mode): 2.6 cm LEFT ATRIUM           Index        RIGHT ATRIUM           Index LA diam:      2.90 cm 1.50 cm/m   RA Area:     16.80 cm LA Vol (A2C): 41.6 ml 21.58 ml/m  RA Volume:   47.50 ml  24.64 ml/m LA Vol (A4C):  35.3 ml 18.31 ml/m  AORTIC VALVE LVOT Vmax:   135.00 cm/s LVOT Vmean:  87.500 cm/s LVOT VTI:    0.236 m  AORTA Ao Root diam: 3.10 cm Ao Asc diam:  3.90 cm MITRAL VALVE               TRICUSPID VALVE MV Area (PHT): 3.91 cm    TR Peak grad:   23.8 mmHg MV Decel Time: 194 msec    TR Vmax:        244.00 cm/s MV E velocity: 92.20 cm/s MV A velocity: 74.40 cm/s  SHUNTS MV E/A ratio:  1.24        Systemic VTI:  0.24 m                            Systemic Diam: 2.00 cm Toribio Fuel MD Electronically signed by Toribio Fuel MD Signature Date/Time: 05/23/2024/8:37:29 PM    Final (Updated)    DG Orthopantogram Result Date: 05/23/2024 CLINICAL DATA:  Poor dentition, right earache, fever. EXAM: ORTHOPANTOGRAM/PANORAMIC COMPARISON:  Neck CT 05/22/2024. FINDINGS: There are 3 absent left mandibular molars and premolars. No significant periodontal disease identified associated with the remaining teeth. There is no evidence of acute fracture or dislocation. The temporomandibular joints appear unremarkable. IMPRESSION: No acute osseous findings or significant periodontal disease identified. Absent left mandibular molars and premolars. Electronically Signed   By: Elsie Perone M.D.   On: 05/23/2024 12:18   CT Chest W Contrast Result Date: 05/22/2024 CLINICAL DATA:  Neck abscess EXAM: CT CHEST WITH CONTRAST TECHNIQUE: Multidetector CT imaging of the chest was performed during intravenous contrast  administration. RADIATION DOSE REDUCTION: This exam was performed according to the departmental dose-optimization program which includes automated exposure control, adjustment of the mA and/or kV according to patient size and/or use of iterative reconstruction technique. CONTRAST:  50mL OMNIPAQUE  IOHEXOL  350 MG/ML SOLN COMPARISON:  CT 05/22/2024, chest x-ray 05/22/2024 FINDINGS: Cardiovascular: Nonaneurysmal aorta. Mild aortic atherosclerosis. Coronary vascular calcification. Upper normal cardiac size. Trace pericardial effusion. Mediastinum/Nodes: Patent trachea. No thyroid mass. Generalized hazy density and stranding throughout the mediastinum, within the prevascular space, thoracic inlet, and within the middle and posterior mediastinum. Haziness surrounding the aorta, likely due to generalized inflammatory process within the mediastinum. Suspicion of small complex fluid collection within the left posterior mediastinum, posterior to the left cardiac silhouette and to the left of the aorta, this measures about 2.9 x 1.7 cm by 4.4 cm on series 3, image 110 and coronal series 6, image 61 and is concerning for small abscess. Mild diffuse circumferential esophageal thickening. No mediastinal air or focal gas collections. Possible small bowel [place] organizing fluid along the distal right esophagus, series 3 image 112, extending to the diaphragmatic hiatus, this measures about 19 x 15 mm on series 3, image 124, and on coronal views extends over a craniocaudal dimension of 6.5 cm, series 6, image 64. No suspicious axillary lymph nodes. Mild mediastinal lymph nodes measuring up to 10 mm in the right precarinal space. Lungs/Pleura: Emphysema. No acute airspace disease, pleural effusion or pneumothorax. Probable small focus of scarring at the lingula. Linear scarring or atelectasis at the left base. Mild scarring or atelectasis at the right base as well. Upper Abdomen: No acute finding. Subcentimeter hypodensity in the right  kidney too small to further characterize, no specific imaging follow-up is recommended. Musculoskeletal: Scoliosis.  No acute osseous abnormality. IMPRESSION: 1. Constellation of findings suspicious for diffuse mediastinitis/mediastinal infection  as described above. Small complex slightly organizing left posterior mediastinal fluid collection suspicious for abscess, though no internal gas at this time. Additional fluid along the right aspect of the distal esophagus without definitive rim enhancement, but difficult to exclude small developing abscess here as well. Mild mediastinal adenopathy. 2. Mild diffuse circumferential esophageal thickening, presumably due to inflammation from diffuse inflammatory mediastinal process. No focal air collections or gas within the mediastinum to suggest perforation. 3. Emphysema. 4. Aortic atherosclerosis. Aortic Atherosclerosis (ICD10-I70.0) and Emphysema (ICD10-J43.9). Electronically Signed   By: Luke Bun M.D.   On: 05/22/2024 17:04      Assessment/Plan:  INTERVAL HISTORY: Patient doing well on Unasyn    Principal Problem:   Abscess of supraglottic region Active Problems:   Acute mediastinitis   Retropharyngeal abscess   Emphysema lung (HCC)   Abnormal EKG   Proteinuria   Pericarditis    TAKASHA VETERE is a 58 y.o. female with history of breast cancer who now is admitted with a large complex supraglottic abscess, peritonsillar abscesses with extension to hyoid with mediastinal abscess.  She also has pericarditis of the latter is likely reactive in nature rather than a purulent pericarditis.  She improved initially with vancomycin  and Zosyn  and is doing well also on Unasyn  Decadron  is also been on board through today.  If she continues to do well on Unasyn  would discharge on on Augmentin  875/125 mg twice daily and provide her with a month supply with a refill.  She needs repeat imaging of her neck and chest with a CT in a month from her last  scan.  I will arrange hospital follow-up with me in the clinic as below.   HAIDY KACKLEY has an appointment on 06/03/2024 at 315PM with Dr. Fleeta Rothman at  [facility], which  is located in the [facility] at  [address] [place] in [place].  [address], which is located to the left of the elevators.  Phone: [phone]  Fax: [phone]  [email or web address]  The patient should arrive 30 minutes prior to their appoitment.     I have personally spent 50 minutes involved in face-to-face and non-face-to-face activities for this patient on the day of the visit. Professional time spent includes the following activities: Preparing to see the patient (review of tests), Obtaining and/or reviewing separately obtained history (admission/discharge record), Performing a medically appropriate examination and/or evaluation , Ordering medications/tests/procedures, referring and communicating with other health care professionals, Documenting clinical information in the EMR, Independently interpreting results (not separately reported), Communicating results to the patient/family/caregiver, Counseling and educating the patient/family/caregiver and Care coordination (not separately reported).   Evaluation of the patient requires complex antimicrobial therapy evaluation, counseling , isolation needs to reduce disease transmission and risk assessment and mitigation.   I will sign off for now please call with further questions    LOS: 2 days   Jomarie Fleeta Rothman 05/24/2024, 3:06 PM

## 2024-05-24 NOTE — Plan of Care (Signed)
   Problem: Education: Goal: Knowledge of General Education information will improve Description: Including pain rating scale, medication(s)/side effects and non-pharmacologic comfort measures Outcome: Progressing   Problem: Activity: Goal: Risk for activity intolerance will decrease Outcome: Progressing   Problem: Nutrition: Goal: Adequate nutrition will be maintained Outcome: Progressing   Problem: Coping: Goal: Level of anxiety will decrease Outcome: Progressing

## 2024-05-24 NOTE — Assessment & Plan Note (Addendum)
 Urinalysis with protein 100, Cr 0.79. Ha1c 5.4%. No concern for kidney disease, prediabetes at this time. Most likely related to inflammatory picture. - Repeat urinalysis outpatient; no further inpatient workup indicated

## 2024-05-24 NOTE — Plan of Care (Signed)
  Problem: Health Behavior/Discharge Planning: Goal: Ability to manage health-related needs will improve Outcome: Progressing   Problem: Clinical Measurements: Goal: Ability to maintain clinical measurements within normal limits will improve Outcome: Progressing Goal: Diagnostic test results will improve Outcome: Progressing Goal: Respiratory complications will improve Outcome: Progressing Goal: Cardiovascular complication will be avoided Outcome: Progressing   Problem: Activity: Goal: Risk for activity intolerance will decrease Outcome: Progressing   Problem: Nutrition: Goal: Adequate nutrition will be maintained Outcome: Progressing   Problem: Elimination: Goal: Will not experience complications related to bowel motility Outcome: Progressing Goal: Will not experience complications related to urinary retention Outcome: Progressing   Problem: Pain Managment: Goal: General experience of comfort will improve and/or be controlled Outcome: Progressing   Problem: Safety: Goal: Ability to remain free from injury will improve Outcome: Progressing   Problem: Skin Integrity: Goal: Risk for impaired skin integrity will decrease Outcome: Progressing

## 2024-05-24 NOTE — Progress Notes (Signed)
   05/24/24 0729  Assess: MEWS Score  Temp 98.2 F (36.8 C)  BP (!) 110/91  MAP (mmHg) 98  Pulse Rate 86  ECG Heart Rate 83  Resp (!) 28  SpO2 94 %  O2 Device Room Air  Assess: MEWS Score  MEWS Temp 0  MEWS Systolic 0  MEWS Pulse 0  MEWS RR 2  MEWS LOC 0  MEWS Score 2  MEWS Score Color Yellow  Assess: if the MEWS score is Yellow or Red  Were vital signs accurate and taken at a resting state? No, vital signs rechecked  Does the patient meet 2 or more of the SIRS criteria? No  Notify: Charge Nurse/RN  Name of Charge Nurse/RN Notified Nena  Assess: SIRS CRITERIA  SIRS Temperature  0  SIRS Respirations  1  SIRS Pulse 0  SIRS WBC 0  SIRS Score Sum  1

## 2024-05-24 NOTE — Plan of Care (Signed)
 Spoke to Dr. Roark on the phone, he says from ENT standpoint patient has received full dose of steroids for upper airway swelling secondary to her abscess.  Can DC IV steroids after today.

## 2024-05-25 ENCOUNTER — Other Ambulatory Visit (HOSPITAL_COMMUNITY): Payer: Self-pay

## 2024-05-25 ENCOUNTER — Inpatient Hospital Stay (HOSPITAL_COMMUNITY)

## 2024-05-25 DIAGNOSIS — I319 Disease of pericardium, unspecified: Secondary | ICD-10-CM | POA: Diagnosis not present

## 2024-05-25 DIAGNOSIS — J9851 Mediastinitis: Secondary | ICD-10-CM | POA: Diagnosis not present

## 2024-05-25 DIAGNOSIS — J387 Other diseases of larynx: Secondary | ICD-10-CM | POA: Diagnosis not present

## 2024-05-25 DIAGNOSIS — I3139 Other pericardial effusion (noninflammatory): Secondary | ICD-10-CM | POA: Diagnosis not present

## 2024-05-25 DIAGNOSIS — J390 Retropharyngeal and parapharyngeal abscess: Secondary | ICD-10-CM | POA: Diagnosis not present

## 2024-05-25 LAB — ECHOCARDIOGRAM LIMITED
Height: 69 in
S' Lateral: 3.6 cm
Weight: 2720 [oz_av]

## 2024-05-25 LAB — BASIC METABOLIC PANEL WITH GFR
Anion gap: 9 (ref 5–15)
BUN: 27 mg/dL — ABNORMAL HIGH (ref 6–20)
CO2: 23 mmol/L (ref 22–32)
Calcium: 8.7 mg/dL — ABNORMAL LOW (ref 8.9–10.3)
Chloride: 104 mmol/L (ref 98–111)
Creatinine, Ser: 0.68 mg/dL (ref 0.44–1.00)
GFR, Estimated: >60 mL/min (ref >60)
Glucose, Bld: 135 mg/dL — ABNORMAL HIGH (ref 70–99)
Potassium: 3.6 mmol/L (ref 3.5–5.1)
Sodium: 136 mmol/L (ref 135–145)

## 2024-05-25 LAB — MAGNESIUM: Magnesium: 2.7 mg/dL — ABNORMAL HIGH (ref 1.7–2.4)

## 2024-05-25 LAB — SEDIMENTATION RATE: Sed Rate: 77 mm/h — ABNORMAL HIGH (ref 0–22)

## 2024-05-25 LAB — C-REACTIVE PROTEIN: CRP: 41 mg/dL — ABNORMAL HIGH (ref <1.0)

## 2024-05-25 MED ORDER — COLCHICINE 0.6 MG PO TABS
0.6000 mg | ORAL_TABLET | Freq: Two times a day (BID) | ORAL | 2 refills | Status: DC
  Filled 2024-05-25: qty 60, 30d supply, fill #0

## 2024-05-25 MED ORDER — AMOXICILLIN-POT CLAVULANATE 875-125 MG PO TABS
1.0000 | ORAL_TABLET | Freq: Two times a day (BID) | ORAL | 1 refills | Status: DC
  Filled 2024-05-25: qty 60, 30d supply, fill #0

## 2024-05-25 MED ORDER — ACETAMINOPHEN 160 MG/5ML PO SOLN: 650.0000 mg | Freq: Four times a day (QID) | ORAL | Status: DC | PRN

## 2024-05-25 MED ORDER — OXYCODONE HCL 5 MG PO TABS
2.5000 mg | ORAL_TABLET | ORAL | 0 refills | Status: DC | PRN
  Filled 2024-05-25: qty 5, 2d supply, fill #0

## 2024-05-25 MED ORDER — COLCHICINE 0.6 MG PO TABS
0.6000 mg | ORAL_TABLET | Freq: Two times a day (BID) | ORAL | Status: DC
  Administered 2024-05-25: 0.6 mg via ORAL
  Filled 2024-05-25: qty 1

## 2024-05-25 NOTE — Progress Notes (Signed)
 Echocardiogram 2D Echocardiogram has been performed.  Kyrese Gartman N Kordel Leavy,RDCS 05/25/2024, 10:57 AM

## 2024-05-25 NOTE — Assessment & Plan Note (Deleted)
 ID recs: DC on PO Augmentin  875/125 mg twice daily for two months; repeat CT 1 month from prior; follow up in clinic on 7/21 ENT recs: can DC IV dexamethasone  7/12; switch to PO per card for pericarditis?  Symptoms of dyspnea and pain improving, will continue abx and steroids. Pt reporting poor sleep and restlessness, suspect related to steroids, and so will retime. - Abx: IV Unasyn  3g q6h - Will switch to PO Augmentin , ID to help determine timing - Steroids: IV Dexamethasone  10 mg daily - Will move steroid to earlier in day - Pain: DC IV ketorolac ; start PO acetaminophen  q5h 650 mg (syrup form); start PO oxy 2.5 q4h (crushed) - Chlorhexidine  oral rinses twice daily - ENT following, appreciate recommendations - Will defer to ENT about length of steroid course - ID following, appreciate recommendations - Will defer to ID about switch to PO abx, time course for repeat imaging (3 days?)

## 2024-05-25 NOTE — Assessment & Plan Note (Deleted)
***  Ask cards: discharge on colchicine  0.6 mg BID? (Will dc IV dexamethasone  at dc) - Repeat limited echo per cards; done, results pending  Echo 6/10 with a small pericardial effusion, no tamponade. EKG with nonspecific ST abnormality, repeat largely unchanged; suspect contribution from pericarditis. Pt receiving IV Decadron . - Continue IV Decadron  - Cards consulted, appreciate recommendations - Consider addition of colchicine  at discharge

## 2024-05-25 NOTE — Assessment & Plan Note (Deleted)
   Urinalysis with protein 100, Cr 0.79. Ha1c 5.4%. No concern for kidney disease, prediabetes at this time. Most likely related to inflammatory picture. - Repeat urinalysis outpatient; no further inpatient workup indicated

## 2024-05-25 NOTE — Progress Notes (Signed)
  Progress Note Patient Name: Sue [name] Date of Encounter: 05/25/2024 [facility] Cardiologist: Soyla DELENA Merck, MD   Interval Summary   Feeling better , sob improving slowly.   Vital Signs Vitals:   05/24/24 2015 05/24/24 2304 05/25/24 0335 05/25/24 1004  BP: 126/84 102/70 123/78 117/74  Pulse: 89 79 74 71  Resp: 20 20 20  (!) 24  Temp: 97.6 F (36.4 C) 98.3 F (36.8 C) 98.2 F (36.8 C) 97.6 F (36.4 C)  TempSrc: Oral Oral Oral Oral  SpO2: 96% 95% 97% 98%  Weight:      Height:        Intake/Output Summary (Last 24 hours) at 05/25/2024 1159 Last data filed at 05/24/2024 2044 Gross per 24 hour  Intake 820 ml  Output --  Net 820 ml      05/23/2024    2:00 AM 10/10/2023    2:00 PM 05/26/2022   10:08 AM  Last 3 Weights  Weight (lbs) 170 lb 174 lb 171 lb  Weight (kg) 77.111 kg 78.926 kg 77.565 kg      Telemetry/ECG  SR, no heart block or ST changes noted - Personally Reviewed  Physical Exam  GEN: No acute distress.   Neck: No JVD Cardiac: RRR, no murmurs, rubs, or gallops.  Respiratory: Clear to auscultation bilaterally. GI: Soft, nontender, non-distended  MS: No edema  Assessment & Plan  Pericarditis with mediastinitis:  - was on dexamethasone  for abscess. Now that steroids stopped, transition to colchicine  0.6 mg BID for 3 months. Steroids not optimal long term treatment for pericarditis. Needs outpatient labs to follow both CBC and BMET at hospital follow up. D/w patient side effect of diarrhea, if that happens and intolerable, can cut dose in half 0.3 mg BID. - repeat limited echo with trivial effusion and no constrictive physiology noted. Would repeat echo in 4-6 weeks if symptoms persist, and would recheck esr crp. She notes a feeling of SOB at times with some mild chest pain but this has improved over hospital stay.  - if symptoms no better in 2-3 mo, will perform cardiac MRI.    Multifocal abscess: treatment per ID.  Dry mouth: Under  evaluation for Sjogren's disease by primary team.  Overall, stable for hospital dc from CV perspective.    For questions or updates, please contact [facility] Please consult [email or web address] for contact info under       Signed, Clarence Cogswell A Collan Schoenfeld, MD

## 2024-05-25 NOTE — Discharge Summary (Cosign Needed Addendum)
 Family Medicine Teaching [facility] Discharge Summary  Patient name: Sue [name] Medical record number: [id number] Date of birth: 07/28/1966 Age: 58 y.o. Gender: female Date of Admission: 05/22/2024  Date of Discharge: 05/25/2024 Admitting Physician: Raguel Ronnald Lee, DO  Primary Care Provider: Rudy Carlin LABOR, MD Consultants: ENT, ID, Cardiology  Indication for Hospitalization: ear, neck, chest pain 2/2 retropharyngeal and supraglottic abscesses and mediastinitis   Discharge Diagnoses/Problem List:  Principal Problem for Admission: supraglottic, retropharyngeal abscesses; pericarditis Other Problems addressed during stay:  Principal Problem:   Abscess of supraglottic region Active Problems:   Acute mediastinitis   Retropharyngeal abscess   Emphysema lung (HCC)   Abnormal EKG   Proteinuria   Pericarditis  Brief Hospital Course:  Sue [name] is a 58 y.o. female who was admitted to the [facility] Teaching Service at [facility] for deep space neck infection with extension into the mediastinum. Hospital course is outlined below by problem.   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  Previously diagnosed with AOM at urgent care and given amoxicillin  with antibiotic eardrops though was refractory.  CT neck and chest in the ED demonstrated supraglottic and retropharyngeal abscesses with mediastinitis.  ENT and cardiothoracic surgery consulted who felt no surgical intervention needed, ENT recommended outpatient follow up.  Started on IV antibiotics and IV dexamethasone  (7/10-7/11), discharged on 2 months of Augmentin , antibiotic course below.  Source felt to be otogenic vs autoimmune vs sequelae of prior radiation therapy (for breast cancer); poor dentition ruled out with orthopantogram done 7/10 negative for significant periodontal disease.  Antibiotic course: - IV Zosyn  (7/10), IV Vancomycin   (7/10) - IV Unasyn  (7/10-7/12) per ID recommendations - Discharged on 2 months of Augmentin  875-125 mg BID (7/12-9/6)  Pericarditis Patient with EKG showing nonspecific ST abnormality then with echo 7/10 showing small pericardial effusion.  Cardiology consulted and diagnosed with pericarditis.  Kept on IV dexamethasone  while inpatient.  Repeat limited echo on 7/12 showed trivial pericardial effusion.  Steroids discontinued at discharge and started on colchicine  0.6 mg BID for 3 months.  Other conditions that were chronic and stable: Emphysema  Issues for follow up: Repeat CBC, BMP in 1 week Follow-up emphysema (seen on CT done 7/9) Get repeat CT a month from last scan (schedule around 06/22/2024) Repeat TTE, ESR, and CRP in 4-6 weeks if symptoms persist Consider workup for Sjogren's (anti-Ro/SSA and anti-La/SSB antibodies, ANA, rheumatoid factor) Ensure referral and follow up with ENT outpatient, followed inpatient Follow-up with ID on 7/21 at 3:15; appointment already made  Results/Tests Pending at Time of Discharge:  Unresulted Labs (From admission, onward)     Start     Ordered   05/26/24 0500  Sedimentation rate  Tomorrow morning,   R       Question:  Specimen collection method  Answer:  Lab=Lab collect   05/25/24 1051           Disposition: Home  Discharge Condition: Stable  Discharge Exam:  Vitals:   05/25/24 0335 05/25/24 1004  BP: 123/78 117/74  Pulse: 74 71  Resp: 20 (!) 24  Temp: 98.2 F (36.8 C) 97.6 F (36.4 C)  SpO2: 97% 98%   Physical Exam General: Pt lying down in bed with head of bed elevated, no acute distress Neck: Supple; mild edema of right mid-lateral neck; no tenderness to palpation Cardiology: Regular rate and rhythm, no murmurs/rubs/gallops. Respiratory: Normal work of breathing on room air. Clear  to auscultation bilaterally; no wheezes, crackles. Abdomen: Bowel sounds present and normoactive bilaterally. Soft, nondistended,  nontender. Extremities: Skin warm, dry. No bilateral lower extremity edema. Neuro: Alert, appropriate responses to questions.  Significant Procedures: None  Significant Labs and Imaging:  Recent Labs  Lab 05/24/24 0343  WBC 5.5  HGB 12.2  HCT 34.9*  PLT 170   Recent Labs  Lab 05/24/24 0343 05/25/24 0321  NA 135 136  K 3.2* 3.6  CL 103 104  CO2 20* 23  GLUCOSE 114* 135*  BUN 30* 27*  CREATININE 0.79 0.68  CALCIUM 8.9 8.7*  MG  --  2.7*    CT Soft Tissue Neck 7/9 (abbreviated read): Large complex abscess involving the right aspect of the supraglottic airway. Additional component of retropharyngeal abscess.  Additional small peritonsillar abscesses in the inferior medial aspect of the right palatine tonsil.  Potential for extension of the collection to the mediastinum. Emphysema (ICD10-J43.9).  CT Chest 7/9 (abbreviated read): Constellation of findings suspicious for diffuse mediastinitis/mediastinal infection as described above. Small complex slightly organizing left posterior mediastinal fluid collection suspicious for abscess Mild diffuse circumferential esophageal thickening, presumably due to inflammation from diffuse inflammatory mediastinal process.  Emphysema.  Orthopantogram 7/10: No acute osseous findings or significant periodontal disease.   Discharge Medications:  Allergies as of 05/25/2024   No Known Allergies      Medication List     STOP taking these medications    amoxicillin  875 MG tablet Commonly known as: AMOXIL    NEOMYCIN -POLYMYXIN-HYDROCORTISONE 1 % Soln OTIC solution Commonly known as: CORTISPORIN       TAKE these medications    acetaminophen  160 MG/5ML solution Commonly known as: TYLENOL  Take 20.3 mLs (650 mg total) by mouth every 6 (six) hours as needed for mild pain (pain score 1-3) or moderate pain (pain score 4-6).   amoxicillin -clavulanate 875-125 MG tablet Commonly known as: AUGMENTIN  Take 1 tablet by mouth 2 (two) times  daily.   colchicine  0.6 MG tablet Take 1 tablet (0.6 mg total) by mouth 2 (two) times daily.   ibuprofen  800 MG tablet Commonly known as: ADVIL  Take 1 tablet (800 mg total) by mouth every 8 (eight) hours as needed (pain). Take with food to avoid stomach upset. Do not take any additional NSAIDs while on this. You may take tylenol  in addition to this if needed for extra pain relief.   oxyCODONE  5 MG immediate release tablet Commonly known as: Oxy IR/ROXICODONE  Take 0.5 tablets (2.5 mg total) by mouth every 4 (four) hours as needed for up to 10 doses for severe pain (pain score 7-10).       Discharge Instructions: Please refer to Patient Instructions section of EMR for full details.  Patient was counseled important signs and symptoms that should prompt return to medical care, changes in medications, dietary instructions, activity restrictions, and follow up appointments.   Follow-Up Appointments:  Follow-up Information     Rudy Carlin LABOR, MD. Schedule an appointment as soon as possible for a visit.   Specialty: Obstetrics and Gynecology Why: ASAP for hospital follow-up. Contact information: [address] [place] [address] [phone]         Madie Jon Garre, PA Follow up.   Specialties: Cardiology, Radiology Why: Hospital follow-up with Cardiology scheduled for 06/06/2024 at 2:45pm. Please arrive 15 minutes early for check-in. If this date/ time does not work for you, please call our office to reschedule. Contact information: [address] [place] [address] [phone]  Larraine Palma, MD 05/25/2024, 4:24 PM PGY-1, [facility]   FMTS Upper-Level Resident Attestation I have independently interviewed and examined the patient. I have discussed the above with Dr. Larraine and agree with the documented plan. My edits for correction/addition/clarification are included above. Please see any attending  notes.  Jamela Cumbo Toma, MD PGY-2, [facility] 05/25/2024 6:09 PM FPTS Service pager: [phone] (text pages welcome through AMION)

## 2024-05-25 NOTE — Discharge Instructions (Addendum)
 Dear Sue [name],  Thank you for letting us  participate in your care. You were hospitalized for sore throat and chest pain and diagnosed with Abscess of supraglottic region. You were treated with antibiotics and steroids. You will continue to take antibiotics outpatient.  You were also diagnosed with pericarditis, which is inflammation of the covering around your heart. This was treated with steroids, and you will continue to take an oral steroid outpatient.  POST-HOSPITAL & CARE INSTRUCTIONS Be sure to take all of your medications as listed in this packet. You can cut Augmentin  in half to take this easier! Go to your follow up appointments (listed below)  DOCTOR'S APPOINTMENT   - Infectious Disease: 7/21 at 3:15pm  Future Appointments  Date Time Provider Department Center  06/03/2024  3:15 PM Fleeta Rothman, Jomarie SAILOR, MD [facility]    Follow-up Information     Rudy Carlin LABOR, MD. Schedule an appointment as soon as possible for a visit.   Specialty: Obstetrics and Gynecology Why: ASAP for hospital follow-up. Contact information: [address] [place] [address] [phone]                 Take care and be well!  Family Medicine Teaching Service Inpatient Team [place]  [facility]  [address] [place], [place] [address] [phone]

## 2024-05-28 LAB — CULTURE, BLOOD (ROUTINE X 2)
Culture: NO GROWTH
Culture: NO GROWTH
Special Requests: ADEQUATE
Special Requests: ADEQUATE

## 2024-06-02 ENCOUNTER — Encounter: Payer: Self-pay | Admitting: Infectious Disease

## 2024-06-02 DIAGNOSIS — J853 Abscess of mediastinum: Secondary | ICD-10-CM | POA: Insufficient documentation

## 2024-06-02 NOTE — Progress Notes (Deleted)
 Subjective:  Chief complaint: follow-up for supraglottic and mediastinal abscesses   Patient ID: Sue [name], female    DOB: [birth year], 58 y.o.   MRN: [id number]  HPI  58 y.o. female with history of breast cancer who now is admitted with a large complex supraglottic abscess, peritonsillar abscesses with extension to hyoid with mediastinal abscess.  She also has pericarditis of the latter is likely reactive in nature rather than a purulent pericarditis.    Past Medical History:  Diagnosis Date   Breast cancer (HCC) 08/28/2012   Left Breast   Hemorrhoids    Personal history of radiation therapy    Right foot pain    S/P radiation therapy 12/06/12 -01/23/13   Left Breast/Axilla / 46 Gy / 23 Fractions with a Boost to Left Breast / 14 Gy / 7 Fractions   Use of tamoxifen  (Nolvadex ) 01/2013   Wears glasses     Past Surgical History:  Procedure Laterality Date   ACHILLES TENDON SURGERY Right 06/02/2020   Procedure: ACHILLES LENGTHENING/KIDNER;  Surgeon: Elsa Lonni SAUNDERS, MD;  Location: [facility];  Service: Orthopedics;  Laterality: Right;   BREAST LUMPECTOMY Left    takes tamoxifen    CALCANEAL OSTEOTOMY Right 06/02/2020   Procedure: RIGHT LATERAL DISPLACEMENT CALCANEAL OSTEOTOMY, FLEXOR DIGITORUM LONGUS TRANSFER, SPRING LIGAMENT RECONSTRUCTION, POSTERIOR TIBIAL TENDON DEBRIDEMENT, DEEP ORTHOPEDIC HARDWARE REMOVAL, MEDIAL CUNEIFORM PLANTAR FLEXION OSTEOTOMY AND ACHILLES LENGTHENING, PARTIAL RESECTION OF NAVICULAR;  Surgeon: Elsa Lonni SAUNDERS, MD;  Location: [facility];  Service: Orthopedics;  Latera   DILATION AND CURETTAGE OF UTERUS     Following Miscarriage   FOOT FUSION  3/09   ankle rt   HARDWARE REMOVAL Right 06/02/2020   Procedure: HARDWARE REMOVAL;  Surgeon: Elsa Lonni SAUNDERS, MD;  Location: [facility];  Service: Orthopedics;  Laterality: Right;   Left Breast Lumpectomy  08/28/12   Left Breast Needle Core Biopsy  07/26/12    UOQ - Ductal Carcinoma In Situ with Necrosis. Microcalcifications Identified   METATARSAL OSTEOTOMY Right 06/02/2020   Procedure: METATARSAL OSTEOTOMY;  Surgeon: Elsa Lonni SAUNDERS, MD;  Location: [facility];  Service: Orthopedics;  Laterality: Right;   RE-EXCISION OF BREAST CANCER,SUPERIOR MARGINS  10/30/2012   Procedure: RE-EXCISION OF BREAST CANCER,SUPERIOR MARGINS;  Surgeon: Elon CHRISTELLA Pacini, MD;  Location: [facility];  Service: General;  Laterality: N/A;  left partial mastectomy with excision of margins   re-excision of left breast cancer on 09/10/12      Family History  Problem Relation Age of Onset   Breast cancer Mother 41       blood cancer too   Brain cancer Paternal Aunt        diagnosed in late 23s to early 7s   Breast cancer Cousin        paternal cousin diagnosed; diagnosed in her late 52s   Colon cancer Neg Hx    Colon polyps Neg Hx    Esophageal cancer Neg Hx    Rectal cancer Neg Hx    Stomach cancer Neg Hx       Social History   Socioeconomic History   Marital status: Married    Spouse name: Not on file   Number of children: 1   Years of education: Not on file   Highest education level: Bachelor's degree (e.g., BA, AB, BS)  Occupational History   Not on file  Tobacco Use   Smoking status: Former    Current packs/day: 0.00  Types: Cigarettes    Quit date: 11/30/1993    Years since quitting: 30.5   Smokeless tobacco: Never  Vaping Use   Vaping status: Never Used  Substance and Sexual Activity   Alcohol use: Not Currently    Comment: 1x per week   Drug use: No   Sexual activity: Yes    Partners: Male    Birth control/protection: Post-menopausal  Other Topics Concern   Not on file  Social History Narrative   Not on file   Social Drivers of Health   Financial Resource Strain: Not on file  Food Insecurity: Not on file  Transportation Needs: No Transportation Needs (05/23/2024)   PRAPARE - Administrator, Civil Service  (Medical): No    Lack of Transportation (Non-Medical): No  Physical Activity: Not on file  Stress: Not on file  Social Connections: Unknown (03/29/2022)   Received from [facility]   Social Network    Social Network: Not on file    No Known Allergies   Current Outpatient Medications:    acetaminophen  (TYLENOL ) 160 MG/5ML solution, Take 20.3 mLs (650 mg total) by mouth every 6 (six) hours as needed for mild pain (pain score 1-3) or moderate pain (pain score 4-6)., Disp: , Rfl:    amoxicillin -clavulanate (AUGMENTIN ) 875-125 MG tablet, Take 1 tablet by mouth 2 (two) times daily., Disp: 60 tablet, Rfl: 1   colchicine  0.6 MG tablet, Take 1 tablet (0.6 mg total) by mouth 2 (two) times daily., Disp: 60 tablet, Rfl: 2   ibuprofen  (ADVIL ) 800 MG tablet, Take 1 tablet (800 mg total) by mouth every 8 (eight) hours as needed (pain). Take with food to avoid stomach upset. Do not take any additional NSAIDs while on this. You may take tylenol  in addition to this if needed for extra pain relief., Disp: 21 tablet, Rfl: 0   oxyCODONE  (OXY IR/ROXICODONE ) 5 MG immediate release tablet, Take 0.5 tablets (2.5 mg total) by mouth every 4 (four) hours as needed for up to 10 doses for severe pain (pain score 7-10)., Disp: 5 tablet, Rfl: 0   Review of Systems     Objective:   Physical Exam        Assessment & Plan:

## 2024-06-03 ENCOUNTER — Ambulatory Visit: Payer: Self-pay | Admitting: Infectious Disease

## 2024-06-03 DIAGNOSIS — C50419 Malignant neoplasm of upper-outer quadrant of unspecified female breast: Secondary | ICD-10-CM

## 2024-06-03 DIAGNOSIS — J853 Abscess of mediastinum: Secondary | ICD-10-CM

## 2024-06-03 DIAGNOSIS — J387 Other diseases of larynx: Secondary | ICD-10-CM

## 2024-06-03 DIAGNOSIS — I319 Disease of pericardium, unspecified: Secondary | ICD-10-CM

## 2024-06-05 ENCOUNTER — Telehealth: Payer: Self-pay | Admitting: Infectious Disease

## 2024-06-05 ENCOUNTER — Ambulatory Visit (INDEPENDENT_AMBULATORY_CARE_PROVIDER_SITE_OTHER): Admitting: Infectious Disease

## 2024-06-05 ENCOUNTER — Encounter: Payer: Self-pay | Admitting: Infectious Disease

## 2024-06-05 ENCOUNTER — Other Ambulatory Visit: Payer: Self-pay

## 2024-06-05 VITALS — BP 109/81 | HR 107 | Temp 97.6°F | Ht 69.0 in | Wt 156.0 lb

## 2024-06-05 DIAGNOSIS — R5383 Other fatigue: Secondary | ICD-10-CM | POA: Diagnosis not present

## 2024-06-05 DIAGNOSIS — J390 Retropharyngeal and parapharyngeal abscess: Secondary | ICD-10-CM

## 2024-06-05 DIAGNOSIS — J853 Abscess of mediastinum: Secondary | ICD-10-CM

## 2024-06-05 DIAGNOSIS — I319 Disease of pericardium, unspecified: Secondary | ICD-10-CM | POA: Diagnosis not present

## 2024-06-05 LAB — BASIC METABOLIC PANEL WITHOUT GFR
BUN/Creatinine Ratio: 20 (calc) (ref 6–22)
BUN: 9 mg/dL (ref 7–25)
CO2: 28 mmol/L (ref 20–32)
Calcium: 9.1 mg/dL (ref 8.6–10.4)
Chloride: 94 mmol/L — ABNORMAL LOW (ref 98–110)
Creat: 0.46 mg/dL — ABNORMAL LOW (ref 0.50–1.03)
Glucose, Bld: 96 mg/dL (ref 65–99)
Potassium: 3.2 mmol/L — ABNORMAL LOW (ref 3.5–5.3)
Sodium: 135 mmol/L (ref 135–146)

## 2024-06-05 LAB — CBC WITH DIFFERENTIAL/PLATELET
Absolute Lymphocytes: 1448 {cells}/uL (ref 850–3900)
Absolute Monocytes: 1040 {cells}/uL — ABNORMAL HIGH (ref 200–950)
Basophils Absolute: 20 {cells}/uL (ref 0–200)
Basophils Relative: 0.2 %
Eosinophils Absolute: 10 {cells}/uL — ABNORMAL LOW (ref 15–500)
Eosinophils Relative: 0.1 %
HCT: 35.4 % (ref 35.0–45.0)
Hemoglobin: 11.5 g/dL — ABNORMAL LOW (ref 11.7–15.5)
MCH: 32.5 pg (ref 27.0–33.0)
MCHC: 32.5 g/dL (ref 32.0–36.0)
MCV: 100 fL (ref 80.0–100.0)
MPV: 10.5 fL (ref 7.5–12.5)
Monocytes Relative: 10.2 %
Neutro Abs: 7681 {cells}/uL (ref 1500–7800)
Neutrophils Relative %: 75.3 %
Platelets: 228 Thousand/uL (ref 140–400)
RBC: 3.54 Million/uL — ABNORMAL LOW (ref 3.80–5.10)
RDW: 12.7 % (ref 11.0–15.0)
Total Lymphocyte: 14.2 %
WBC: 10.2 Thousand/uL (ref 3.8–10.8)

## 2024-06-05 MED ORDER — AMOXICILLIN-POT CLAVULANATE 875-125 MG PO TABS: 1.0000 | ORAL_TABLET | Freq: Two times a day (BID) | ORAL | 4 refills | Status: DC

## 2024-06-05 NOTE — Progress Notes (Signed)
 Subjective:    Patient ID: Sue [name], female    DOB: [birth year], 58 y.o.   MRN: [id number]  HPI  Discussed the use of AI scribe software for clinical note transcription with the patient, who gave verbal consent to proceed.  History of Present Illness   Sue [name] is a 58 year old female who presents with ongoing symptoms following treatment for a supraglottic and mediastinal abscess.  She was hospitalized for a supraglottic and mediastinal abscess and initially treated with antibiotics and steroids. Surgery was not required in opinion of ENT and CT surgery who both saw her. . Her antibiotic regimen was adjusted to from vancomycin , zosyn  to unasynand then to Augmentin . She also was diagnosed with pericarditis but this was thought to be reactive rather than purulent. She was placed on colchicine  and is going to be seeing cardiology and having an MRI shortly and following up with Cardiology.  She experiences significant fatigue, which she attributes to the antibiotics, and has been on leave from work since May 20, 2024. I anticipate if she continues to feel so poorly, and currently she feels sufficiently poorly that she is unable to return to work.        Given that she will need protracted antibiotics and repeat CT neck and chest until her abscesses have resolved--this may take several months and I have tentatively set her return day to work for early October in her FMLA paperwork.   She feels exhausted despite some improvement in swelling. She manages her medication by taking pills with food. She does not feel worse than during her hospital stay. She is scheduled for an MRI of her heart and is following up with imaging as part of her ongoing care.  She has no sore throat or difficulty swallowing and can swallow pills without issue.       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   Personal history of radiation therapy    Right foot pain    S/P radiation therapy 12/06/12 -01/23/13   Left Breast/Axilla / 46 Gy / 23 Fractions with a Boost to Left Breast / 14 Gy / 7 Fractions   Use of tamoxifen  (Nolvadex ) 01/2013   Wears glasses     Past Surgical History:  Procedure Laterality Date   ACHILLES TENDON SURGERY Right 06/02/2020   Procedure: ACHILLES LENGTHENING/KIDNER;  Surgeon: Elsa Lonni SAUNDERS, MD;  Location: [facility];  Service: Orthopedics;  Laterality: Right;   BREAST LUMPECTOMY Left    takes tamoxifen    CALCANEAL OSTEOTOMY Right 06/02/2020   Procedure: RIGHT LATERAL DISPLACEMENT CALCANEAL OSTEOTOMY, FLEXOR DIGITORUM LONGUS TRANSFER, SPRING LIGAMENT RECONSTRUCTION, POSTERIOR TIBIAL TENDON DEBRIDEMENT, DEEP ORTHOPEDIC HARDWARE REMOVAL, MEDIAL CUNEIFORM PLANTAR FLEXION OSTEOTOMY AND ACHILLES LENGTHENING, PARTIAL RESECTION OF NAVICULAR;  Surgeon: Elsa Lonni SAUNDERS, MD;  Location: [facility];  Service: Orthopedics;  Latera   DILATION AND CURETTAGE OF UTERUS     Following Miscarriage   FOOT FUSION  3/09   ankle rt   HARDWARE REMOVAL Right 06/02/2020   Procedure: HARDWARE REMOVAL;  Surgeon: Elsa Lonni SAUNDERS, MD;  Location:  SURGERY CENTER;  Service: Orthopedics;  Laterality: Right;   Left Breast Lumpectomy  08/28/12   Left Breast Needle Core Biopsy  07/26/12   UOQ - Ductal Carcinoma In Situ with Necrosis. Microcalcifications Identified   METATARSAL OSTEOTOMY Right 06/02/2020   Procedure:  METATARSAL OSTEOTOMY;  Surgeon: Elsa Lonni SAUNDERS, MD;  Location: [facility];  Service: Orthopedics;  Laterality: Right;   RE-EXCISION OF BREAST CANCER,SUPERIOR MARGINS  10/30/2012   Procedure: RE-EXCISION OF BREAST CANCER,SUPERIOR MARGINS;  Surgeon: Elon CHRISTELLA Pacini, MD;  Location: [facility];  Service: General;  Laterality: N/A;  left partial mastectomy with excision of margins   re-excision of left breast cancer on  09/10/12      Family History  Problem Relation Age of Onset   Breast cancer Mother 83       blood cancer too   Brain cancer Paternal Aunt        diagnosed in late 51s to early 37s   Breast cancer Cousin        paternal cousin diagnosed; diagnosed in her late 25s   Colon cancer Neg Hx    Colon polyps Neg Hx    Esophageal cancer Neg Hx    Rectal cancer Neg Hx    Stomach cancer Neg Hx       Social History   Socioeconomic History   Marital status: Married    Spouse name: Not on file   Number of children: 1   Years of education: Not on file   Highest education level: Bachelor's degree (e.g., BA, AB, BS)  Occupational History   Not on file  Tobacco Use   Smoking status: Former    Current packs/day: 0.00    Types: Cigarettes    Quit date: 11/30/1993    Years since quitting: 30.5   Smokeless tobacco: Never  Vaping Use   Vaping status: Never Used  Substance and Sexual Activity   Alcohol use: Not Currently    Comment: 1x per week   Drug use: No   Sexual activity: Yes    Partners: Male    Birth control/protection: Post-menopausal  Other Topics Concern   Not on file  Social History Narrative   Not on file   Social Drivers of Health   Financial Resource Strain: Not on file  Food Insecurity: Not on file  Transportation Needs: No Transportation Needs (05/23/2024)   PRAPARE - Administrator, Civil Service (Medical): No    Lack of Transportation (Non-Medical): No  Physical Activity: Not on file  Stress: Not on file  Social Connections: Unknown (03/29/2022)   Received from [facility]   Social Network    Social Network: Not on file    No Known Allergies   Current Outpatient Medications:    acetaminophen  (TYLENOL ) 160 MG/5ML solution, Take 20.3 mLs (650 mg total) by mouth every 6 (six) hours as needed for mild pain (pain score 1-3) or moderate pain (pain score 4-6)., Disp: , Rfl:    amoxicillin -clavulanate (AUGMENTIN ) 875-125 MG tablet, Take 1 tablet by  mouth 2 (two) times daily., Disp: 60 tablet, Rfl: 1   colchicine  0.6 MG tablet, Take 1 tablet (0.6 mg total) by mouth 2 (two) times daily., Disp: 60 tablet, Rfl: 2   ibuprofen  (ADVIL ) 800 MG tablet, Take 1 tablet (800 mg total) by mouth every 8 (eight) hours as needed (pain). Take with food to avoid stomach upset. Do not take any additional NSAIDs while on this. You may take tylenol  in addition to this if needed for extra pain relief., Disp: 21 tablet, Rfl: 0   oxyCODONE  (OXY IR/ROXICODONE ) 5 MG immediate release tablet, Take 0.5 tablets (2.5 mg total) by mouth every 4 (four) hours as needed for up to 10 doses for severe pain (pain  score 7-10). (Patient not taking: Reported on 06/05/2024), Disp: 5 tablet, Rfl: 0   Review of Systems  Constitutional:  Positive for fatigue. Negative for activity change, appetite change, chills, diaphoresis, fever and unexpected weight change.  HENT:  Negative for congestion, rhinorrhea, sinus pressure, sneezing, sore throat and trouble swallowing.   Eyes:  Negative for photophobia and visual disturbance.  Respiratory:  Negative for cough, chest tightness, shortness of breath, wheezing and stridor.   Cardiovascular:  Negative for chest pain, palpitations and leg swelling.  Gastrointestinal:  Negative for abdominal distention, abdominal pain, anal bleeding, blood in stool, constipation, diarrhea, nausea and vomiting.  Genitourinary:  Negative for difficulty urinating, dysuria, flank pain and hematuria.  Musculoskeletal:  Negative for arthralgias, back pain, gait problem, joint swelling and myalgias.  Skin:  Negative for color change, pallor, rash and wound.  Neurological:  Positive for weakness. Negative for dizziness, tremors and light-headedness.  Hematological:  Negative for adenopathy. Does not bruise/bleed easily.  Psychiatric/Behavioral:  Negative for agitation, behavioral problems, confusion, decreased concentration, dysphoric mood and sleep disturbance.         Objective:   Physical Exam Constitutional:      General: She is not in acute distress.    Appearance: Normal appearance. She is well-developed. She is not ill-appearing or diaphoretic.  HENT:     Head: Normocephalic and atraumatic.     Right Ear: Hearing and external ear normal.     Left Ear: Hearing and external ear normal.     Nose: No nasal deformity or rhinorrhea.  Eyes:     General: No scleral icterus.    Conjunctiva/sclera: Conjunctivae normal.     Right eye: Right conjunctiva is not injected.     Left eye: Left conjunctiva is not injected.     Pupils: Pupils are equal, round, and reactive to light.  Neck:     Vascular: No JVD.  Cardiovascular:     Rate and Rhythm: Normal rate and regular rhythm.     Heart sounds: S1 normal and S2 normal.  Pulmonary:     Effort: Pulmonary effort is normal. No respiratory distress.     Breath sounds: No wheezing.  Abdominal:     General: Bowel sounds are normal. There is no distension.     Palpations: Abdomen is soft.     Tenderness: There is no abdominal tenderness.  Musculoskeletal:        General: Normal range of motion.     Right shoulder: Normal.     Left shoulder: Normal.     Cervical back: Normal range of motion and neck supple. No edema, erythema or rigidity. Normal range of motion.     Right hip: Normal.     Left hip: Normal.     Right knee: Normal.     Left knee: Normal.  Lymphadenopathy:     Head:     Right side of head: No submandibular, preauricular or posterior auricular adenopathy.     Left side of head: No submandibular, preauricular or posterior auricular adenopathy.     Cervical: No cervical adenopathy.     Right cervical: No superficial or deep cervical adenopathy.    Left cervical: No superficial or deep cervical adenopathy.  Skin:    General: Skin is warm and dry.     Coloration: Skin is not pale.     Findings: No abrasion, bruising, ecchymosis, erythema, lesion or rash.     Nails: There is no clubbing.   Neurological:  Mental Status: She is alert and oriented to person, place, and time.     Sensory: No sensory deficit.     Coordination: Coordination normal.     Gait: Gait normal.  Psychiatric:        Attention and Perception: She is attentive.        Speech: Speech normal.        Behavior: Behavior normal. Behavior is cooperative.        Thought Content: Thought content normal.        Judgment: Judgment normal.           Assessment & Plan:   Assessment and Plan    Supraglottic and mediastinal abscess oral flora likely culprit organism and therefore we are using augmentin  I have sent in another month of this with refills I am ordering CBC w diff, BMP w GFR today I am ordering CT scan of the chest and neck  in one month.   Pericarditis: likely reactive, improving with colchicine  and treating the underlying infection. MRI upcoming and appt with Cardiology  Exhaustion due to illness and antibiotic side effects Exhaustion linked to illness and antibiotic side effects. No infection symptom worsening. - Order blood tests to assess health status.      I have personally spent 40 minutes involved in face-to-face and non-face-to-face activities for this patient on the day of the visit. Professional time spent includes the following activities: Preparing to see the patient (review of tests), Obtaining and/or reviewing separately obtained history (admission/discharge record), Performing a medically appropriate examination and/or evaluation , Ordering medications/tests/procedures, referring and communicating with other health care professionals, Documenting clinical information in the EMR, Independently interpreting results (not separately reported), Communicating results to the patient/family/caregiver, Counseling and educating the patient/family/caregiver and Care coordination (not separately reported).

## 2024-06-05 NOTE — Progress Notes (Signed)
 Cardiology Office Note:  .   Date:  06/05/2024  ID:  Sue [name] [place], DOB 1966/03/18, MRN [id number] PCP: Sue [name] LABOR, MD  [facility] Providers Cardiologist:  Soyla LABOR Merck, MD {  History of Present Illness: .   Sue [name] is a 58 y.o. female  with PMHx of pericarditis (diagnosed 05/23/2024 in the setting of supraglottic and retropharyngeal abscesses, mediastinitis), left breast cancer s/p radiation therapy and lumpectomy treated with tamoxifen  who reports to [facility] office for follow up.   She was [facility] seen by [facility] during hospitalization 7/10-7/10/2024 for pericarditis related to supraglottic and retropharyngeal abscesses, mediastinitis. Symptoms included pleuritic, positional chest pain. EKG showed NSR with diffuse ST elevation in the inferior lateral leads.  Echo 05/23/2024 showed EF 55 to 60%, small pericardial effusion with no tamponade, mild MVR, mild aortic calcification/sclerosis without  AS, borderline dilation of ascending aorta at 39 mm. Limited echo 05/25/2024 showed EF 55 to 60%, trivial pericardial effusion with no tamponade, trivial MVR. She was treated with IV antibiotics, steroids, discharged on Augmentin  x 2 months and colchicine  0.6 mg BID x 3 months, and referred to ENT and Infectious Disease.   Today, patient initially appeared anxious and agitated, reporting she thought this was an MRI appointment and not a clinic visit, leading to an anxiety attack. She has not been formal diagnosed with anxiety and is not on anxiety medications. BP elevated in office 136/92 then repeat BP 140/80. Reports recent office visit with normal BP. Per chart review, previous ID visit (06/05/2024) BP was 109/81. Patient denies any HTN history.   Reports significant improvement since hospitalization and denies any pericarditis like chest pain. Denies any CP, SOB, edema, palpitations, dizziness or syncope. Diarrhea occurs daily but is mild and not concerning. She has already  decreased colchicine  to 0.3 mg BID per prior recommendations. Reports compliance with medications. She is able to walk long distance without any exertional concerns. Quit smoking in 1995. Denies alcohol use/drug use.  ROS: 10 point review of system has been reviewed and considered negative except ones been listed in the HPI.   Studies Reviewed: .   Echo IMPRESSIONS 05/23/2024  1. Left ventricular ejection fraction, by estimation, is 55 to 60%. Left  ventricular ejection fraction by 3D volume is 56 %. The left ventricle has  normal function. The left ventricle has no regional wall motion  abnormalities. Left ventricular diastolic   parameters were normal. The average left ventricular global longitudinal  strain is -17.4 %. The global longitudinal strain is normal.   2. Right ventricular systolic function is normal. The right ventricular  size is normal. There is normal pulmonary artery systolic pressure. The  estimated right ventricular systolic pressure is 26.8 mmHg.   3. A small pericardial effusion is present. The pericardial effusion is  posterior to the left ventricle and lateral to the left ventricle. There  is no evidence of cardiac tamponade.   4. The mitral valve is normal in structure. Mild mitral valve  regurgitation. No evidence of mitral stenosis.   5. The aortic valve is tricuspid. There is mild calcification of the  aortic valve. Aortic valve regurgitation is not visualized. Aortic valve  sclerosis/calcification is present, without any evidence of aortic  stenosis.   6. Aortic dilatation noted. There is borderline dilatation of the  ascending aorta, measuring 39 mm.   7. The inferior vena cava is normal in size with greater than 50%  respiratory variability, suggesting right atrial pressure of 3 mmHg.  Conclusion(s)/Recommendation(s): Very small pericardial effusion posterior  and lateral to the LV. No tamponade.   Echo limited IMPRESSIONS 05/25/2024  1. Left ventricular  ejection fraction, by estimation, is 55 to 60%. The  left ventricle has normal function. The average left ventricular global  longitudinal strain is -19.0 %. The global longitudinal strain is normal.   2. Right ventricular systolic function is normal. The right ventricular  size is normal.   3. Trivial pericardial effusion with no tamponade physiology.   4. The mitral valve is grossly normal. Trivial mitral valve  regurgitation.   5. The aortic valve is tricuspid.   6. The inferior vena cava is normal in size with greater than 50%  respiratory variability, suggesting right atrial pressure of 3 mmHg.   Physical Exam:   VS:  LMP 10/24/2018    Wt Readings from Last 3 Encounters:  06/05/24 156 lb (70.8 kg)  05/23/24 170 lb (77.1 kg)  10/10/23 174 lb (78.9 kg)    GEN: Appear very anxious and agitated while sitting in chair. She was shaking her legs throughout interview.  NECK: No JVD; No carotid bruits CARDIAC: RRR, no murmurs, rubs, gallops RESPIRATORY:  Clear to auscultation without rales, wheezing or rhonchi  ABDOMEN: Soft, non-tender, non-distended EXTREMITIES:  No edema; No deformity   ASSESSMENT AND PLAN: .   Pericarditis Recent hospitalization 7/10-10/2024 for pericarditis due to supraglottic and retropharyngeal abscesses, mediastinitis. Symptoms included pleuritic, positional chest pain.  Echo 05/23/2024 showed EF 55 to 60%, small pericardial effusion with no tamponade, mild MVR, mild aortic calcification/sclerosis without  AS, borderline dilation of ascending aorta at 39 mm.   Limited echo 05/25/2024 showed EF 55 to 60%, trivial pericardial effusion with no tamponade, trivial MVR.  Reports significant improvement since hospitalization and denies any pericarditis like chest pain. Reports daily diarrhea but is not concerning. She already decreased to 0.3 mg BID as recommended.   Reports mild diarrhea related to colchicine , already decreased dose to 0.3 mg BID Recommend further  decrease colchicine  to 0.3 mg daily, discontinue 08/25/2024.  Discontinue Ibuprofen .  No need for repeat echo at this time unless symptoms recur; if recurrent symptoms, consider echo and possibly cardiac MRI.  Anxiety Patient was anxious and experienced an anxiety attack due to misunderstanding the visit purpose. She thought she was coming for a MRI, was not aware it was an office visit and was not expecting vital measurements or EKG. Provided reassurance and relaxation techniques; patient reported she had already attempted deep breathing  She has not been formal diagnosed with anxiety and is not on anxiety medications. Encouraged to follow up with PCP.   Elevated BP without diagnosis of hypertension  BP elevated in office 136/92 then repeat BP 140/80. Reports recent office visit with normal BP. Per chart review, BP recorded as 109/81 at ID visit on 06/05/2024. Patient denies any previous HTN concerns.  Offered EKG but patient declined.  Discussed monitoring BP at home but patient declined.  BP most likely elevated due to anxiety as above.   Patient left prior to receiving AVS at office visit but states understanding of plan and agrees.     Dispo: Follow up in 1-2 months with Dr. Loni or APP.   Signed, Lorette CINDERELLA Kapur, PA-C

## 2024-06-05 NOTE — Telephone Encounter (Signed)
 Tereza returned to the office to provide the fax information for her FMLA paperwork to Dr. Fleeta Rothman.  Attn: HR MGR - Silvia Saggs [phone]

## 2024-06-05 NOTE — Telephone Encounter (Signed)
 Sue [name] LVM to correct the fax number she previously provided. She requested to resend the FMLA forms via fax directly to Silvia Saggs at [phone].

## 2024-06-05 NOTE — Progress Notes (Deleted)
 Cardiology Office Note:    Date:  06/05/2024   ID:  Sue [name], DOB 1966/06/16, MRN [id number]  PCP:  Rudy Carlin LABOR, MD   [facility] Providers Cardiologist:  Soyla LABOR Merck, MD { Click to update primary MD,subspecialty MD or APP then REFRESH:1}    Referring MD: Rudy Carlin LABOR, MD   No chief complaint on file. ***  History of Present Illness:    Sue [name] is a 58 y.o. female with a hx of ***  Past Medical History:  Diagnosis Date   Breast cancer (HCC) 08/28/2012   Left Breast   Hemorrhoids    Mediastinal abscess (HCC) 06/02/2024   Personal history of radiation therapy    Right foot pain    S/P radiation therapy 12/06/12 -01/23/13   Left Breast/Axilla / 46 Gy / 23 Fractions with a Boost to Left Breast / 14 Gy / 7 Fractions   Use of tamoxifen  (Nolvadex ) 01/2013   Wears glasses     Past Surgical History:  Procedure Laterality Date   ACHILLES TENDON SURGERY Right 06/02/2020   Procedure: ACHILLES LENGTHENING/KIDNER;  Surgeon: Elsa Lonni SAUNDERS, MD;  Location: [facility];  Service: Orthopedics;  Laterality: Right;   BREAST LUMPECTOMY Left    takes tamoxifen    CALCANEAL OSTEOTOMY Right 06/02/2020   Procedure: RIGHT LATERAL DISPLACEMENT CALCANEAL OSTEOTOMY, FLEXOR DIGITORUM LONGUS TRANSFER, SPRING LIGAMENT RECONSTRUCTION, POSTERIOR TIBIAL TENDON DEBRIDEMENT, DEEP ORTHOPEDIC HARDWARE REMOVAL, MEDIAL CUNEIFORM PLANTAR FLEXION OSTEOTOMY AND ACHILLES LENGTHENING, PARTIAL RESECTION OF NAVICULAR;  Surgeon: Elsa Lonni SAUNDERS, MD;  Location: [facility];  Service: Orthopedics;  Latera   DILATION AND CURETTAGE OF UTERUS     Following Miscarriage   FOOT FUSION  3/09   ankle rt   HARDWARE REMOVAL Right 06/02/2020   Procedure: HARDWARE REMOVAL;  Surgeon: Elsa Lonni SAUNDERS, MD;  Location: [facility];  Service: Orthopedics;  Laterality: Right;   Left Breast Lumpectomy  08/28/12   Left Breast Needle Core Biopsy   07/26/12   UOQ - Ductal Carcinoma In Situ with Necrosis. Microcalcifications Identified   METATARSAL OSTEOTOMY Right 06/02/2020   Procedure: METATARSAL OSTEOTOMY;  Surgeon: Elsa Lonni SAUNDERS, MD;  Location:  SURGERY CENTER;  Service: Orthopedics;  Laterality: Right;   RE-EXCISION OF BREAST CANCER,SUPERIOR MARGINS  10/30/2012   Procedure: RE-EXCISION OF BREAST CANCER,SUPERIOR MARGINS;  Surgeon: Elon CHRISTELLA Pacini, MD;  Location: [facility];  Service: General;  Laterality: N/A;  left partial mastectomy with excision of margins   re-excision of left breast cancer on 09/10/12      Current Medications: No outpatient medications have been marked as taking for the 06/06/24 encounter (Appointment) with Madie Jon Garre, PA.     Allergies:   Patient has no known allergies.   Social History   Socioeconomic History   Marital status: Married    Spouse name: Not on file   Number of children: 1   Years of education: Not on file   Highest education level: Bachelor's degree (e.g., BA, AB, BS)  Occupational History   Not on file  Tobacco Use   Smoking status: Former    Current packs/day: 0.00    Types: Cigarettes    Quit date: 11/30/1993    Years since quitting: 30.5   Smokeless tobacco: Never  Vaping Use   Vaping status: Never Used  Substance and Sexual Activity   Alcohol use: Not Currently    Comment: 1x per week   Drug use: No  Sexual activity: Yes    Partners: Male    Birth control/protection: Post-menopausal  Other Topics Concern   Not on file  Social History Narrative   Not on file   Social Drivers of Health   Financial Resource Strain: Not on file  Food Insecurity: Not on file  Transportation Needs: No Transportation Needs (05/23/2024)   PRAPARE - Administrator, Civil Service (Medical): No    Lack of Transportation (Non-Medical): No  Physical Activity: Not on file  Stress: Not on file  Social Connections: Unknown (03/29/2022)   Received from [facility]   Social Network    Social Network: Not on file     Family History: The patient's ***family history includes Brain cancer in her paternal aunt; Breast cancer in her cousin; Breast cancer (age of onset: 74) in her mother. There is no history of Colon cancer, Colon polyps, Esophageal cancer, Rectal cancer, or Stomach cancer.  ROS:   Please see the history of present illness.    *** All other systems reviewed and are negative.  EKGs/Labs/Other Studies Reviewed:    The following studies were reviewed today: ***      Recent Labs: 05/22/2024: ALT 12 05/24/2024: Hemoglobin 12.2; Platelets 170 05/25/2024: BUN 27; Creatinine, Ser 0.68; Magnesium 2.7; Potassium 3.6; Sodium 136  Recent Lipid Panel No results found for: CHOL, TRIG, HDL, CHOLHDL, VLDL, LDLCALC, LDLDIRECT   Risk Assessment/Calculations:   {Does this patient have ATRIAL FIBRILLATION?:[phone]}            Physical Exam:    VS:  LMP 10/24/2018     Wt Readings from Last 3 Encounters:  06/05/24 156 lb (70.8 kg)  05/23/24 170 lb (77.1 kg)  10/10/23 174 lb (78.9 kg)     GEN: *** Well nourished, well developed in no acute distress HEENT: Normal NECK: No JVD; No carotid bruits LYMPHATICS: No lymphadenopathy CARDIAC: ***RRR, no murmurs, rubs, gallops RESPIRATORY:  Clear to auscultation without rales, wheezing or rhonchi  ABDOMEN: Soft, non-tender, non-distended MUSCULOSKELETAL:  No edema; No deformity  SKIN: Warm and dry NEUROLOGIC:  Alert and oriented x 3 PSYCHIATRIC:  Normal affect   ASSESSMENT:    No diagnosis found. PLAN:    In order of problems listed above:  ***      {Are you ordering a CV Procedure (e.g. stress test, cath, DCCV, TEE, etc)?   Press F2        :[id number]}    Medication Adjustments/Labs and Tests Ordered: Current medicines are reviewed at length with the patient today.  Concerns regarding medicines are outlined above.  No orders of the defined types were placed in  this encounter.  No orders of the defined types were placed in this encounter.   There are no Patient Instructions on file for this visit.   Signed, Jon Nat Hails, [place]  06/05/2024 11:12 AM    [facility]

## 2024-06-06 ENCOUNTER — Ambulatory Visit: Attending: Physician Assistant | Admitting: Physician Assistant

## 2024-06-06 ENCOUNTER — Ambulatory Visit: Admitting: Obstetrics

## 2024-06-06 VITALS — BP 136/92 | HR 121 | Ht 69.0 in | Wt 154.0 lb

## 2024-06-06 DIAGNOSIS — F419 Anxiety disorder, unspecified: Secondary | ICD-10-CM

## 2024-06-06 DIAGNOSIS — R030 Elevated blood-pressure reading, without diagnosis of hypertension: Secondary | ICD-10-CM | POA: Diagnosis not present

## 2024-06-06 DIAGNOSIS — I319 Disease of pericardium, unspecified: Secondary | ICD-10-CM

## 2024-06-06 NOTE — Telephone Encounter (Signed)
 Faxed paperwork on 7/23 and received confirmation. Placed copies in triage.

## 2024-06-06 NOTE — Patient Instructions (Signed)
 Medication Instructions:  Your physician has recommended you make the following change in your medication:  STOP IBUPROFEN  DECREASE COLCHICINE  TO 0.3 MG DAILY   *If you need a refill on your cardiac medications before your next appointment, please call your pharmacy*  Lab Work: NONE If you have labs (blood work) drawn today and your tests are completely normal, you will receive your results only by: MyChart Message (if you have MyChart) OR A paper copy in the mail If you have any lab test that is abnormal or we need to change your treatment, we will call you to review the results.  Testing/Procedures: NONE  Follow-Up: At [facility], you and your health needs are our priority.  As part of our continuing mission to provide you with exceptional heart care, our providers are all part of one team.  This team includes your primary Cardiologist (physician) and Advanced Practice Providers or APPs (Physician Assistants and Nurse Practitioners) who all work together to provide you with the care you need, when you need it.  Your next appointment:   1-2 month(s)  Provider:   Soyla DELENA Merck, MD  We recommend signing up for the patient portal called MyChart.  Sign up information is provided on this After Visit Summary.  MyChart is used to connect with patients for Virtual Visits (Telemedicine).  Patients are able to view lab/test results, encounter notes, upcoming appointments, etc.  Non-urgent messages can be sent to your provider as well.   To learn more about what you can do with MyChart, go to [email or web address].

## 2024-06-10 ENCOUNTER — Encounter: Payer: Self-pay | Admitting: Physician Assistant

## 2024-06-13 ENCOUNTER — Telehealth: Payer: Self-pay | Admitting: Internal Medicine

## 2024-06-13 ENCOUNTER — Telehealth: Payer: Self-pay

## 2024-06-13 MED ORDER — AMOXICILLIN-POT CLAVULANATE 875-125 MG PO TABS: 1.0000 | ORAL_TABLET | Freq: Two times a day (BID) | ORAL | 4 refills | Status: DC

## 2024-06-13 NOTE — Telephone Encounter (Signed)
*  STAT* If patient is at the pharmacy, call can be transferred to refill team.   1. Which medications need to be refilled? (please list name of each medication and dose if known)   colchicine  0.6 MG tablet     4. Which pharmacy/location (including street and city if local pharmacy) is medication to be sent to? [id number] [place] [place] [facility] [address] Phone: [phone]  Fax: [phone]       5. Do they need a 30 day or 90 day supply? 90

## 2024-06-13 NOTE — Telephone Encounter (Signed)
 Patient called [facility] [address]  Called patient back to let her know Rx has been transferred, she was not available. Notified her husband, Elspeth [facility]), and asked him to have her call if she has any trouble getting her medication.   He is asking how long she will have to deal with this. Discussed that provider sent in 5 months worth of refills and that she has appointment in September to follow up.   Cancelled Augmentin  Rx at [facility].   Breasia Karges, BSN, RN

## 2024-06-17 ENCOUNTER — Emergency Department (HOSPITAL_COMMUNITY)
Admission: RE | Admit: 2024-06-17 | Discharge: 2024-06-17 | Disposition: A | Discharge status: Home / Self Care | Source: Ambulatory Visit | Attending: Infectious Disease | Admitting: Infectious Disease

## 2024-06-17 ENCOUNTER — Ambulatory Visit (HOSPITAL_COMMUNITY)
Admission: RE | Admit: 2024-06-17 | Discharge: 2024-06-17 | Disposition: A | Discharge status: Home / Self Care | Source: Ambulatory Visit | Attending: Infectious Disease | Admitting: Infectious Disease

## 2024-06-17 DIAGNOSIS — D630 Anemia in neoplastic disease: Secondary | ICD-10-CM | POA: Diagnosis not present

## 2024-06-17 DIAGNOSIS — Z923 Personal history of irradiation: Secondary | ICD-10-CM | POA: Diagnosis not present

## 2024-06-17 DIAGNOSIS — Z978 Presence of other specified devices: Secondary | ICD-10-CM | POA: Diagnosis not present

## 2024-06-17 DIAGNOSIS — J439 Emphysema, unspecified: Secondary | ICD-10-CM | POA: Diagnosis not present

## 2024-06-17 DIAGNOSIS — I319 Disease of pericardium, unspecified: Secondary | ICD-10-CM | POA: Insufficient documentation

## 2024-06-17 DIAGNOSIS — F419 Anxiety disorder, unspecified: Secondary | ICD-10-CM | POA: Diagnosis not present

## 2024-06-17 DIAGNOSIS — B961 Klebsiella pneumoniae [K. pneumoniae] as the cause of diseases classified elsewhere: Secondary | ICD-10-CM | POA: Diagnosis not present

## 2024-06-17 DIAGNOSIS — K224 Dyskinesia of esophagus: Secondary | ICD-10-CM | POA: Diagnosis not present

## 2024-06-17 DIAGNOSIS — E872 Acidosis, unspecified: Secondary | ICD-10-CM | POA: Diagnosis not present

## 2024-06-17 DIAGNOSIS — J390 Retropharyngeal and parapharyngeal abscess: Secondary | ICD-10-CM | POA: Insufficient documentation

## 2024-06-17 DIAGNOSIS — A419 Sepsis, unspecified organism: Secondary | ICD-10-CM | POA: Diagnosis not present

## 2024-06-17 DIAGNOSIS — R5383 Other fatigue: Secondary | ICD-10-CM | POA: Diagnosis not present

## 2024-06-17 DIAGNOSIS — Z808 Family history of malignant neoplasm of other organs or systems: Secondary | ICD-10-CM | POA: Diagnosis not present

## 2024-06-17 DIAGNOSIS — J391 Other abscess of pharynx: Secondary | ICD-10-CM | POA: Diagnosis not present

## 2024-06-17 DIAGNOSIS — J852 Abscess of lung without pneumonia: Secondary | ICD-10-CM | POA: Diagnosis not present

## 2024-06-17 DIAGNOSIS — Z4682 Encounter for fitting and adjustment of non-vascular catheter: Secondary | ICD-10-CM | POA: Diagnosis not present

## 2024-06-17 DIAGNOSIS — J853 Abscess of mediastinum: Secondary | ICD-10-CM | POA: Insufficient documentation

## 2024-06-17 DIAGNOSIS — F322 Major depressive disorder, single episode, severe without psychotic features: Secondary | ICD-10-CM | POA: Diagnosis not present

## 2024-06-17 DIAGNOSIS — N951 Menopausal and female climacteric states: Secondary | ICD-10-CM | POA: Diagnosis not present

## 2024-06-17 DIAGNOSIS — Z803 Family history of malignant neoplasm of breast: Secondary | ICD-10-CM | POA: Diagnosis not present

## 2024-06-17 DIAGNOSIS — B3889 Other forms of coccidioidomycosis: Secondary | ICD-10-CM | POA: Diagnosis not present

## 2024-06-17 DIAGNOSIS — B966 Bacteroides fragilis [B. fragilis] as the cause of diseases classified elsewhere: Secondary | ICD-10-CM | POA: Diagnosis not present

## 2024-06-17 DIAGNOSIS — J90 Pleural effusion, not elsewhere classified: Secondary | ICD-10-CM | POA: Diagnosis not present

## 2024-06-17 DIAGNOSIS — F32A Depression, unspecified: Secondary | ICD-10-CM | POA: Diagnosis not present

## 2024-06-17 DIAGNOSIS — E43 Unspecified severe protein-calorie malnutrition: Secondary | ICD-10-CM | POA: Diagnosis not present

## 2024-06-17 DIAGNOSIS — J918 Pleural effusion in other conditions classified elsewhere: Secondary | ICD-10-CM | POA: Diagnosis not present

## 2024-06-17 DIAGNOSIS — R221 Localized swelling, mass and lump, neck: Secondary | ICD-10-CM | POA: Diagnosis not present

## 2024-06-17 DIAGNOSIS — J181 Lobar pneumonia, unspecified organism: Secondary | ICD-10-CM | POA: Diagnosis not present

## 2024-06-17 DIAGNOSIS — J869 Pyothorax without fistula: Secondary | ICD-10-CM | POA: Diagnosis not present

## 2024-06-17 DIAGNOSIS — R64 Cachexia: Secondary | ICD-10-CM | POA: Diagnosis not present

## 2024-06-17 DIAGNOSIS — E46 Unspecified protein-calorie malnutrition: Secondary | ICD-10-CM | POA: Diagnosis not present

## 2024-06-17 DIAGNOSIS — Z170 Estrogen receptor positive status [ER+]: Secondary | ICD-10-CM | POA: Diagnosis not present

## 2024-06-17 DIAGNOSIS — B954 Other streptococcus as the cause of diseases classified elsewhere: Secondary | ICD-10-CM | POA: Diagnosis not present

## 2024-06-17 DIAGNOSIS — Z853 Personal history of malignant neoplasm of breast: Secondary | ICD-10-CM | POA: Diagnosis not present

## 2024-06-17 DIAGNOSIS — J948 Other specified pleural conditions: Secondary | ICD-10-CM | POA: Diagnosis not present

## 2024-06-17 DIAGNOSIS — J189 Pneumonia, unspecified organism: Secondary | ICD-10-CM | POA: Diagnosis not present

## 2024-06-17 DIAGNOSIS — E876 Hypokalemia: Secondary | ICD-10-CM | POA: Diagnosis not present

## 2024-06-17 DIAGNOSIS — Z6822 Body mass index (BMI) 22.0-22.9, adult: Secondary | ICD-10-CM | POA: Diagnosis not present

## 2024-06-17 DIAGNOSIS — R627 Adult failure to thrive: Secondary | ICD-10-CM | POA: Diagnosis not present

## 2024-06-17 DIAGNOSIS — J9811 Atelectasis: Secondary | ICD-10-CM | POA: Diagnosis not present

## 2024-06-17 DIAGNOSIS — R634 Abnormal weight loss: Secondary | ICD-10-CM | POA: Diagnosis not present

## 2024-06-17 DIAGNOSIS — R000 Tachycardia, unspecified: Secondary | ICD-10-CM | POA: Diagnosis not present

## 2024-06-17 DIAGNOSIS — R918 Other nonspecific abnormal finding of lung field: Secondary | ICD-10-CM | POA: Diagnosis not present

## 2024-06-17 DIAGNOSIS — E871 Hypo-osmolality and hyponatremia: Secondary | ICD-10-CM | POA: Diagnosis not present

## 2024-06-17 DIAGNOSIS — J387 Other diseases of larynx: Secondary | ICD-10-CM | POA: Diagnosis not present

## 2024-06-17 DIAGNOSIS — Z87891 Personal history of nicotine dependence: Secondary | ICD-10-CM | POA: Diagnosis not present

## 2024-06-17 MED ORDER — IOHEXOL 300 MG/ML  SOLN
75.0000 mL | Freq: Once | INTRAMUSCULAR | Status: AC | PRN
  Administered 2024-06-17: 75 mL via INTRAVENOUS

## 2024-06-19 ENCOUNTER — Telehealth: Payer: Self-pay

## 2024-06-19 ENCOUNTER — Other Ambulatory Visit: Payer: Self-pay | Admitting: Infectious Disease

## 2024-06-19 MED ORDER — MEGESTROL ACETATE 40 MG PO TABS: 40.0000 mg | ORAL_TABLET | Freq: Two times a day (BID) | ORAL | 0 refills | Status: DC

## 2024-06-19 NOTE — Telephone Encounter (Signed)
 Patient called about CT results. Patient advised it has not been read by our radiologist yet. Patient also requested something to help with her appetite and she has been unable to eat.  Sue [name], CMA

## 2024-06-19 NOTE — Telephone Encounter (Signed)
 I sent in the pended rx you had to the pharmacy for the megace 

## 2024-06-19 NOTE — Telephone Encounter (Signed)
 [facility] on [address]

## 2024-06-20 ENCOUNTER — Inpatient Hospital Stay (HOSPITAL_COMMUNITY)
Admission: EM | Admit: 2024-06-20 | Discharge: 2024-06-28 | DRG: 871 | Disposition: A | Discharge status: Home Health | Source: Ambulatory Visit | Attending: Family Medicine | Admitting: Family Medicine

## 2024-06-20 ENCOUNTER — Ambulatory Visit: Payer: Self-pay

## 2024-06-20 ENCOUNTER — Emergency Department (HOSPITAL_COMMUNITY)

## 2024-06-20 ENCOUNTER — Encounter (HOSPITAL_COMMUNITY): Payer: Self-pay

## 2024-06-20 ENCOUNTER — Telehealth: Payer: Self-pay

## 2024-06-20 DIAGNOSIS — B999 Unspecified infectious disease: Secondary | ICD-10-CM | POA: Insufficient documentation

## 2024-06-20 DIAGNOSIS — E876 Hypokalemia: Secondary | ICD-10-CM | POA: Diagnosis present

## 2024-06-20 DIAGNOSIS — Z808 Family history of malignant neoplasm of other organs or systems: Secondary | ICD-10-CM | POA: Diagnosis not present

## 2024-06-20 DIAGNOSIS — B966 Bacteroides fragilis [B. fragilis] as the cause of diseases classified elsewhere: Secondary | ICD-10-CM | POA: Diagnosis present

## 2024-06-20 DIAGNOSIS — Z170 Estrogen receptor positive status [ER+]: Secondary | ICD-10-CM

## 2024-06-20 DIAGNOSIS — J391 Other abscess of pharynx: Secondary | ICD-10-CM | POA: Diagnosis not present

## 2024-06-20 DIAGNOSIS — E871 Hypo-osmolality and hyponatremia: Secondary | ICD-10-CM | POA: Diagnosis present

## 2024-06-20 DIAGNOSIS — F419 Anxiety disorder, unspecified: Secondary | ICD-10-CM | POA: Diagnosis not present

## 2024-06-20 DIAGNOSIS — J390 Retropharyngeal and parapharyngeal abscess: Secondary | ICD-10-CM | POA: Diagnosis present

## 2024-06-20 DIAGNOSIS — B954 Other streptococcus as the cause of diseases classified elsewhere: Secondary | ICD-10-CM | POA: Diagnosis present

## 2024-06-20 DIAGNOSIS — Z853 Personal history of malignant neoplasm of breast: Secondary | ICD-10-CM | POA: Diagnosis not present

## 2024-06-20 DIAGNOSIS — E872 Acidosis, unspecified: Secondary | ICD-10-CM | POA: Diagnosis present

## 2024-06-20 DIAGNOSIS — E43 Unspecified severe protein-calorie malnutrition: Secondary | ICD-10-CM | POA: Diagnosis present

## 2024-06-20 DIAGNOSIS — Z923 Personal history of irradiation: Secondary | ICD-10-CM

## 2024-06-20 DIAGNOSIS — D630 Anemia in neoplastic disease: Secondary | ICD-10-CM | POA: Diagnosis present

## 2024-06-20 DIAGNOSIS — E46 Unspecified protein-calorie malnutrition: Secondary | ICD-10-CM | POA: Diagnosis present

## 2024-06-20 DIAGNOSIS — J181 Lobar pneumonia, unspecified organism: Secondary | ICD-10-CM | POA: Diagnosis not present

## 2024-06-20 DIAGNOSIS — J439 Emphysema, unspecified: Secondary | ICD-10-CM | POA: Diagnosis present

## 2024-06-20 DIAGNOSIS — J853 Abscess of mediastinum: Principal | ICD-10-CM | POA: Diagnosis present

## 2024-06-20 DIAGNOSIS — B3889 Other forms of coccidioidomycosis: Secondary | ICD-10-CM | POA: Diagnosis not present

## 2024-06-20 DIAGNOSIS — D72829 Elevated white blood cell count, unspecified: Secondary | ICD-10-CM | POA: Insufficient documentation

## 2024-06-20 DIAGNOSIS — Z87891 Personal history of nicotine dependence: Secondary | ICD-10-CM

## 2024-06-20 DIAGNOSIS — R627 Adult failure to thrive: Secondary | ICD-10-CM | POA: Diagnosis present

## 2024-06-20 DIAGNOSIS — J90 Pleural effusion, not elsewhere classified: Secondary | ICD-10-CM | POA: Diagnosis not present

## 2024-06-20 DIAGNOSIS — R64 Cachexia: Secondary | ICD-10-CM | POA: Diagnosis present

## 2024-06-20 DIAGNOSIS — J387 Other diseases of larynx: Secondary | ICD-10-CM | POA: Diagnosis not present

## 2024-06-20 DIAGNOSIS — A419 Sepsis, unspecified organism: Principal | ICD-10-CM | POA: Diagnosis present

## 2024-06-20 DIAGNOSIS — Z6822 Body mass index (BMI) 22.0-22.9, adult: Secondary | ICD-10-CM | POA: Diagnosis not present

## 2024-06-20 DIAGNOSIS — R634 Abnormal weight loss: Secondary | ICD-10-CM | POA: Diagnosis present

## 2024-06-20 DIAGNOSIS — F32A Depression, unspecified: Secondary | ICD-10-CM | POA: Diagnosis present

## 2024-06-20 DIAGNOSIS — Z978 Presence of other specified devices: Secondary | ICD-10-CM | POA: Diagnosis not present

## 2024-06-20 DIAGNOSIS — Z803 Family history of malignant neoplasm of breast: Secondary | ICD-10-CM | POA: Diagnosis not present

## 2024-06-20 DIAGNOSIS — J189 Pneumonia, unspecified organism: Secondary | ICD-10-CM | POA: Diagnosis present

## 2024-06-20 DIAGNOSIS — J918 Pleural effusion in other conditions classified elsewhere: Secondary | ICD-10-CM | POA: Diagnosis present

## 2024-06-20 DIAGNOSIS — N951 Menopausal and female climacteric states: Secondary | ICD-10-CM | POA: Diagnosis not present

## 2024-06-20 DIAGNOSIS — B961 Klebsiella pneumoniae [K. pneumoniae] as the cause of diseases classified elsewhere: Secondary | ICD-10-CM | POA: Diagnosis present

## 2024-06-20 DIAGNOSIS — Z789 Other specified health status: Secondary | ICD-10-CM

## 2024-06-20 LAB — I-STAT CG4 LACTIC ACID, ED
Lactic Acid, Venous: 2.1 mmol/L (ref 0.5–1.9)
Lactic Acid, Venous: 1.7 mmol/L (ref 0.5–1.9)

## 2024-06-20 LAB — URINALYSIS, W/ REFLEX TO CULTURE (INFECTION SUSPECTED)
Bilirubin Urine: NEGATIVE
Glucose, UA: NEGATIVE mg/dL
Ketones, ur: NEGATIVE mg/dL
Nitrite: NEGATIVE
Protein, ur: NEGATIVE mg/dL
RBC / HPF: >50 RBC/hpf (ref 0–5)
Specific Gravity, Urine: 1.013 (ref 1.005–1.030)
WBC, UA: >50 WBC/hpf (ref 0–5)
pH: 5 (ref 5.0–8.0)

## 2024-06-20 LAB — CBC WITH DIFFERENTIAL/PLATELET
Abs Immature Granulocytes: 0.09 K/uL — ABNORMAL HIGH (ref 0.00–0.07)
Basophils Absolute: 0 K/uL (ref 0.0–0.1)
Basophils Relative: 0 %
Eosinophils Absolute: 0 K/uL (ref 0.0–0.5)
Eosinophils Relative: 0 %
HCT: 33.6 % — ABNORMAL LOW (ref 36.0–46.0)
Hemoglobin: 11 g/dL — ABNORMAL LOW (ref 12.0–15.0)
Immature Granulocytes: 1 %
Lymphocytes Relative: 16 %
Lymphs Abs: 2 K/uL (ref 0.7–4.0)
MCH: 31.8 pg (ref 26.0–34.0)
MCHC: 32.7 g/dL (ref 30.0–36.0)
MCV: 97.1 fL (ref 80.0–100.0)
Monocytes Absolute: 1.4 K/uL — ABNORMAL HIGH (ref 0.1–1.0)
Monocytes Relative: 11 %
Neutro Abs: 8.8 K/uL — ABNORMAL HIGH (ref 1.7–7.7)
Neutrophils Relative %: 72 %
Platelets: 613 K/uL — ABNORMAL HIGH (ref 150–400)
RBC: 3.46 MIL/uL — ABNORMAL LOW (ref 3.87–5.11)
RDW: 13.8 % (ref 11.5–15.5)
WBC: 12.3 K/uL — ABNORMAL HIGH (ref 4.0–10.5)
nRBC: 0 % (ref 0.0–0.2)

## 2024-06-20 LAB — COMPREHENSIVE METABOLIC PANEL WITH GFR
ALT: 12 U/L (ref 0–44)
AST: 22 U/L (ref 15–41)
Albumin: 2.3 g/dL — ABNORMAL LOW (ref 3.5–5.0)
Alkaline Phosphatase: 119 U/L (ref 38–126)
Anion gap: 15 (ref 5–15)
BUN: 6 mg/dL (ref 6–20)
CO2: 22 mmol/L (ref 22–32)
Calcium: 8.9 mg/dL (ref 8.9–10.3)
Chloride: 91 mmol/L — ABNORMAL LOW (ref 98–111)
Creatinine, Ser: 0.52 mg/dL (ref 0.44–1.00)
GFR, Estimated: >60 mL/min (ref >60)
Glucose, Bld: 115 mg/dL — ABNORMAL HIGH (ref 70–99)
Potassium: 3.2 mmol/L — ABNORMAL LOW (ref 3.5–5.1)
Sodium: 128 mmol/L — ABNORMAL LOW (ref 135–145)
Total Bilirubin: 0.5 mg/dL (ref 0.0–1.2)
Total Protein: 7.8 g/dL (ref 6.5–8.1)

## 2024-06-20 LAB — PROTIME-INR
INR: 1.1 (ref 0.8–1.2)
Prothrombin Time: 14.9 s (ref 11.4–15.2)

## 2024-06-20 MED ORDER — PIPERACILLIN-TAZOBACTAM 3.375 G IVPB 30 MIN: 3.3750 g | Freq: Four times a day (QID) | INTRAVENOUS | Status: DC

## 2024-06-20 MED ORDER — POTASSIUM CHLORIDE CRYS ER 20 MEQ PO TBCR: 40.0000 meq | EXTENDED_RELEASE_TABLET | Freq: Once | ORAL | Status: DC

## 2024-06-20 MED ORDER — VANCOMYCIN HCL 10 G IV SOLR: 20.0000 mg/kg | Freq: Once | INTRAVENOUS | Status: DC

## 2024-06-20 MED ORDER — MIDAZOLAM HCL 2 MG/2ML IJ SOLN
1.0000 mg | Freq: Once | INTRAMUSCULAR | Status: AC
  Administered 2024-06-20: 1 mg via INTRAVENOUS
  Filled 2024-06-20: qty 2

## 2024-06-20 MED ORDER — ENOXAPARIN SODIUM 40 MG/0.4ML IJ SOSY
40.0000 mg | PREFILLED_SYRINGE | INTRAMUSCULAR | Status: DC
  Administered 2024-06-23 – 2024-06-28 (×7): 40 mg via SUBCUTANEOUS
  Filled 2024-06-20 (×6): qty 0.4

## 2024-06-20 MED ORDER — LACTATED RINGERS IV BOLUS
1000.0000 mL | Freq: Once | INTRAVENOUS | Status: AC
  Administered 2024-06-20: 1000 mL via INTRAVENOUS

## 2024-06-20 MED ORDER — VANCOMYCIN HCL 750 MG/150ML IV SOLN: 750.0000 mg | Freq: Two times a day (BID) | INTRAVENOUS | Status: DC

## 2024-06-20 MED ORDER — PIPERACILLIN-TAZOBACTAM 3.375 G IVPB 30 MIN
3.3750 g | Freq: Once | INTRAVENOUS | Status: AC
  Administered 2024-06-20: 3.375 g via INTRAVENOUS
  Filled 2024-06-20: qty 50

## 2024-06-20 MED ORDER — VANCOMYCIN HCL 1500 MG/300ML IV SOLN
1500.0000 mg | Freq: Once | INTRAVENOUS | Status: AC
  Administered 2024-06-20: 1500 mg via INTRAVENOUS
  Filled 2024-06-20: qty 300

## 2024-06-20 MED ORDER — POTASSIUM CHLORIDE 10 MEQ/100ML IV SOLN
10.0000 meq | INTRAVENOUS | Status: AC
  Administered 2024-06-20 – 2024-06-21 (×4): 10 meq via INTRAVENOUS
  Filled 2024-06-20 (×4): qty 100

## 2024-06-20 MED ORDER — PIPERACILLIN-TAZOBACTAM 3.375 G IVPB
3.3750 g | Freq: Three times a day (TID) | INTRAVENOUS | Status: DC
  Administered 2024-06-21 – 2024-06-23 (×10): 3.375 g via INTRAVENOUS
  Filled 2024-06-20 (×10): qty 50

## 2024-06-20 MED ORDER — LACTATED RINGERS IV SOLN: INTRAVENOUS | Status: AC

## 2024-06-20 NOTE — ED Provider Notes (Signed)
 Sue [name]   CSN: [id number] Arrival date & time: 06/20/24  1559     Patient presents with: Emesis   Sue [name] is a 58 y.o. female.   58 yo F with a cc of a worsening abscess.  Patient was recently in the hospital with a mediastinal abscess.  She was thought to not require surgery and had gotten IV antibiotics and was transitioned to oral and was followed up yesterday by ID clinic and had repeat CT imaging.  This showed a worsening paraesophageal abscess and loculated pleural effusions.  She was then sent to the hospital for IV antibiotics and admission.  The patient tells me that she is very stressed out the things have gotten worse.  She thought that she was doing well at home.  Denies any fevers any difficulty breathing or swallowing.   Emesis      Prior to Admission medications   Medication Sig Start Date End Date Taking? Authorizing Provider  acetaminophen  (TYLENOL ) 500 MG tablet Take 1,500 mg by mouth as needed for mild pain (pain score 1-3) or moderate pain (pain score 4-6).   Yes [provider]  amoxicillin -clavulanate (AUGMENTIN ) 875-125 MG tablet Take 1 tablet by mouth 2 (two) times daily. 06/13/24  Yes Fleeta Rothman, Jomarie SAILOR, MD  colchicine  0.6 MG tablet Take 1 tablet (0.6 mg total) by mouth 2 (two) times daily. Patient not taking: Reported on 06/20/2024 05/25/24   Shitarev, Dimitry, MD  ibuprofen  (ADVIL ) 800 MG tablet Take 1 tablet (800 mg total) by mouth every 8 (eight) hours as needed (pain). Take with food to avoid stomach upset. Do not take any additional NSAIDs while on this. You may take tylenol  in addition to this if needed for extra pain relief. Patient not taking: Reported on 06/20/2024 05/20/24   Iola Lukes, FNP  megestrol  (MEGACE ) 40 MG tablet Take 1 tablet (40 mg total) by mouth 2 (two) times daily. Patient not taking: Reported on 06/20/2024 06/19/24   Fleeta Rothman, Jomarie SAILOR, MD  oxyCODONE  (OXY  IR/ROXICODONE ) 5 MG immediate release tablet Take 0.5 tablets (2.5 mg total) by mouth every 4 (four) hours as needed for up to 10 doses for severe pain (pain score 7-10). Patient not taking: No sig reported 05/25/24   Shitarev, Dimitry, MD    Allergies: Patient has no known allergies.    Review of Systems  Gastrointestinal:  Positive for vomiting.    Updated Vital Signs BP 118/82 (BP Location: Right Arm)   Pulse (!) 103   Temp 99.6 F (37.6 C) (Oral)   Resp 15   Ht 5' 9 (1.753 m)   Wt 69.9 kg   LMP 10/24/2018   SpO2 97%   BMI 22.74 kg/m   Physical Exam Vitals and nursing [name] reviewed.  Constitutional:      General: She is not in acute distress.    Appearance: She is well-developed. She is not diaphoretic.     Comments: Cachectic  HENT:     Head: Normocephalic and atraumatic.  Eyes:     Pupils: Pupils are equal, round, and reactive to light.  Cardiovascular:     Rate and Rhythm: Normal rate and regular rhythm.     Heart sounds: No murmur heard.    No friction rub. No gallop.  Pulmonary:     Effort: Pulmonary effort is normal.     Breath sounds: No wheezing or rales.  Abdominal:     General: There  is no distension.     Palpations: Abdomen is soft.     Tenderness: There is no abdominal tenderness.  Musculoskeletal:        General: No tenderness.     Cervical back: Normal range of motion and neck supple.  Skin:    General: Skin is warm and dry.  Neurological:     Mental Status: She is alert and oriented to person, place, and time.  Psychiatric:        Behavior: Behavior normal.     (all labs ordered are listed, but only abnormal results are displayed) Labs Reviewed  COMPREHENSIVE METABOLIC PANEL WITH GFR - Abnormal; Notable for the following components:      Result Value   Sodium 128 (*)    Potassium 3.2 (*)    Chloride 91 (*)    Glucose, Bld 115 (*)    Albumin 2.3 (*)    All other components within normal limits  CBC WITH DIFFERENTIAL/PLATELET -  Abnormal; Notable for the following components:   WBC 12.3 (*)    RBC 3.46 (*)    Hemoglobin 11.0 (*)    HCT 33.6 (*)    Platelets 613 (*)    Neutro Abs 8.8 (*)    Monocytes Absolute 1.4 (*)    Abs Immature Granulocytes 0.09 (*)    All other components within normal limits  I-STAT CG4 LACTIC ACID, ED - Abnormal; Notable for the following components:   Lactic Acid, Venous 2.1 (*)    All other components within normal limits  CULTURE, BLOOD (ROUTINE X 2)  CULTURE, BLOOD (ROUTINE X 2)  EXPECTORATED SPUTUM ASSESSMENT W GRAM STAIN, RFLX TO RESP C  PROTIME-INR  URINALYSIS, W/ REFLEX TO CULTURE (INFECTION SUSPECTED)  CBC WITH DIFFERENTIAL/PLATELET  BASIC METABOLIC PANEL WITH GFR  LEGIONELLA PNEUMOPHILA SEROGP 1 UR AG  I-STAT CG4 LACTIC ACID, ED    EKG: None  Radiology: DG Chest Port 1 View Result Date: 06/20/2024 CLINICAL DATA:  Sepsis EXAM: PORTABLE CHEST 1 VIEW COMPARISON:  05/22/2024, 06/17/2024 FINDINGS: Single frontal view of the chest demonstrates a stable cardiac silhouette. Persistent right basilar consolidation. Multilocular complex bilateral fluid collections, right greater than left, again noted and unchanged since earlier CT. No pneumothorax. No acute bony abnormalities. IMPRESSION: 1. Stable right basilar consolidation and complex multilocular bilateral pleural fluid collections, compatible with known history of mediastinal/pulmonary abscess as seen on recent CT. Electronically Signed   By: Ozell Daring M.D.   On: 06/20/2024 17:27     .Critical Care  Performed by: Emil Share, DO Authorized by: Emil Share, DO   Critical care provider statement:    Critical care time (minutes):  35   Critical care time was exclusive of:  Separately billable procedures and treating other patients   Critical care was time spent personally by me on the following activities:  Development of treatment plan with patient or surrogate, discussions with consultants, evaluation of patient's  response to treatment, examination of patient, ordering and review of laboratory studies, ordering and review of radiographic studies, ordering and performing treatments and interventions, pulse oximetry, re-evaluation of patient's condition and review of old charts   Care discussed with: admitting provider      Medications Ordered in the ED  enoxaparin  (LOVENOX ) injection 40 mg (has no administration in time range)  lactated ringers  infusion ( Intravenous New Bag/Given 06/20/24 2048)  potassium chloride  10 mEq in 100 mL IVPB (10 mEq Intravenous New Bag/Given 06/20/24 2152)  piperacillin -tazobactam (ZOSYN ) IVPB 3.375 g (has  no administration in time range)  vancomycin  (VANCOREADY) IVPB 750 mg/150 mL (has no administration in time range)  piperacillin -tazobactam (ZOSYN ) IVPB 3.375 g (0 g Intravenous Stopped 06/20/24 1847)  midazolam  (VERSED ) injection 1 mg (1 mg Intravenous Given 06/20/24 1745)  vancomycin  (VANCOREADY) IVPB 1500 mg/300 mL (1,500 mg Intravenous New Bag/Given 06/20/24 1947)  lactated ringers  bolus 1,000 mL (1,000 mLs Intravenous New Bag/Given 06/20/24 1944)                                    Medical Decision Making Amount and/or Complexity of Data Reviewed Labs: ordered. Radiology: ordered.  Risk Prescription drug management. Decision regarding hospitalization.   58 yo F with a chief complaints of worsening abscess noted on CT.  Patient was recently hospitalized for a possible retropharyngeal abscess thought to not require surgery and had been on IV antibiotics and then transition to oral.  Was initially on Vanco and Zosyn  and then transition to Unasyn  and then Augmentin .  She reports that she thought she had been doing well.  Had repeat CT imaging done yesterday was called by ID clinic today with concern for worsening abscess and sent here for IV antibiotics and admission.  I discussed case with Dr. Lucas, CT surgery agrees with IV antibiotics and admission and CT surgery to  evaluate in the morning.  The patients results and plan were reviewed and discussed.   Any x-rays performed were independently reviewed by myself.   Differential diagnosis were considered with the presenting HPI.  Medications  enoxaparin  (LOVENOX ) injection 40 mg (has no administration in time range)  lactated ringers  infusion ( Intravenous New Bag/Given 06/20/24 2048)  potassium chloride  10 mEq in 100 mL IVPB (10 mEq Intravenous New Bag/Given 06/20/24 2152)  piperacillin -tazobactam (ZOSYN ) IVPB 3.375 g (has no administration in time range)  vancomycin  (VANCOREADY) IVPB 750 mg/150 mL (has no administration in time range)  piperacillin -tazobactam (ZOSYN ) IVPB 3.375 g (0 g Intravenous Stopped 06/20/24 1847)  midazolam  (VERSED ) injection 1 mg (1 mg Intravenous Given 06/20/24 1745)  vancomycin  (VANCOREADY) IVPB 1500 mg/300 mL (1,500 mg Intravenous New Bag/Given 06/20/24 1947)  lactated ringers  bolus 1,000 mL (1,000 mLs Intravenous New Bag/Given 06/20/24 1944)    Vitals:   06/20/24 1633 06/20/24 1706 06/20/24 1925 06/20/24 1943  BP:  129/89 121/77 118/82  Pulse:  (!) 107 (!) 108 (!) 103  Resp:  (!) 33 18 15  Temp:  99.8 F (37.7 C) 99.1 F (37.3 C) 99.6 F (37.6 C)  TempSrc:  Oral Oral Oral  SpO2:  100% 95% 97%  Weight: 69.9 kg     Height: 5' 9 (1.753 m)       Final diagnoses:  Mediastinal abscess (HCC)    Admission/ observation were discussed with the admitting physician, patient and/or family and they are comfortable with the plan.       Final diagnoses:  Mediastinal abscess [facility])    ED Discharge Orders     None          Emil Share, DO 06/20/24 2201

## 2024-06-20 NOTE — Assessment & Plan Note (Deleted)
 Known hx of Mediastinal abscess was treated with Augmentin  875-125 mg BID - AM CBC

## 2024-06-20 NOTE — Assessment & Plan Note (Addendum)
 Breast cancer s/p radiation therapy  Anemia of neoplastic disease Emphysema lung

## 2024-06-20 NOTE — Telephone Encounter (Signed)
 Patient's husband, Elspeth [facility]), called and says he has not been able to [facility] and it's ready to be picked up. Notified Elspeth that he should be able to get Rx now.   Rainn Zupko, BSN, RN

## 2024-06-20 NOTE — Telephone Encounter (Signed)
-----   Message from [place]ice [place] sent at 06/20/2024  2:41 PM EDT ----- Regarding: pts  para=esophageal abscess iS WORSE on antibiotics Her retropharyngeal abscess has resolved her paraesophageal 1 has worsened and she now has a loculated effusion.  Given this is progressed on antibiotics I think she needs to be hospitalized and  evaluated by cardiothoracic surgery unless someone has an appointment available in clinic she would likely have to go through the ER for admission.  ----- Message ----- From: Interface, Rad Results In Sent: 06/19/2024   3:34 PM EDT To: Jomarie LOISE Fleeta Kathie, MD

## 2024-06-20 NOTE — Progress Notes (Signed)
 Pharmacy Antibiotic Note  Sue [name] is a 58 y.o. female admitted on 06/20/2024 with ongoing medistinal abscess.  Pharmacy has been consulted for vanc dosing.   Pt with recent supraglottic, retropharyngeal, and small peritonsillar abscess who presented with ongoing issue. Recent course of Augmentin   Plan: Vanc 1.5g IV x1 in ED then 750mg  IV BID>>AUC 415, scr 0.8 F/u levels if needed  Height: 5' 9 (175.3 cm) Weight: 69.9 kg (154 lb) IBW/kg (Calculated) : 66.2  Temp (24hrs), Avg:99.1 F (37.3 C), Min:97.9 F (36.6 C), Max:99.8 F (37.7 C)  Recent Labs  Lab 06/20/24 1641 06/20/24 1647 06/20/24 1902  WBC 12.3*  --   --   CREATININE 0.52  --   --   LATICACIDVEN  --  2.1* 1.7    Estimated Creatinine Clearance: 81.1 mL/min (by C-G formula based on SCr of 0.52 mg/dL).    No Known Allergies  Antimicrobials this admission: 8/7 zosyn >> 8/7 vanc>>  Dose adjustments this admission:   Microbiology results: 8/7 blood>>   Sergio Batch, PharmD, BCIDP, AAHIVP, CPP Infectious Disease Pharmacist 06/20/2024 8:20 PM

## 2024-06-20 NOTE — Plan of Care (Signed)
 [facility] Interim Progress Note  S: Night time rounds. Patient doing well, denies SOB. She feels about the same as she did when she came in. 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   O: BP 118/82 (BP Location: Right Arm)   Pulse (!) 103   Temp 99.6 F (37.6 C) (Oral)   Resp 15   Ht 5' 9 (1.753 m)   Wt 69.9 kg   LMP 10/24/2018   SpO2 97%   BMI 22.74 kg/m   General: Chronically ill-appearing, no acute distress Cardio: RRR, no murmur on exam Pulm: diffusely decreased breath sounds, No increased work of breathing Abdomen: Soft, bowel sounds present, nontender Extremity: No peripheral edema   A/P: Mediastinal Abscess  SIRS Criteria met  Clinically stable. On RA. No fever with elevated WBC.  - reordered vanc and Zosyn   - monitor respiratory status and fever curve overnight  - if clinically worsens or has new O2 requirement will consult PCCM  - CT surgery to see in the AM, appreciate recommendations  - sputum cultures ordered - urine legionella ordered   Sue Perkins, DO 06/20/2024, 8:33 PM PGY-3, [facility] pager [phone]

## 2024-06-20 NOTE — ED Notes (Signed)
  Zosyn  started prior to having blood cultures completed, due to difficult stick. IV team order placed.     Last imported Vital Signs BP 129/89   Pulse (!) 107   Temp 99.8 F (37.7 C) (Oral)   Resp (!) 33   Ht 5' 9 (1.753 m)   Wt 154 lb (69.9 kg)   LMP 10/24/2018   SpO2 100%   BMI 22.74 kg/m   Trending CBC Recent Labs    06/20/24 1641  WBC 12.3*  HGB 11.0*  HCT 33.6*  PLT 613*    Trending Coag's No results for input(s): APTT, INR in the last 72 hours.  Trending BMET Recent Labs    06/20/24 1641  NA 128*  K 3.2*  CL 91*  CO2 22  BUN 6  CREATININE 0.52  GLUCOSE 115*      Sue [name] M [name]  Trauma Response RN  Please call TRN at [phone] for further assistance.

## 2024-06-20 NOTE — ED Triage Notes (Signed)
 Pt to ED c/o emesis from doctors office  per chart review imaging shows paraesophageal abscess is worse on antibiotics Pt in obvious distress in triage.

## 2024-06-20 NOTE — Assessment & Plan Note (Addendum)
-   Admit to [facility]MTS, attending Dr. McDiarmid - CT surgery following, appreciate recs - 1L LR bolus and 125ml/h LR IVF - Zosyn , Vancomycin  - Sputum culture - Legionella - Trend lactic acid - Vital signs per floor - NPO diet  - VTE prophylaxis : Lovenox  - Blood Culture collected after antibiotic administration due to difficult stick - Fall precautions - Delirium precautions  - am BMP, CBC

## 2024-06-20 NOTE — Assessment & Plan Note (Signed)
-   IV Potassium 40meq - am BMP

## 2024-06-20 NOTE — ED Notes (Signed)
 Lactic acid result given to Sue [name].rn by at

## 2024-06-20 NOTE — Telephone Encounter (Signed)
Yes. My mistake.

## 2024-06-20 NOTE — Progress Notes (Signed)
 Note I was the person that recommended this patient come to the ER for admission and evaluation by car thoracic surgery.  While her retropharyngeal abscess has resolved with protracted (over a month of appropriate antibiotics) her mediastinal  abscess has not and there are multiple new disconcerting findings on CT scan. This along with our clinical condition is clearly to me an indication for need for surgical intervention. She was certainly covered with a proper antibiotics from an oral pharngeal source as evidenced also by her response in the retro pharyngeal Space. Changing antibiotics ALONE is like "re arranging deck chairs" she has missed work for more than a month and proven she needs surgery. While zosyn  is appropriate (though likely unnecessarily broad in Itself) vancomycin  has no role here. I am dc'ing it. Our team will formally consult in the morning

## 2024-06-20 NOTE — H&P (Cosign Needed Addendum)
 Hospital Admission History and Physical Service Pager: [phone]  Patient name: Sue [name] Medical record number: [id number] Date of Birth: 05/05/1966 Age: 58 y.o. Gender: female  Primary Care Provider: Rudy Carlin LABOR, MD Consultants: CT surgery  Code Status: Full Code  Preferred Emergency Contact:  Contact Information     Name Relation Home Work Mobile   [place] Spouse   [phone]      Other Contacts   None on File    Chief Complaint: worsening abscess   Differential and Medical Decision Making Sue [name] is a 58 y.o. female admitted for worsening abscess.  She was admitted a month ago with a supraglottic, retropharyngeal, and small peritonsillar abscess, treated with IV antibiotics and followed up with infectious disease this week to have a repeat CT scan.  This scan showed multiseptated paraesophageal fluid collection concerning for mediastinal abscess with consolidations in right middle lobe and right lower lobe of lung, with smaller loculated fluid collections medial to right middle lower lobe bases. ED provider consulted cardiothoracic surgery who recommended admission with IV antibiotics and they will eluate in the morning. She presented with lactic acidosis at 2.1, leukocytosis, tachypnea and tachycardia. Assessment & Plan Mediastinal abscess with possible sepsis - Admit to FMTS, attending Dr. McDiarmid - CT surgery following, appreciate recs - 1L LR bolus and 125ml/h LR IVF - Zosyn , Vancomycin  - Sputum culture - Legionella - Trend lactic acid - Vital signs per floor - NPO diet  - VTE prophylaxis : Lovenox  - Blood Culture collected after antibiotic administration due to difficult stick - Fall precautions - Delirium precautions  - am BMP, CBC Hypokalemia - IV Potassium 40meq - am BMP Chronic health problem Breast cancer s/p radiation therapy  Anemia of neoplastic disease Emphysema lung  FEN/GI: NPO except meds VTE Prophylaxis:  Lovenox   Disposition: Progressive   History of Present Illness:  Sue [name] is a 58 y.o. female presenting at [facility] ED after she was called regarding her CT Chest scan result concerning for worsening mediastinal abscess. Patient was recently hospitalized on  7/10-7/10/2024 for pericarditis related to supraglottic and retropharyngeal abscesses, mediastinitis which was treated with IV antibiotic. ID was consulted and follow during the admission. Patients state she barely able to take things by mouth since she was discharged. She states that she has been hungry but unable to eat anything because her food is tasting bad.  Also reports frequent sweating and chills.  In the ED, Patient hemodynamically stable and non-toxic.Pertinent labs including WBC of 12.3 with Lactic acid 2.1, Potassium 3.2 Chest radiograph shows stable right basilar consolidation and complex multilocular bilateral pleural fluid collections, compatible with known history of mediastinal/pulmonary abscess as seen on recent CT.  Review Of Systems: Per HPI with the following additions:  Review of Systems  Constitutional:  Positive for chills and fever.  Respiratory: Negative.    Cardiovascular: Negative.   Gastrointestinal:  Positive for nausea and vomiting.   Pertinent Past Medical History: Mediastinal abscess Breast cancer s/p radiation Pericarditis  Pertinent Past Surgical History: Achilles tendon surgery Breast lumpectomy Calcaneal osteotomy Remainder reviewed in history tab.   Pertinent Social History: Tobacco use: Former, quit 1995, used to smoke 1 pack per week for about 10 years Alcohol use: Drinks 6-pack per week Other Substance use: None Lives with husband and daughter  Pertinent Family History: Mother - breast cancer Paternal cousin - breast cancer Paternal aunt - brain cancer Remainder reviewed in history tab.   Remainder reviewed in history tab.  Important Outpatient Medications: Tylenol   PRN AUGMENTIN  875-125 mg BID Colchicine  0.5 mg BID Megestrol  40 mg BID Oxycodone  5 mg Q4H PRN Remainder reviewed in medication history.   Objective: BP 129/89   Pulse (!) 107   Temp 99.8 F (37.7 C) (Oral)   Resp (!) 33   Ht 5' 9 (1.753 m)   Wt 69.9 kg   LMP 10/24/2018   SpO2 100%   BMI 22.74 kg/m  Physical Exam Constitutional:      Appearance: Normal appearance.  Cardiovascular:     Rate and Rhythm: Normal rate and regular rhythm.     Pulses: Normal pulses.     Heart sounds: Normal heart sounds.  Pulmonary:     Effort: Pulmonary effort is normal.     Breath sounds: Normal breath sounds.  Abdominal:     Palpations: Abdomen is soft.  Skin:    Capillary Refill: Capillary refill takes less than 2 seconds.  Neurological:     General: No focal deficit present.     Mental Status: She is alert and oriented to person, place, and time.  Psychiatric:        Mood and Affect: Mood normal.     Labs:  CBC BMET  Recent Labs  Lab 06/20/24 1641  WBC 12.3*  HGB 11.0*  HCT 33.6*  PLT 613*   Recent Labs  Lab 06/20/24 1641  NA 128*  K 3.2*  CL 91*  CO2 22  BUN 6  CREATININE 0.52  GLUCOSE 115*  CALCIUM 8.9         Imaging Studies Performed: EXAM:  CT NECK WITH CONTRAST  06/17/2024 02:18:14 PM  IMPRESSION: 1. Multi-septate para-esophageal fluid collection measuring approximately 4 x 3 x 8 cm, which remains concerning for mediastinal abscess. 2. New wedge-shaped area of consolidation within the medial segment of the right middle lobe and new consolidation centrally within the base of the right lower lobe. 3. Probable compressive atelectasis in the right lower lobe adjacent to a loculated pleural fluid collection measuring approximately 12 x 5 x 6 cm. 4. Smaller loculated fluid collections medial to the bases of the right middle and lower lobes. 5. Probable loculated fluid collection in the base of the left pleural cavity measuring approximately 9 x 3 x 6.5  cm.   EXAM: CT NECK WITH CONTRAST 06/17/2024 02:18:14 PM  IMPRESSION: 1. Resolution of right supraglottic and retropharyngeal abscess noted on the previous study. 2. No residual inflammatory changes evident within the neck. 3. Dystrophic calcifications within the left palatine tonsils. 4. Few residual shotty lymph nodes, but no abnormally enlarged or morphologically suspicious nodes.  Houston Samuels, DO 06/20/2024, 7:15 PM PGY-1, [facility]  FPTS Intern pager: [phone], text pages welcome Secure chat group [facility] Teaching Service   Upper Level Addendum: I have seen and evaluated this patient along with Dr. Samuels and reviewed the above note, making necessary revisions as appropriate. I agree with the medical decision making and physical exam as noted above. Elyce Prescott, DO PGY-2 [facility] Res[facility]

## 2024-06-20 NOTE — Telephone Encounter (Signed)
 Called pts spouse regarding results. Understands to go to ED for evaluation since clinic does not have open appts.  No questions at this time. Lorenda CHRISTELLA Code, RMA

## 2024-06-21 ENCOUNTER — Inpatient Hospital Stay (HOSPITAL_COMMUNITY)

## 2024-06-21 ENCOUNTER — Encounter (HOSPITAL_COMMUNITY): Payer: Self-pay | Admitting: Family Medicine

## 2024-06-21 DIAGNOSIS — R634 Abnormal weight loss: Secondary | ICD-10-CM | POA: Diagnosis present

## 2024-06-21 DIAGNOSIS — J90 Pleural effusion, not elsewhere classified: Secondary | ICD-10-CM

## 2024-06-21 DIAGNOSIS — E876 Hypokalemia: Secondary | ICD-10-CM

## 2024-06-21 DIAGNOSIS — E46 Unspecified protein-calorie malnutrition: Secondary | ICD-10-CM | POA: Diagnosis present

## 2024-06-21 DIAGNOSIS — J391 Other abscess of pharynx: Secondary | ICD-10-CM

## 2024-06-21 DIAGNOSIS — J181 Lobar pneumonia, unspecified organism: Secondary | ICD-10-CM

## 2024-06-21 DIAGNOSIS — F419 Anxiety disorder, unspecified: Secondary | ICD-10-CM

## 2024-06-21 DIAGNOSIS — J387 Other diseases of larynx: Secondary | ICD-10-CM

## 2024-06-21 DIAGNOSIS — F32A Depression, unspecified: Secondary | ICD-10-CM

## 2024-06-21 DIAGNOSIS — J853 Abscess of mediastinum: Secondary | ICD-10-CM

## 2024-06-21 DIAGNOSIS — F322 Major depressive disorder, single episode, severe without psychotic features: Secondary | ICD-10-CM

## 2024-06-21 DIAGNOSIS — E43 Unspecified severe protein-calorie malnutrition: Secondary | ICD-10-CM | POA: Insufficient documentation

## 2024-06-21 DIAGNOSIS — E871 Hypo-osmolality and hyponatremia: Secondary | ICD-10-CM | POA: Diagnosis present

## 2024-06-21 LAB — BASIC METABOLIC PANEL WITH GFR
Anion gap: 10 (ref 5–15)
Anion gap: 11 (ref 5–15)
BUN: <5 mg/dL — ABNORMAL LOW (ref 6–20)
BUN: <5 mg/dL — ABNORMAL LOW (ref 6–20)
CO2: 26 mmol/L (ref 22–32)
CO2: 24 mmol/L (ref 22–32)
Calcium: 8.4 mg/dL — ABNORMAL LOW (ref 8.9–10.3)
Calcium: 8.5 mg/dL — ABNORMAL LOW (ref 8.9–10.3)
Chloride: 96 mmol/L — ABNORMAL LOW (ref 98–111)
Chloride: 99 mmol/L (ref 98–111)
Creatinine, Ser: 0.54 mg/dL (ref 0.44–1.00)
Creatinine, Ser: 0.49 mg/dL (ref 0.44–1.00)
GFR, Estimated: >60 mL/min (ref >60)
GFR, Estimated: >60 mL/min (ref >60)
Glucose, Bld: 96 mg/dL (ref 70–99)
Glucose, Bld: 94 mg/dL (ref 70–99)
Potassium: 2.9 mmol/L — ABNORMAL LOW (ref 3.5–5.1)
Potassium: 3.7 mmol/L (ref 3.5–5.1)
Sodium: 132 mmol/L — ABNORMAL LOW (ref 135–145)
Sodium: 134 mmol/L — ABNORMAL LOW (ref 135–145)

## 2024-06-21 LAB — CBC WITH DIFFERENTIAL/PLATELET
Abs Immature Granulocytes: 0.1 K/uL — ABNORMAL HIGH (ref 0.00–0.07)
Basophils Absolute: 0 K/uL (ref 0.0–0.1)
Basophils Relative: 0 %
Eosinophils Absolute: 0 K/uL (ref 0.0–0.5)
Eosinophils Relative: 0 %
HCT: 26.4 % — ABNORMAL LOW (ref 36.0–46.0)
Hemoglobin: 8.9 g/dL — ABNORMAL LOW (ref 12.0–15.0)
Immature Granulocytes: 1 %
Lymphocytes Relative: 10 %
Lymphs Abs: 1.1 K/uL (ref 0.7–4.0)
MCH: 32 pg (ref 26.0–34.0)
MCHC: 33.7 g/dL (ref 30.0–36.0)
MCV: 95 fL (ref 80.0–100.0)
Monocytes Absolute: 1.4 K/uL — ABNORMAL HIGH (ref 0.1–1.0)
Monocytes Relative: 12 %
Neutro Abs: 9.3 K/uL — ABNORMAL HIGH (ref 1.7–7.7)
Neutrophils Relative %: 77 %
Platelets: 536 K/uL — ABNORMAL HIGH (ref 150–400)
RBC: 2.78 MIL/uL — ABNORMAL LOW (ref 3.87–5.11)
RDW: 13.7 % (ref 11.5–15.5)
WBC: 12 K/uL — ABNORMAL HIGH (ref 4.0–10.5)
nRBC: 0 % (ref 0.0–0.2)

## 2024-06-21 LAB — EXPECTORATED SPUTUM ASSESSMENT W GRAM STAIN, RFLX TO RESP C

## 2024-06-21 LAB — MAGNESIUM: Magnesium: 1.9 mg/dL (ref 1.7–2.4)

## 2024-06-21 MED ORDER — PANTOPRAZOLE SODIUM 40 MG IV SOLR
40.0000 mg | INTRAVENOUS | Status: DC
  Administered 2024-06-21 – 2024-06-25 (×7): 40 mg via INTRAVENOUS
  Filled 2024-06-21 (×5): qty 10

## 2024-06-21 MED ORDER — POTASSIUM CHLORIDE 10 MEQ/100ML IV SOLN
10.0000 meq | INTRAVENOUS | Status: AC
  Administered 2024-06-21 (×4): 10 meq via INTRAVENOUS
  Filled 2024-06-21 (×3): qty 100

## 2024-06-21 MED ORDER — IOHEXOL 300 MG/ML  SOLN
50.0000 mL | Freq: Once | INTRAMUSCULAR | Status: AC | PRN
  Administered 2024-06-21: 50 mL via ORAL

## 2024-06-21 MED ORDER — ACETAMINOPHEN 10 MG/ML IV SOLN: 1000.0000 mg | Freq: Three times a day (TID) | INTRAVENOUS | Status: DC | PRN

## 2024-06-21 MED ORDER — MAGNESIUM SULFATE 2 GM/50ML IV SOLN
2.0000 g | Freq: Once | INTRAVENOUS | Status: AC
  Administered 2024-06-21: 2 g via INTRAVENOUS
  Filled 2024-06-21: qty 50

## 2024-06-21 MED ORDER — ONDANSETRON HCL 4 MG/2ML IJ SOLN: 4.0000 mg | Freq: Three times a day (TID) | INTRAMUSCULAR | Status: DC | PRN

## 2024-06-21 MED ORDER — POTASSIUM CHLORIDE 10 MEQ/100ML IV SOLN: 10.0000 meq | INTRAVENOUS | Status: DC

## 2024-06-21 NOTE — Assessment & Plan Note (Signed)
 Breast cancer s/p radiation therapy  Anemia of neoplastic disease Emphysema lung

## 2024-06-21 NOTE — Assessment & Plan Note (Addendum)
-   RD consult - Monitoring for refeeding syndrome  - Daily Phos, BMP, Mag - Encourage PO intake as tolerated  - replete K with 40 mEq IV and 40 mEq PO

## 2024-06-21 NOTE — Hospital Course (Addendum)
 Sue [name] is a 58 y.o.female with a history of Breast cancer s/p radiation, Pericarditis, and Mediastinal abscess admitted to the [facility] Medicine Teaching Service at [facility] after a repeat CT Chest scan result concerning for worsening mediastinal abscess on 06/17/2024.  Her hospital course is detailed below:  Mediastinal abscess with possible sepsis Initial CT concerning for mediastinal abscess with pleural fluid collection. She was initially placed on IV Zosyn  (8/7) then switched to IV Ertapenem  1 g daily by ID with Pleurevac drain placement by IR on 8/11 with exudative output. Pleurevac chest tube removed by IR on 08/14 and patient medically stable for discharge at that time on 3 weeks of ertapenem  through PICC line (to end on 07/15/24).  Malnutrition with unintended weight loss Patient with unintended weight loss secondary to loss of appetite and change of taste. She was supplementing only with cow's milk and alternative milk to get some nutrients in. With treatment of underlying condition, patient began to have increased appetite and began to eat 50%+ of her meals throughout the hospital course.   Other chronic conditions were medically managed with home medications and formulary alternatives as necessary.  PCP Follow-up Recommendations 1. Continue to follow with her poor PO intake 2. Follow up on anemia, consider repeat CBC 3. Follow up on PICC line hygiene and home abx administration 5. Follow up with ID

## 2024-06-21 NOTE — Consult Note (Addendum)
 [address]       [place] [address]             [phone]        ERISHA PAUGH [facility] Medical Record #[id number] Date of Birth: 1965/12/02  Referring: No ref. provider found Primary Care: Rudy Carlin LABOR, MD Primary Cardiologist:Gayatri LABOR Merck, MD  Chief Complaint:    Chief Complaint  Patient presents with   Emesis    History of Present Illness:     Sue [name] is a 58 year old female with a past medical history of malnutrition with unintended weight loss, left breast cancer s/p radiation therapy and lumpectomy treated with Tamoxifen , hypokalemia, hyponatremia, supraglottic abscess, and loculated pleural effusion. The patient was admitted to the hospital on 07/09 with a sore throat and difficulty swallowing that did not improve after Amoxicillin  given by the urgent care diagnosed with otitis externa. CT at that time showed supraglottic, retropharyngeal abscesses, peritonsillar abscesses and mediastinitis. She was also diagnosed with pericarditis at this time and followed by cardiology. Echocardiogram showed LVEF 55-60%, small pericardial effusion, mild mitral valve regurgitation, and borderline dilatation of the ascending aorta measuring 39mm. She was treated with colchicine  and ibuprofen . Dr. Lucas was called and notified and he advised that the source of the abscess is in the pharynx or esophagus with spread into the mediastinum but there was no fluid collection that was amenable to CT surgery intervention, he recommended antibiotic therapy and ENT management of any head and neck abscesses with drainage if indicated. ENT did not feel I&D was necessary and her symptoms continued to improve with IV antibiotics including Vancomycin  and Zosyn  which was transitioned to Unasyn  with transition to PO Augmentin  for 2 months at discharge as well as IV dexamethasone  07/10-07/11. She was also worked up for [employer]. She was discharged on 07/12 with ID and cardiology  follow up appointments. She underwent follow up CT neck and chest on 08/04 which showed resolution of the right supraglottic and retropharyngeal abscess with no residual inflammatory changes within the neck but multiseptate paraesophageal fluid collection increased in size measuring 4cmx3cmx8cm concerning for mediastinal abscess, new consolidation within the medial segment of the right middle lobe, new consolidation within the base of the right lower lobe, compressive atelectasis in the right lower lobe adjacent to a loculated pleural fluid collection measuring 12cmx5cmx6cm, smaller loculated fluid collections medial to the bases of the right middle and lower lobes and probable loculated fluid collection in the base of the left pleural cavity measuring 9cmx3cmx6.5cm. She was called with the results and was told to present to the ED by infectious disease. Although she felt her symptoms were improving but later admits she has barely ben able to take things by mouth because everything tastes bad. She reports she has been coughing up green sputum and has had consistent diarrhea due to colchicine . She has lost 10-15lbs in the last month. She reports fatigue, fevers, chills, palpitations and weakness. She also admits to nausea and 1 episode of vomiting. She denies shortness of breath, dizziness, LOC and chest pain. Her case was discussed with Dr. Lucas last night and he agreed with starting IV antibiotics and admitting this patient. She was also thought to have possible sepsis with lactic acid 2.1, blood cultures were taken following antibiotic administration as well as urine legionella, and sputum culture which are pending. She was started on IV Zosyn  and IV Vancomycin , Vancomycin  was later discontinued by infectious disease.  Upon arrival the patient seems to be very depressed and reports she does not want us  opening her chest. She reports she is hungry but every time she goes to eat something it tastes horrible.  She is currently NPO. She lives with her husband and she has not been able to work for the past month.    Current Activity/ Functional Status: Patient is independent with mobility/ambulation, transfers, ADL's, IADL's.   Zubrod Score: At the time of surgery this patient's most appropriate activity status/level should be described as: []     0    Normal activity, no symptoms []     1    Restricted in physical strenuous activity but ambulatory, able to do out light work [x]     2    Ambulatory and capable of self care, unable to do work activities, up and about                 more than 50%  Of the time                            []     3    Only limited self care, in bed greater than 50% of waking hours []     4    Completely disabled, no self care, confined to bed or chair []     5    Moribund  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   Personal history of radiation therapy    Retropharyngeal abscess 05/22/2024   Right foot pain    S/P radiation therapy 12/06/12 -01/23/13   Left Breast/Axilla / 46 Gy / 23 Fractions with a Boost to Left Breast / 14 Gy / 7 Fractions   Use of tamoxifen  (Nolvadex ) 01/2013   Wears glasses     Past Surgical History:  Procedure Laterality Date   ACHILLES TENDON SURGERY Right 06/02/2020   Procedure: ACHILLES LENGTHENING/KIDNER;  Surgeon: Elsa Lonni SAUNDERS, MD;  Location: [facility];  Service: Orthopedics;  Laterality: Right;   BREAST LUMPECTOMY Left    takes tamoxifen    CALCANEAL OSTEOTOMY Right 06/02/2020   Procedure: RIGHT LATERAL DISPLACEMENT CALCANEAL  OSTEOTOMY, FLEXOR DIGITORUM LONGUS TRANSFER, SPRING LIGAMENT RECONSTRUCTION, POSTERIOR TIBIAL TENDON DEBRIDEMENT, DEEP ORTHOPEDIC HARDWARE REMOVAL, MEDIAL CUNEIFORM PLANTAR FLEXION OSTEOTOMY AND ACHILLES LENGTHENING, PARTIAL RESECTION OF NAVICULAR;  Surgeon: Elsa Lonni SAUNDERS, MD;  Location: [facility];  Service: Orthopedics;  Latera   DILATION AND CURETTAGE OF UTERUS     Following Miscarriage   FOOT FUSION  3/09   ankle rt   HARDWARE REMOVAL Right 06/02/2020   Procedure: HARDWARE REMOVAL;  Surgeon: Elsa Lonni SAUNDERS, MD;  Location: [facility];  Service: Orthopedics;  Laterality: Right;   Left Breast Lumpectomy  08/28/12   Left Breast Needle Core Biopsy  07/26/12   UOQ - Ductal Carcinoma In Situ with Necrosis. Microcalcifications Identified   METATARSAL OSTEOTOMY Right 06/02/2020   Procedure: METATARSAL OSTEOTOMY;  Surgeon: Elsa Lonni SAUNDERS, MD;  Location: [facility];  Service: Orthopedics;  Laterality: Right;   RE-EXCISION OF BREAST  CANCER,SUPERIOR MARGINS  10/30/2012   Procedure: RE-EXCISION OF BREAST CANCER,SUPERIOR MARGINS;  Surgeon: Elon CHRISTELLA Pacini, MD;  Location: [facility];  Service: General;  Laterality: N/A;  left partial mastectomy with excision of margins   re-excision of left breast cancer on 09/10/12      Social History   Tobacco Use  Smoking Status Former   Current packs/day: 0.00   Types: Cigarettes   Quit date: 11/30/1993   Years since quitting: 30.5  Smokeless Tobacco Never    Social History   Substance and Sexual Activity  Alcohol Use Not Currently   Comment: 1x per week     No Known Allergies  Current Facility-Administered Medications  Medication Dose Route Frequency Provider Last Rate Last Admin   acetaminophen  (OFIRMEV ) IV 1,000 mg  1,000 mg Intravenous Q8H PRN Suknaim, Kulkaew B, DO       enoxaparin  (LOVENOX ) injection 40 mg  40 mg Subcutaneous Q24H Everhart, Kirstie, DO       lactated ringers  infusion    Intravenous Continuous Everhart, Kirstie, DO 125 mL/hr at 06/21/24 0620 Restarted at 06/21/24 9379   ondansetron  (ZOFRAN ) injection 4 mg  4 mg Intravenous Q8H PRN Suknaim, Kulkaew B, DO       pantoprazole  (PROTONIX ) injection 40 mg  40 mg Intravenous Q24H Suknaim, Kulkaew B, DO   40 mg at 06/21/24 1015   piperacillin -tazobactam (ZOSYN ) IVPB 3.375 g  3.375 g Intravenous Q8H Cleotilde Perkins, DO 12.5 mL/hr at 06/21/24 0948 3.375 g at 06/21/24 0948    Medications Prior to Admission  Medication Sig Dispense Refill Last Dose/Taking   acetaminophen  (TYLENOL ) 500 MG tablet Take 1,500 mg by mouth as needed for mild pain (pain score 1-3) or moderate pain (pain score 4-6).   Past Week   amoxicillin -clavulanate (AUGMENTIN ) 875-125 MG tablet Take 1 tablet by mouth 2 (two) times daily. 60 tablet 4 06/20/2024 Morning   colchicine  0.6 MG tablet Take 1 tablet (0.6 mg total) by mouth 2 (two) times daily. (Patient not taking: Reported on 06/20/2024) 60 tablet 2 Not Taking   ibuprofen  (ADVIL ) 800 MG tablet Take 1 tablet (800 mg total) by mouth every 8 (eight) hours as needed (pain). Take with food to avoid stomach upset. Do not take any additional NSAIDs while on this. You may take tylenol  in addition to this if needed for extra pain relief. (Patient not taking: Reported on 06/20/2024) 21 tablet 0 Not Taking   megestrol  (MEGACE ) 40 MG tablet Take 1 tablet (40 mg total) by mouth 2 (two) times daily. (Patient not taking: Reported on 06/20/2024) 60 tablet 0 Not Taking   oxyCODONE  (OXY IR/ROXICODONE ) 5 MG immediate release tablet Take 0.5 tablets (2.5 mg total) by mouth every 4 (four) hours as needed for up to 10 doses for severe pain (pain score 7-10). (Patient not taking: No sig reported) 5 tablet 0 Not Taking    Family History  Problem Relation Age of Onset   Breast cancer Mother 19       blood cancer too   Brain cancer Paternal Aunt        diagnosed in late 16s to early 78s   Breast cancer Cousin        paternal cousin  diagnosed; diagnosed in her late 84s   Colon cancer Neg Hx    Colon polyps Neg Hx    Esophageal cancer Neg Hx    Rectal cancer Neg Hx    Stomach cancer Neg Hx    Review of Systems:  Review of Systems  Constitutional:  Positive for chills, diaphoresis, fever, malaise/fatigue and weight loss.  HENT:  Positive for sore throat. Negative for congestion, hearing loss and nosebleeds.   Eyes:  Negative for blurred vision.  Respiratory:  Positive for cough and sputum production. Negative for hemoptysis, shortness of breath, wheezing and stridor.        Green sputum  Cardiovascular:  Positive for palpitations. Negative for chest pain, orthopnea and leg swelling.  Gastrointestinal:  Positive for diarrhea, nausea and vomiting. Negative for abdominal pain and constipation.  Genitourinary:  Negative for dysuria.  Musculoskeletal:  Negative for myalgias and neck pain.  Skin:  Negative for rash.  Neurological:  Positive for weakness. Negative for dizziness and loss of consciousness.  Endo/Heme/Allergies:  Does not bruise/bleed easily.  Psychiatric/Behavioral:  Positive for depression. Negative for suicidal ideas. The patient is nervous/anxious.   Saw the dentist in April and had teeth removed, reports she did not get any infection following this  Physical Exam: BP 104/71   Pulse 85   Temp 98.4 F (36.9 C)   Resp 20   Ht 5' 9 (1.753 m)   Wt 69.9 kg   LMP 10/24/2018   SpO2 95%   BMI 22.74 kg/m   General appearance: alert, cooperative, no distress, and depressed affect Head: Normocephalic, without obvious abnormality, atraumatic Neck: no adenopathy, no carotid bruit, no JVD, supple, symmetrical, trachea midline, and thyroid not enlarged, symmetric, no tenderness/mass/nodules Lymph nodes: Cervical, supraclavicular, and axillary nodes normal. Resp: diminished bibasilar breath sounds Cardio: regular rate and rhythm, S1, S2 normal, no murmur, click, rub or gallop GI: soft, non-tender; bowel  sounds normal; no masses,  no organomegaly Extremities: extremities normal, atraumatic, no cyanosis or edema Neurologic: Grossly normal  Diagnostic Studies & Radiology Findings:  EXAM: CT CHEST WITH CONTRAST 06/17/2024 02:18:14 PM   TECHNIQUE: CT of the chest was performed with the administration of intravenous contrast (75mL iohexol  (OMNIPAQUE ) 300 MG/ML solution). Multiplanar reformatted images are provided for review. Automated exposure control, iterative reconstruction, and/or weight based adjustment of the mA/kV was utilized to reduce the radiation dose to as low as reasonably achievable.   COMPARISON: CT of the chest dated 05/22/2024.   CLINICAL HISTORY: Lung/mediastinal abscess. Mediastinal abscess; Retropharyngeal abscess; Pericarditis, fatigue; Neck mass, nonpulsatile; Lung/mediastinal abscess; Weight loss, 10 lbs; No difficulty swallowing or breathing; Not tender; SHOB, difficulty moving.   FINDINGS:   MEDIASTINUM: Heart and pericardium are unremarkable. The central airways are clear. There is a multi-septate paraesophageal fluid collection which measures approximately 4 x 3 x 8 cm.   LYMPH NODES: No mediastinal, hilar or axillary lymphadenopathy.   LUNGS AND PLEURA: There is mild-to-moderate paraseptal emphysema within the lung apices bilaterally. There is irregular reticulation present anterolaterally within the left upper lobe. There is a new wedge-shaped area of consolidation within the medial segment of the right middle lobe. There is also new consolidation present centrally within the base of the right lower lobe. There is probable compressive atelectasis present posterolaterally within the right lower lobe adjacent to a loculated pleural fluid collection which measures approximately 12 x 5 x 6 cm. There are smaller loculated fluid collections medial to the bases of the right middle and lower lobes. There is also probable loculated fluid collection present  dependently in the base of the left pleural cavity which measures approximately 9 x 3 x 6.5 cm.   SOFT TISSUES/BONES: There is mild-to-moderate dextroscoliosis of the thoracic spine. There is no evidence of osteomyelitis.   UPPER  ABDOMEN: Limited images of the upper abdomen demonstrates no acute abnormality.   IMPRESSION: 1. Multi-septate para-esophageal fluid collection measuring approximately 4 x 3 x 8 cm, which remains concerning for mediastinal abscess. . 2. New wedge-shaped area of consolidation within the medial segment of the right middle lobe and new consolidation centrally within the base of the right lower lobe. 3. Probable compressive atelectasis in the right lower lobe adjacent to a loculated pleural fluid collection measuring approximately 12 x 5 x 6 cm. 4. Smaller loculated fluid collections medial to the bases of the right middle and lower lobes. 5. Probable loculated fluid collection in the base of the left pleural cavity measuring approximately 9 x 3 x 6.5 cm.   Electronically signed by: evalene coho 06/19/2024 03:33 PM EDT RP Workstation: [id number]  EXAM: CT NECK WITH CONTRAST 06/17/2024 02:18:14 PM   TECHNIQUE: CT of the neck was performed with the administration of intravenous contrast. Multiplanar reformatted images are provided for review. Automated exposure control, iterative reconstruction, and/or weight based adjustment of the mA/kV was utilized to reduce the radiation dose to as low as reasonably achievable.   COMPARISON: CT of the neck dated 05/22/2024.   CLINICAL HISTORY: Neck mass, nonpulsatile. Mediastinal abscess; Retropharyngeal abscess; Pericarditis, fatigue; Neck mass, nonpulsatile; Lung/mediastinal abscess; Dx'd 05/20/2024; Weight loss, 10 lbs; No difficulty swallowing or breathing; Not tender; SHOB, difficulty moving.   FINDINGS:   AERODIGESTIVE TRACT: The right supraglottic and retropharyngeal abscess noted on the previous  study has resolved in the interim. There are dystrophic calcifications again demonstrated within the left palatine tonsils.   SALIVARY GLANDS: The parotid and submandibular glands are unremarkable.   THYROID: Unremarkable.   LYMPH NODES: There are few residual shotty lymph nodes but no abnormally enlarged or morphologically suspicious nodes.   SOFT TISSUES: No residual inflammatory changes evident within the neck.   BRAIN, ORBITS, SINUSES AND MASTOIDS: No acute abnormality.   LUNGS AND MEDIASTINUM: No acute abnormality.   BONES: No focal bone abnormality.   IMPRESSION: 1. Resolution of right supraglottic and retropharyngeal abscess noted on the previous study. 2. No residual inflammatory changes evident within the neck. 3. Dystrophic calcifications within the left palatine tonsils. 4. Few residual shotty lymph nodes, but no abnormally enlarged or morphologically suspicious nodes.   Electronically signed by: evalene coho 06/19/2024 03:21 PM EDT RP Workstation: [id number]  Assessment & Plan: Paraesophageal abscess with resolved right supraglottic and retropharyngeal abscesses: On IV zosyn  per ID. Received IV abx last month which were transitioned to PO Augmentin  when discharged. Paraesophageal fluid collection has increased in size over the last month. Continue IV antibiotics per ID and will get an esophagram while patient is NPO. Dr. Shyrl to determine if cardiothoracic surgery is indicated but patient adamantly does not want us  to go into her chest but reports she will accept a chest tube if needed.  Consolidation of the right middle and right lower lobe: Continue IV abx Loculated pleural fluid collections: Continue IV abx Severe anxiety and depression: No suicidal ideations, not on medication. Received Versed  yesterday Malnutrition with 10-15lbs weight loss over the last month: Dietician has been consulted Hyponatremia: Primary team is monitoring Hypokalemia:  Improved with supplementation  Con GORMAN Bend, PA-C 06/21/24 I spent approximately 30 minutes with this patient   Agree with above 58yo female with loculated right pleural effusion, and mediastinal fluid collection.  Likely secondary to previous upper pharyngeal abscess.  There is a dominant pleural fluid collection that appears amenable to CT guided drain placement.  In regards to the mediastinal collection, there is no evidence of leak on the esophagram.  This will be very challenging to address surgically without injury to the esophagus or aorta.  Would recommend continued medical therapy and nutritional optimization for now.  Robie Mcniel MALVA Rayas

## 2024-06-21 NOTE — Assessment & Plan Note (Signed)
-   IV Potassium 40meq  - Daily BMP

## 2024-06-21 NOTE — Assessment & Plan Note (Addendum)
 Improving with fluids. No medical intervention needed at this time - Daily BMP

## 2024-06-21 NOTE — Assessment & Plan Note (Addendum)
 Clinically stable. Mild improvement in leukocytosis, afebrile overnight ***. Dc'd airborne precautions per ID.  - CT surgery following  - Continue IVF LR at 125ml/h  - Day 3 IV Zosyn , may need prolonged course as unable to drain mediastinal abscess  - F/u Blood Culture collected after antibiotics - sputum cultures result pending  - urine legionella result pending  - Daily CBC

## 2024-06-21 NOTE — Assessment & Plan Note (Signed)
 sSodium 132 this morning. No medical intervention needed at this time Continue to monitor s/s - Daily BMP

## 2024-06-21 NOTE — Progress Notes (Signed)
     Daily Progress Note Intern Pager: [phone]  Patient name: Sue [name] Medical record number: [id number] Date of birth: [birth year] Age: 58 y.o. Gender: female  Primary Care Provider: Rudy Carlin LABOR, MD Consultants: CT surgery, Infectious Disease  Code Status: Full code   Pt Overview and Major Events to Date:  8/7: Admitted   Assessment and Plan:  Sue [name] is a 58 y.o. female admitted for failure to thrive meeting SIRS criteria secondary to mediastinal abscesses. Currently on IV Zosyn  awaiting CT guided drain placement.  Assessment & Plan Mediastinal abscess with possible sepsis Clinically stable. Mild improvement in leukocytosis, afebrile overnight. Dc'd airborne precautions per ID.  - CT surgery following  - Continue IVF LR at 125ml/h  - Day 3 IV Zosyn , may need prolonged course as unable to drain mediastinal abscess  - F/u Blood Culture collected after antibiotics - sputum cultures result pending  - urine legionella result pending  - Daily CBC  Malnutrition with unintended weight loss Protein-calorie malnutrition, severe Hypokalemia - RD consult - Monitoring for refeeding syndrome  - Daily Phos, BMP, Mag - Encourage PO intake as tolerated  - replete K with 40 mEq IV and 40 mEq PO Hyponatremia Improving with fluids. No medical intervention needed at this time - Daily BMP Chronic health problem Breast cancer s/p radiation therapy  Anemia of neoplastic disease Emphysema lung   FEN/GI: Regular, will change to NPO once CT surgery has been planned PPx: Lovenox   Dispo:Home pending clinical improvement . Barriers include IV antibiotics and planning for surgical intervention.   Subjective:  NAEO, resting comfortably.   Objective: Temp:  [98.2 F (36.8 C)-98.8 F (37.1 C)] 98.5 F (36.9 C) (08/08 1526) Pulse Rate:  [85-90] 89 (08/08 1526) Resp:  [20-21] 20 (08/08 1526) BP: (94-104)/(63-71) 102/68 (08/08 1526) SpO2:  [95 %-98 %] 98 % (08/08  1526) Physical Exam: General: Chronically ill-appearing, no acute distress Cardio: RRR, no murmur on exam Pulm: diminished breath sounds, No increased work of breathing Abdomen: Soft, bowel sounds present, nontender Extremity: No peripheral edema   Laboratory: Most recent CBC Lab Results  Component Value Date   WBC 12.0 (H) 06/21/2024   HGB 8.9 (L) 06/21/2024   HCT 26.4 (L) 06/21/2024   MCV 95.0 06/21/2024   PLT 536 (H) 06/21/2024   Most recent BMP    Latest Ref Rng & Units 06/21/2024   10:26 AM  BMP  Glucose 70 - 99 mg/dL 94   BUN 6 - 20 mg/dL <5   Creatinine 9.55 - 1.00 mg/dL 9.50   Sodium 864 - 854 mmol/L 134   Potassium 3.5 - 5.1 mmol/L 3.7   Chloride 98 - 111 mmol/L 99   CO2 22 - 32 mmol/L 24   Calcium 8.9 - 10.3 mg/dL 8.5    Imaging/Diagnostic Tests: No new imaging.   Pinal Perkins, DO 06/21/2024, 7:48 PM  PGY-3, [facility] FPTS Intern pager: [phone], text pages welcome Secure chat group [facility] Teaching Service

## 2024-06-21 NOTE — Plan of Care (Signed)
   Problem: Education: Goal: Knowledge of General Education information will improve Description: Including pain rating scale, medication(s)/side effects and non-pharmacologic comfort measures Outcome: Progressing   Problem: Clinical Measurements: Goal: Respiratory complications will improve Outcome: Progressing   Problem: Activity: Goal: Risk for activity intolerance will decrease Outcome: Progressing

## 2024-06-21 NOTE — Assessment & Plan Note (Signed)
 CTAB w/ contrast on 8/4 shows Multi-septate para-esophageal fluid collection measuring approximately 4 x 3 x 8 cm, which remains concerning for mediastinal abscess.  - Admit to FMTS, attending Dr. McDiarmid - CT surgery following - pending recommendation  - Continue IVF LR at 125ml/h  - Continue Zosyn , discontinue Vancomycin  per ID recommendation  - Vital signs per floor - NPO   - Blood Culture collected after antibiotic administration due to difficult stick - Airborne isolation for rule out TB  - am BMP, CBC - sputum cultures result pending  - urine legionella result pending  - VTE prophylaxis : Lovenox  - Fall precautions - Delirium precautions

## 2024-06-21 NOTE — Assessment & Plan Note (Signed)
-   Dietitian consult - Monitoring for possible refeeding syndrome  - Daily Phos

## 2024-06-21 NOTE — Progress Notes (Signed)
 Initial Nutrition Assessment  DOCUMENTATION CODES:   Severe malnutrition in context of acute illness/injury  INTERVENTION:  Once diet resumes, recommend: Regular diet Encourage husband to bring patients preferred protein powder Addition of probiotic Discuss with patient which nutrition supplement she would prefer for optimizing nutrition intake in the setting of malnutrition  NUTRITION DIAGNOSIS:   Severe Malnutrition related to acute illness (mediastinal abscess) as evidenced by energy intake < or equal to 50% for > or equal to 5 days, severe muscle depletion.  GOAL:   Patient will meet greater than or equal to 90% of their needs  MONITOR:   Diet advancement, Labs, Weight trends  REASON FOR ASSESSMENT:   Consult Assessment of nutrition requirement/status  ASSESSMENT:   Pt admitted with worsening mediastinal abscess. PMH significant for breast cancer s/p radiation, pericarditis.  CTAB w/ contrast on 8/4 shows Multi-septate para-esophageal fluid collection measuring approximately 4 x 3 x 8 cm, which remains concerning for mediastinal abscess.   Spoke with pt at bedside. She is frustrated with ongoing medical issues.   She states that since her last admission, she has eaten close to nothing despite trying to consume some foods d/t altered taste. Question whether this is related to abx and other medications. She mentions to Cardiothoracic PA that she had some teeth pulled in April. Denies difficulty chewing or swallowing.  She reports that she is very health conscious when it comes to food as she is a Health and safety inspector. She does not consume soy products or foods with additives or sugar alcohols, to name a few. She mentions that she eats whole foods. When she makes shakes she uses Whey Protein, frozen fruit and water.   Encouraged during this acute period of illness, finding medically tailored nutrition supplements such as Boost, Ensure and Carnation Instant breakfast that she could  try at least for the short term as malnutrition, at this point in time, is more harmful to her than trying to maintain a minimally processed diet given her limited oral intake and increased nutrition needs to support healing.   She states that her usual weight is about 160 lbs and she has noticed that she has lost weight and feels too small for her height.  Review of chart reflects that over the last 3 years, her weight has declined from ~88.9 kg down to 69.9 kg.  Medications: LR @ 116ml/hr, abx  Labs:  Sodium 134 BUN <5  NUTRITION - FOCUSED PHYSICAL EXAM: Patient meets criteria for severe acute malnutrition given reported time frame of decreased oral intake and severe muscle deficits however suspect her malnutrition to have more of an underlying chronic component. Flowsheet Row Most Recent Value  Orbital Region Moderate depletion  Upper Arm Region No depletion  Thoracic and Lumbar Region Mild depletion  Buccal Region Mild depletion  Temple Region Mild depletion  Clavicle Bone Region Severe depletion  Clavicle and Acromion Bone Region Severe depletion  Scapular Bone Region Severe depletion  Dorsal Hand Moderate depletion  Patellar Region Severe depletion  Anterior Thigh Region Severe depletion  Posterior Calf Region Moderate depletion  Edema (RD Assessment) None  Hair Reviewed  Eyes Reviewed  Mouth Other (Comment)  [pt reports having teeth pulled in April]  Skin Reviewed  Nails Reviewed    Diet Order:   Diet Order             Diet NPO time specified  Diet effective now  EDUCATION NEEDS:   Education needs have been addressed  Skin:  Skin Assessment: Reviewed RN Assessment  Last BM:  unknown/PTA  Height:   Ht Readings from Last 1 Encounters:  06/20/24 5' 9 (1.753 m)    Weight:   Wt Readings from Last 1 Encounters:  06/20/24 69.9 kg   BMI:  Body mass index is 22.74 kg/m.  Estimated Nutritional Needs:   Kcal:  1900-2100  Protein:   95-105g  Fluid:  >/=1.9L  Royce Maris, RDN, LDN Clinical Nutrition See AMiON for contact information.

## 2024-06-21 NOTE — Progress Notes (Signed)
 Daily Progress Note Intern Pager: [phone]  Patient name: Sue [name] Medical record number: [id number] Date of birth: May 05, 1966 Age: 58 y.o. Gender: female  Primary Care Provider: Rudy Carlin LABOR, MD Consultants: CT surgery , Infection Disease Code Status: Full Code  Pt Overview and Major Events to Date:  06/20/24 : Admitted for worsening mediastinal abscess   Assessment and Plan: Sue [name] is a 58 y.o. female presenting at [facility] ED after she was called regarding her CT Chest scan result concerning for worsening mediastinal abscess evidence by CTAB w/ contrast on 8/4 shows Multi-septate para-esophageal fluid collection measuring approximately 4 x 3 x 8 cm, which remains concerning for mediastinal abscess.  Pt has been afebrile OVN with Tmax of 99.8. She c/o pain w/ coughing and that she is hungry but has been having a low appetite. Denies coughing up blood, SOB, or palpitation  Pertinent PMH/PSH includes.  Mediastinal abscess Breast cancer s/p radiation Pericarditis Assessment & Plan Mediastinal abscess with possible sepsis CTAB w/ contrast on 8/4 shows Multi-septate para-esophageal fluid collection measuring approximately 4 x 3 x 8 cm, which remains concerning for mediastinal abscess.  - Admit to FMTS, attending Dr. McDiarmid - CT surgery following - pending recommendation  - Continue IVF LR at 125ml/h  - Continue Zosyn , discontinue Vancomycin  per ID recommendation  - Vital signs per floor - NPO   - Blood Culture collected after antibiotic administration due to difficult stick - Airborne isolation for rule out TB  - am BMP, CBC - sputum cultures result pending  - urine legionella result pending  - VTE prophylaxis : Lovenox  - Fall precautions - Delirium precautions  Hypokalemia - IV Potassium 40meq  - Daily BMP Hyponatremia sSodium 132 this morning. No medical intervention needed at this time Continue to monitor s/s - Daily BMP Malnutrition with unintended  weight loss - Dietitian consult - Monitoring for possible refeeding syndrome  - Daily Phos  Chronic health problem Breast cancer s/p radiation therapy  Anemia of neoplastic disease Emphysema lung  FEN/GI: NPO except Meds PPx: Protonix  40 mg IV Daily Dispo:Pending PT recommendations   after ruled out airborne infection   Subjective:  Sue [name] is a 58 y.o. female presenting at [facility] ED after she was called regarding her CT Chest scan result concerning for worsening mediastinal abscess. Patient was recently hospitalized on  7/10-7/10/2024 for pericarditis related to supraglottic and retropharyngeal abscesses, mediastinitis which was treated with IV antibiotic. ID was consulted and follow during the admission. Patients state she barely able to take things by mouth since she was discharged. She states that she has been hungry but unable to eat anything because her food is tasting bad.  Also reports frequent sweating and chills.   In the ED, Patient hemodynamically stable and non-toxic.Pertinent labs including WBC of 12.3 with Lactic acid 2.1, Potassium 3.2 Chest radiograph shows stable right basilar consolidation and complex multilocular bilateral pleural fluid collections, compatible with known history of mediastinal/pulmonary abscess as seen on recent CT.    Objective: Temp:  [97.9 F (36.6 C)-99.8 F (37.7 C)] 98.2 F (36.8 C) (08/08 0610) Pulse Rate:  [90-110] 90 (08/08 0003) Resp:  [15-33] 20 (08/08 0410) BP: (94-129)/(63-89) 94/63 (08/08 0003) SpO2:  [95 %-100 %] 98 % (08/08 0003) Weight:  [69.9 kg] 69.9 kg (08/07 1633)    Laboratory: Most recent CBC Lab Results  Component Value Date   WBC 12.0 (H) 06/21/2024   HGB 8.9 (L) 06/21/2024   HCT 26.4 (  L) 06/21/2024   MCV 95.0 06/21/2024   PLT 536 (H) 06/21/2024   Most recent BMP    Latest Ref Rng & Units 06/21/2024    3:45 AM  BMP  Glucose 70 - 99 mg/dL 96   BUN 6 - 20 mg/dL <5   Creatinine 9.55 - 1.00 mg/dL 9.45    Sodium 864 - 854 mmol/L 132   Potassium 3.5 - 5.1 mmol/L 2.9   Chloride 98 - 111 mmol/L 96   CO2 22 - 32 mmol/L 26   Calcium 8.9 - 10.3 mg/dL 8.4     Imaging/Diagnostic Tests:  EXAM: PORTABLE CHEST 1 VIEW   COMPARISON:  05/22/2024, 06/17/2024 IMPRESSION: 1. Stable right basilar consolidation and complex multilocular bilateral pleural fluid collections, compatible with known history of mediastinal/pulmonary abscess as seen on recent CT.  EXAM:  CT NECK WITH CONTRAST  06/17/2024 02:18:14 PM  IMPRESSION: 1. Multi-septate para-esophageal fluid collection measuring approximately 4 x 3 x 8 cm, which remains concerning for mediastinal abscess. 2. New wedge-shaped area of consolidation within the medial segment of the right middle lobe and new consolidation centrally within the base of the right lower lobe. 3. Probable compressive atelectasis in the right lower lobe adjacent to a loculated pleural fluid collection measuring approximately 12 x 5 x 6 cm. 4. Smaller loculated fluid collections medial to the bases of the right middle and lower lobes. 5. Probable loculated fluid collection in the base of the left pleural cavity measuring approximately 9 x 3 x 6.5 cm.   EXAM: CT NECK WITH CONTRAST 06/17/2024 02:18:14 PM   IMPRESSION: 1. Resolution of right supraglottic and retropharyngeal abscess noted on the previous study. 2. No residual inflammatory changes evident within the neck. 3. Dystrophic calcifications within the left palatine tonsils. 4. Few residual shotty lymph nodes, but no abnormally enlarged or morphologically suspicious nodes.    Sue Houston NOVAK, DO 06/21/2024, 9:04 AM  PGY-1, [facility] FPTS Intern pager: [phone], text pages welcome Secure chat group [facility] Teaching Service

## 2024-06-21 NOTE — Consult Note (Signed)
 Date of Admission:  06/20/2024          Reason for Consult: Enlarging mediastinal abscess on retracted oral antibiotics now with pneumonia and pleural effusions as well    Referring Provider: Krystal McDiarmid   Assessment:  Enlarging mediastinal abscess on appropriate antibiotics for oropharyngeal source of infection Pneumonia with his consolidation of open the right middle lobe base of the right lower lobe and bilateral pleural effusions which are loculated and I suspect are now empyemas Resolved retropharyngeal abscess Severe depression I have zero suspicion for TB or legionella, this is all undoubtedly related to her original infection in July + possibly an aspiration event,  Poor appetite which she had attributed to oral antibiotics but which could also be due to her infection which is worsened in the mediastinal space and in the lungs  Plan:  Continue zosyn  Consult CT surgery to address her mediastinal abscess and her loculated pleural effusions which I suspect are empyemas DC airborne and would not check legionella ag  Dr. Dennise to check in on over the weekend and Dr. Luiz to take over the service on Monday.  Principal Problem:   Mediastinal abscess with possible sepsis Active Problems:   Abscess of supraglottic region   Infection   Leukocytosis   Chronic health problem   Hypokalemia   Hyponatremia   Loculated pleural effusion, bilateral   Unintentional weight loss of more than 10 pounds in 90 days   Malnutrition with unintended weight loss   Scheduled Meds:  enoxaparin  (LOVENOX ) injection  40 mg Subcutaneous Q24H   pantoprazole  (PROTONIX ) IV  40 mg Intravenous Q24H   Continuous Infusions:  acetaminophen      lactated ringers  125 mL/hr at 06/21/24 1055   piperacillin -tazobactam (ZOSYN )  IV 3.375 g (06/21/24 0948)   potassium chloride      PRN Meds:.acetaminophen , ondansetron  (ZOFRAN ) IV  HPI: Sue [name] is a 58 y.o. female whom I saw during her  last hospitalization in July when she was admitted with severe neck pain and found to have a supraglottic and mediastinal abscess.  At the time she was seen by ENT surgery and CT surgery were also called and neither felt that surgical intervention where was required it was felt that she would improve with antibiotics alone she was given Decadron  as well as vancomycin  and Zosyn  and then changed over to Unasyn  and then to oral Augmentin  which we sent her home on she was also found to have pericarditis that time was thought to be reactive and not purulent.  I saw her in follow-up in the clinic on 23 July and she was already experiencing significant fatigue which she thought was due to the antibiotics she has not been able to work because of that and I had to write a letter to excuse her.  She is continue to have trouble with very poor appetite which she has been attributing to the oral antibiotics.  We had ordered a repeat CT scan to ensure that her abscesses were resolving.  CT neck had shown complete resolution of right supraglottic and retropharyngeal abscess with no residual inflammatory changes in the neck.  CT of the chest though unfortunately showed a multiseptated paraesophageal abscess that was 4 x 3 x 8 cm it is larger than the last time this abscess was measured 1 was 2.9 x 1.7 x 4.4 cm.  There was also a new wedge-shaped area of consolidation within the medial segment of the right middle lobe  and consolidation centrally within the base of the right lower lobe and a loculated pleural effusion of the base of the right middle and lower lobes with probable loculated fluid along the base of left pleural cavity.  When I saw the read I instructed her to come to the emergency department to be admitted so that she could be evaluated by cardiothoracic surgery to address her mediastinal abscess which has not been resolving but actually enlarging on appropriate antibiotics for an infection presumed to be of  oropharyngeal origin.  I would suspect her pneumonia and pleural effusions could be potentially due to aspiration.  I have 0 suspicion for tuberculosis and have discontinued her airborne precautions.  I think Zosyn  is a reasonable antibiotic to cover her mediastinal abscess as well as her pneumonia and potentially infected pleural fluid.  The key thing that she needs is cardiothoracic consultation to address the mediastinal abscess perhaps that can be approached via thoracoscopic means and the pleural effusions--which I worry are empyemas could be to be also addressed.  Hopefully she does have surgery and we can get some cultures in the operating room.  She is severely depressed and was crying for almost the entirety of my interview with her today and stating that she is worried that she is going to die from her infection.  For to have her seen by the chaplain or psychiatry but she said she did not want to talk to either.   I have personally spent 84 minutes involved in face-to-face and non-face-to-face activities for this patient on the day of the visit. Professional time spent includes the following activities: Preparing to see the patient (review of tests), Obtaining and/or reviewing separately obtained history (admission/discharge record), Performing a medically appropriate examination and/or evaluation , Ordering medications/tests/procedures, referring and communicating with other health care professionals, Documenting clinical information in the EMR, Independently interpreting results (not separately reported), Communicating results to the patient/family/caregiver, Counseling and educating the patient/family/caregiver and Care coordination (not separately reported).   Evaluation of the patient requires complex antimicrobial therapy evaluation, counseling , isolation needs to reduce disease transmission and risk assessment and mitigation.     Review of Systems: Review of Systems   Constitutional:  Positive for malaise/fatigue. Negative for chills, fever and weight loss.  HENT:  Negative for congestion and sore throat.   Eyes:  Negative for blurred vision and photophobia.  Respiratory:  Negative for cough, shortness of breath and wheezing.   Cardiovascular:  Negative for chest pain, palpitations and leg swelling.  Gastrointestinal:  Negative for abdominal pain, blood in stool, constipation, diarrhea, heartburn, melena, nausea and vomiting.  Genitourinary:  Negative for dysuria, flank pain and hematuria.  Musculoskeletal:  Negative for back pain, falls, joint pain and myalgias.  Skin:  Negative for itching and rash.  Neurological:  Negative for dizziness, focal weakness, loss of consciousness, weakness and headaches.  Endo/Heme/Allergies:  Does not bruise/bleed easily.  Psychiatric/Behavioral:  Positive for depression. Negative for suicidal ideas. The patient is nervous/anxious. The patient does not have insomnia.     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   Personal history  of radiation therapy    Retropharyngeal abscess 05/22/2024   Right foot pain    S/P radiation therapy 12/06/12 -01/23/13   Left Breast/Axilla / 46 Gy / 23 Fractions with a Boost to Left Breast / 14 Gy / 7 Fractions   Use of tamoxifen  (Nolvadex ) 01/2013   Wears glasses     Social History   Tobacco Use   Smoking status: Former    Current packs/day: 0.00    Types: Cigarettes    Quit date: 11/30/1993    Years since quitting: 30.5   Smokeless tobacco: Never  Vaping Use   Vaping status: Never Used   Substance Use Topics   Alcohol use: Not Currently    Comment: 1x per week   Drug use: No    Family History  Problem Relation Age of Onset   Breast cancer Mother 29       blood cancer too   Brain cancer Paternal Aunt        diagnosed in late 20s to early 9s   Breast cancer Cousin        paternal cousin diagnosed; diagnosed in her late 19s   Colon cancer Neg Hx    Colon polyps Neg Hx    Esophageal cancer Neg Hx    Rectal cancer Neg Hx    Stomach cancer Neg Hx    No Known Allergies  OBJECTIVE: Blood pressure 104/71, pulse 85, temperature 98.4 F (36.9 C), resp. rate 20, height 5' 9 (1.753 m), weight 69.9 kg, last menstrual period 10/24/2018, SpO2 95%.  Physical Exam Constitutional:      General: She is not in acute distress.    Appearance: Normal appearance. She is well-developed. She is not ill-appearing or diaphoretic.  HENT:     Head: Normocephalic and atraumatic.     Right Ear: Hearing and external ear normal.     Left Ear: Hearing and external ear normal.     Nose: No nasal deformity or rhinorrhea.     Mouth/Throat:     Mouth: Mucous membranes are moist.     Tongue: No lesions. Tongue does not deviate from midline.     Pharynx: Oropharynx is clear. Uvula midline. No pharyngeal swelling or oropharyngeal exudate.  Eyes:     General: No scleral icterus.    Conjunctiva/sclera: Conjunctivae normal.     Right eye: Right conjunctiva is not injected.     Left eye: Left conjunctiva is not injected.     Pupils: Pupils are equal, round, and reactive to light.  Neck:     Vascular: No JVD.  Cardiovascular:     Rate and Rhythm: Normal rate and regular rhythm.     Heart sounds: S1 normal and S2 normal. No murmur heard.    No friction rub. No gallop.  Pulmonary:     Effort: Pulmonary effort is normal. No respiratory distress.     Breath sounds: No wheezing.  Abdominal:     General: There is no distension.     Palpations: Abdomen is soft.  Musculoskeletal:         General: Normal range of motion.     Right shoulder: Normal.     Left shoulder: Normal.     Cervical back: Normal range of motion and neck supple.     Right hip: Normal.     Left hip: Normal.     Right knee: Normal.     Left knee: Normal.  Lymphadenopathy:     Head:  Right side of head: No submandibular, preauricular or posterior auricular adenopathy.     Left side of head: No submandibular, preauricular or posterior auricular adenopathy.     Cervical: No cervical adenopathy.     Right cervical: No superficial or deep cervical adenopathy.    Left cervical: No superficial or deep cervical adenopathy.  Skin:    General: Skin is warm and dry.     Coloration: Skin is not pale.     Findings: No abrasion, bruising, ecchymosis, erythema, lesion or rash.     Nails: There is no clubbing.  Neurological:     Mental Status: She is alert and oriented to person, place, and time.     Sensory: No sensory deficit.     Coordination: Coordination normal.     Gait: Gait normal.  Psychiatric:        Attention and Perception: She is attentive.        Mood and Affect: Mood is depressed. Affect is tearful.        Speech: Speech normal.        Behavior: Behavior normal. Behavior is cooperative.        Thought Content: Thought content normal.        Cognition and Memory: Cognition and memory normal.        Judgment: Judgment normal.     Lab Results Lab Results  Component Value Date   WBC 12.0 (H) 06/21/2024   HGB 8.9 (L) 06/21/2024   HCT 26.4 (L) 06/21/2024   MCV 95.0 06/21/2024   PLT 536 (H) 06/21/2024    Lab Results  Component Value Date   CREATININE 0.54 06/21/2024   BUN <5 (L) 06/21/2024   NA 132 (L) 06/21/2024   K 2.9 (L) 06/21/2024   CL 96 (L) 06/21/2024   CO2 26 06/21/2024    Lab Results  Component Value Date   ALT 12 06/20/2024   AST 22 06/20/2024   ALKPHOS 119 06/20/2024   BILITOT 0.5 06/20/2024     Microbiology: Recent Results (from the past 240 hours)  Blood  Culture (routine x 2)     Status: None (Preliminary result)   Collection Time: 06/20/24  6:58 PM   Specimen: BLOOD  Result Value Ref Range Status   Specimen Description BLOOD SITE NOT SPECIFIED  Final   Special Requests   Final    BOTTLES DRAWN AEROBIC AND ANAEROBIC Blood Culture adequate volume   Culture   Final    NO GROWTH < 12 HOURS Performed at [facility], [address] [place], [place] [address]    Report Status PENDING  Incomplete    Jomarie Fleeta Rothman, MD [facility] [phone] pager  06/21/2024, 11:55 AM

## 2024-06-22 ENCOUNTER — Other Ambulatory Visit: Payer: Self-pay | Admitting: Obstetrics

## 2024-06-22 DIAGNOSIS — F32A Depression, unspecified: Secondary | ICD-10-CM

## 2024-06-22 DIAGNOSIS — J853 Abscess of mediastinum: Secondary | ICD-10-CM | POA: Diagnosis not present

## 2024-06-22 LAB — CBC WITH DIFFERENTIAL/PLATELET
Abs Immature Granulocytes: 0.07 K/uL (ref 0.00–0.07)
Basophils Absolute: 0 K/uL (ref 0.0–0.1)
Basophils Relative: 0 %
Eosinophils Absolute: 0 K/uL (ref 0.0–0.5)
Eosinophils Relative: 0 %
HCT: 27.2 % — ABNORMAL LOW (ref 36.0–46.0)
Hemoglobin: 9.1 g/dL — ABNORMAL LOW (ref 12.0–15.0)
Immature Granulocytes: 1 %
Lymphocytes Relative: 11 %
Lymphs Abs: 1.4 K/uL (ref 0.7–4.0)
MCH: 31.8 pg (ref 26.0–34.0)
MCHC: 33.5 g/dL (ref 30.0–36.0)
MCV: 95.1 fL (ref 80.0–100.0)
Monocytes Absolute: 1.4 K/uL — ABNORMAL HIGH (ref 0.1–1.0)
Monocytes Relative: 11 %
Neutro Abs: 9.4 K/uL — ABNORMAL HIGH (ref 1.7–7.7)
Neutrophils Relative %: 77 %
Platelets: 522 K/uL — ABNORMAL HIGH (ref 150–400)
RBC: 2.86 MIL/uL — ABNORMAL LOW (ref 3.87–5.11)
RDW: 13.9 % (ref 11.5–15.5)
WBC: 12.3 K/uL — ABNORMAL HIGH (ref 4.0–10.5)
nRBC: 0 % (ref 0.0–0.2)

## 2024-06-22 LAB — BASIC METABOLIC PANEL WITH GFR
Anion gap: 10 (ref 5–15)
BUN: <5 mg/dL — ABNORMAL LOW (ref 6–20)
CO2: 24 mmol/L (ref 22–32)
Calcium: 8.3 mg/dL — ABNORMAL LOW (ref 8.9–10.3)
Chloride: 99 mmol/L (ref 98–111)
Creatinine, Ser: 0.52 mg/dL (ref 0.44–1.00)
GFR, Estimated: >60 mL/min (ref >60)
Glucose, Bld: 105 mg/dL — ABNORMAL HIGH (ref 70–99)
Potassium: 3.1 mmol/L — ABNORMAL LOW (ref 3.5–5.1)
Sodium: 133 mmol/L — ABNORMAL LOW (ref 135–145)

## 2024-06-22 LAB — PHOSPHORUS: Phosphorus: 3.5 mg/dL (ref 2.5–4.6)

## 2024-06-22 LAB — MAGNESIUM: Magnesium: 2 mg/dL (ref 1.7–2.4)

## 2024-06-22 MED ORDER — POTASSIUM CHLORIDE CRYS ER 20 MEQ PO TBCR
40.0000 meq | EXTENDED_RELEASE_TABLET | Freq: Once | ORAL | Status: AC
  Administered 2024-06-22: 40 meq via ORAL
  Filled 2024-06-22: qty 2

## 2024-06-22 MED ORDER — POTASSIUM CHLORIDE 10 MEQ/100ML IV SOLN
10.0000 meq | INTRAVENOUS | Status: AC
  Administered 2024-06-22 (×4): 10 meq via INTRAVENOUS
  Filled 2024-06-22 (×4): qty 100

## 2024-06-22 NOTE — Progress Notes (Signed)
 [facility]  Date of Admission:  06/20/2024   Total days of inpatient antibiotics 3  Principal Problem:   Mediastinal abscess with possible sepsis Active Problems:   Abscess of supraglottic region   Infection   Leukocytosis   Chronic health problem   Hypokalemia   Hyponatremia   Loculated pleural effusion, bilateral   Unintentional weight loss of more than 10 pounds in 90 days   Malnutrition with unintended weight loss   Protein-calorie malnutrition, severe          Assessment: 63 old female admitted with #Enlarging mediastinal abscess on appropriate antibiotic for I&D also #Pneumonia-consolidation of right middle and right lower lobe with loculated - CTS consulted # Suspected empyema's #Resolved retropharyngeal abscess #Severe depression #Poor appetite Recommendations: -Coninue zosyn  -CTS consulted plan on CT-guided drain placement for dominant pleural fluid collection.  Please obtain cystic mediastinal collection, noted no evidence of leak on esophagram. -   Follow-up blood cultures       Microbiology:   Antibiotics: Piptazo 8/7- Cultures: Blood 8/7 NG Urine  Other   SUBJECTIVE: Resting in bed Interval: Afebrile overnight  Review of Systems: Review of Systems  All other systems reviewed and are negative.    Scheduled Meds:  enoxaparin  (LOVENOX ) injection  40 mg Subcutaneous Q24H   pantoprazole  (PROTONIX ) IV  40 mg Intravenous Q24H   Continuous Infusions:  piperacillin -tazobactam (ZOSYN )  IV 3.375 g (06/22/24 0732)   PRN Meds:.ondansetron  (ZOFRAN ) IV No Known Allergies  OBJECTIVE: Vitals:   06/21/24 2307 06/22/24 0305 06/22/24 0727 06/22/24 1142  BP: 95/62 114/71 105/73 105/71  Pulse: 90 98 89 94  Resp: 20 20 18 18   Temp: 98 F (36.7 C) 98.6 F (37 C) 98.4 F (36.9 C) 98.3 F (36.8 C)  TempSrc: Oral Oral Oral Oral  SpO2: 95% 97% 96% 96%  Weight:      Height:       Body mass index is 22.74  kg/m.  Physical Exam Constitutional:      Appearance: Normal appearance.  HENT:     Head: Normocephalic and atraumatic.     Right Ear: Tympanic membrane normal.     Left Ear: Tympanic membrane normal.     Nose: Nose normal.     Mouth/Throat:     Mouth: Mucous membranes are moist.  Eyes:     Extraocular Movements: Extraocular movements intact.     Conjunctiva/sclera: Conjunctivae normal.     Pupils: Pupils are equal, round, and reactive to light.  Cardiovascular:     Rate and Rhythm: Normal rate and regular rhythm.     Heart sounds: No murmur heard.    No friction rub. No gallop.  Pulmonary:     Effort: Pulmonary effort is normal.     Breath sounds: Normal breath sounds.  Abdominal:     General: Abdomen is flat.     Palpations: Abdomen is soft.  Musculoskeletal:        General: Normal range of motion.  Skin:    General: Skin is warm and dry.  Neurological:     General: No focal deficit present.     Mental Status: She is alert and oriented to person, place, and time.  Psychiatric:        Mood and Affect: Mood normal.       Lab Results Lab Results  Component Value Date   WBC 12.3 (H) 06/22/2024   HGB 9.1 (L) 06/22/2024   HCT  27.2 (L) 06/22/2024   MCV 95.1 06/22/2024   PLT 522 (H) 06/22/2024    Lab Results  Component Value Date   CREATININE 0.52 06/22/2024   BUN <5 (L) 06/22/2024   NA 133 (L) 06/22/2024   K 3.1 (L) 06/22/2024   CL 99 06/22/2024   CO2 24 06/22/2024    Lab Results  Component Value Date   ALT 12 06/20/2024   AST 22 06/20/2024   ALKPHOS 119 06/20/2024   BILITOT 0.5 06/20/2024        Loney Stank, MD [facility] 06/22/2024, 1:02 PM Evaluation of this patient requires complex antimicrobial therapy evaluation and counseling + isolation needs for disease transmission risk assessment and mitigation

## 2024-06-22 NOTE — Plan of Care (Signed)
 Spoke with Garnette Hint, patient's husband via phone to provide updates at his request.  He confirmed patient's name and date of birth.  I let him know of plan for drain placement by cardiothoracic surgery, and that we are continuing antibiotics.  He is concerned that she has not eaten since being discharged from the hospital last time.  I let him know that we have consulted our dietitian team and are monitoring her nutrition status. Will plan to get PT involved closer to discharge.  He is appreciative of updates, and would like a phone call if he is not in the room when we're rounding.

## 2024-06-23 DIAGNOSIS — J853 Abscess of mediastinum: Secondary | ICD-10-CM | POA: Diagnosis not present

## 2024-06-23 LAB — BASIC METABOLIC PANEL WITH GFR
Anion gap: 9 (ref 5–15)
BUN: <5 mg/dL — ABNORMAL LOW (ref 6–20)
CO2: 22 mmol/L (ref 22–32)
Calcium: 8.4 mg/dL — ABNORMAL LOW (ref 8.9–10.3)
Chloride: 103 mmol/L (ref 98–111)
Creatinine, Ser: 0.49 mg/dL (ref 0.44–1.00)
GFR, Estimated: >60 mL/min (ref >60)
Glucose, Bld: 92 mg/dL (ref 70–99)
Potassium: 3.7 mmol/L (ref 3.5–5.1)
Sodium: 134 mmol/L — ABNORMAL LOW (ref 135–145)

## 2024-06-23 LAB — CULTURE, RESPIRATORY W GRAM STAIN

## 2024-06-23 LAB — CBC WITH DIFFERENTIAL/PLATELET
Abs Immature Granulocytes: 0.06 K/uL (ref 0.00–0.07)
Basophils Absolute: 0 K/uL (ref 0.0–0.1)
Basophils Relative: 0 %
Eosinophils Absolute: 0 K/uL (ref 0.0–0.5)
Eosinophils Relative: 0 %
HCT: 26.4 % — ABNORMAL LOW (ref 36.0–46.0)
Hemoglobin: 8.8 g/dL — ABNORMAL LOW (ref 12.0–15.0)
Immature Granulocytes: 1 %
Lymphocytes Relative: 11 %
Lymphs Abs: 1.2 K/uL (ref 0.7–4.0)
MCH: 31.8 pg (ref 26.0–34.0)
MCHC: 33.3 g/dL (ref 30.0–36.0)
MCV: 95.3 fL (ref 80.0–100.0)
Monocytes Absolute: 1.2 K/uL — ABNORMAL HIGH (ref 0.1–1.0)
Monocytes Relative: 11 %
Neutro Abs: 8.7 K/uL — ABNORMAL HIGH (ref 1.7–7.7)
Neutrophils Relative %: 77 %
Platelets: 492 K/uL — ABNORMAL HIGH (ref 150–400)
RBC: 2.77 MIL/uL — ABNORMAL LOW (ref 3.87–5.11)
RDW: 14.1 % (ref 11.5–15.5)
WBC: 11.3 K/uL — ABNORMAL HIGH (ref 4.0–10.5)
nRBC: 0 % (ref 0.0–0.2)

## 2024-06-23 LAB — LEGIONELLA PNEUMOPHILA SEROGP 1 UR AG: L. pneumophila Serogp 1 Ur Ag: NEGATIVE

## 2024-06-23 LAB — MAGNESIUM: Magnesium: 1.9 mg/dL (ref 1.7–2.4)

## 2024-06-23 LAB — PHOSPHORUS: Phosphorus: 3.6 mg/dL (ref 2.5–4.6)

## 2024-06-23 NOTE — Assessment & Plan Note (Addendum)
-  Appreciate ongoing RD recommendations - Monitoring for refeeding with daily Phos, BMP, mag - Continue to encourage p.o. intake as able, on CLD with nutrition supplements

## 2024-06-23 NOTE — Plan of Care (Signed)

## 2024-06-23 NOTE — Progress Notes (Addendum)
     Daily Progress Note Intern Pager: [phone]  Patient name: Sue [name] Medical record number: [id number] Date of birth: 12/31/1965 Age: 58 y.o. Gender: female  Primary Care Provider: Rudy [name] LABOR, MD Consultants: CT surgery, ID Code Status: Full code  Pt Overview and Major Events to Date:   8/11: Admitted  Medical Decision Making: Sue [name] is a 58 year old female admitted for failure to thrive who met SIRS criteria in the setting of mediastinal abscesses.  Continues on IV Zosyn  while awaiting likely CT-guided drain placement for dominant pleural fluid collection.  Dietitian following for malnutrition. PMH includes breast cancer s/p radiation therapy, anemia of neoplastic disease, emphysema Assessment & Plan Mediastinal abscess with possible sepsis Loculated pleural effusion, bilateral Remains clinically stable.  Afebrile overnight and leukocytosis continues to improve.  -Appreciate ongoing CT surgery recommendations, likely plan for CT-guided drain placement for dominant pleural fluid collection - Continue LR 125 mL/h - Continue IV Zosyn  (8/7- ), may require prolonged course as mediastinal abscess not drainable per CTS -Blood cultures with NG x 2 days (collected after initiation of antibiotics) -Sputum culture with klebsiella, pending susceptibilities - Pending Legionella antigen -Continue daily CBC Malnutrition with unintended weight loss Protein-calorie malnutrition, severe Hypokalemia (Resolved: 06/23/2024) -Appreciate ongoing RD recommendations - Monitoring for refeeding with daily Phos, BMP, mag - Continue to encourage p.o. intake as able, on CLD with nutrition supplements Hyponatremia Improving,134 today. -CTM with daily BMP Chronic health problem Breast cancer s/p radiation therapy  Anemia of neoplastic disease Emphysema  FEN/GI: CLD PPx: Lovenox  Dispo: Home pending clinical improvement.  Barriers include IV antibiotics and likely surgical  intervention  Subjective:  Patient states she feels fine this morning.  Has not tried to eat yet today.  Denies pain.  No questions or concerns.  Objective: Temp:  [98 F (36.7 C)-98.6 F (37 C)] 98.3 F (36.8 C) (08/10 0406) Pulse Rate:  [89-94] 92 (08/10 0406) Resp:  [18-19] 18 (08/10 0406) BP: (94-111)/(63-81) 94/65 (08/10 0406) SpO2:  [95 %-99 %] 95 % (08/10 0406) Physical Exam: General: Resting comfortably in bed, awake and alert, in no acute distress Cardiovascular: RRR, no S1/S2, no M/R/G Respiratory: CTAB, normal WOB on RA Abdomen: Normoactive bowel sounds, soft, nontender and nondistended Extremities: No edema to BLE  Laboratory: Most recent CBC Lab Results  Component Value Date   WBC 11.3 (H) 06/23/2024   HGB 8.8 (L) 06/23/2024   HCT 26.4 (L) 06/23/2024   MCV 95.3 06/23/2024   PLT 492 (H) 06/23/2024   Most recent BMP    Latest Ref Rng & Units 06/23/2024    3:39 AM  BMP  Glucose 70 - 99 mg/dL 92   BUN 6 - 20 mg/dL <5   Creatinine 9.55 - 1.00 mg/dL 9.50   Sodium 864 - 854 mmol/L 134   Potassium 3.5 - 5.1 mmol/L 3.7   Chloride 98 - 111 mmol/L 103   CO2 22 - 32 mmol/L 22   Calcium 8.9 - 10.3 mg/dL 8.4     Sue Song, MD 06/23/2024, 5:23 AM  PGY-2, [facility] FPTS Intern pager: [phone], text pages welcome Secure chat group [facility] Teaching Service

## 2024-06-23 NOTE — Assessment & Plan Note (Addendum)
 Improving,134 today. -CTM with daily BMP

## 2024-06-23 NOTE — Assessment & Plan Note (Addendum)
 Remains clinically stable.  Afebrile overnight and leukocytosis continues to improve.  -Appreciate ongoing CT surgery recommendations, likely plan for CT-guided drain placement for dominant pleural fluid collection - Continue LR 125 mL/h - Continue IV Zosyn  (8/7- ), may require prolonged course as mediastinal abscess not drainable per CTS -Blood cultures with NG x 2 days (collected after initiation of antibiotics) -Sputum culture with klebsiella, pending susceptibilities - Pending Legionella antigen -Continue daily CBC

## 2024-06-23 NOTE — Assessment & Plan Note (Signed)
 Breast cancer s/p radiation therapy  Anemia of neoplastic disease Emphysema

## 2024-06-24 ENCOUNTER — Inpatient Hospital Stay (HOSPITAL_COMMUNITY)

## 2024-06-24 ENCOUNTER — Encounter (HOSPITAL_COMMUNITY): Payer: Self-pay | Admitting: Physician Assistant

## 2024-06-24 LAB — CBC WITH DIFFERENTIAL/PLATELET
Abs Immature Granulocytes: 0.04 K/uL (ref 0.00–0.07)
Basophils Absolute: 0 K/uL (ref 0.0–0.1)
Basophils Relative: 0 %
Eosinophils Absolute: 0.1 K/uL (ref 0.0–0.5)
Eosinophils Relative: 1 %
HCT: 27.8 % — ABNORMAL LOW (ref 36.0–46.0)
Hemoglobin: 9.1 g/dL — ABNORMAL LOW (ref 12.0–15.0)
Immature Granulocytes: 0 %
Lymphocytes Relative: 12 %
Lymphs Abs: 1.3 K/uL (ref 0.7–4.0)
MCH: 31.3 pg (ref 26.0–34.0)
MCHC: 32.7 g/dL (ref 30.0–36.0)
MCV: 95.5 fL (ref 80.0–100.0)
Monocytes Absolute: 1.1 K/uL — ABNORMAL HIGH (ref 0.1–1.0)
Monocytes Relative: 10 %
Neutro Abs: 8.5 K/uL — ABNORMAL HIGH (ref 1.7–7.7)
Neutrophils Relative %: 77 %
Platelets: 494 K/uL — ABNORMAL HIGH (ref 150–400)
RBC: 2.91 MIL/uL — ABNORMAL LOW (ref 3.87–5.11)
RDW: 14.2 % (ref 11.5–15.5)
WBC: 11.1 K/uL — ABNORMAL HIGH (ref 4.0–10.5)
nRBC: 0 % (ref 0.0–0.2)

## 2024-06-24 LAB — BASIC METABOLIC PANEL WITH GFR
Anion gap: 9 (ref 5–15)
BUN: <5 mg/dL — ABNORMAL LOW (ref 6–20)
CO2: 22 mmol/L (ref 22–32)
Calcium: 8.3 mg/dL — ABNORMAL LOW (ref 8.9–10.3)
Chloride: 100 mmol/L (ref 98–111)
Creatinine, Ser: 0.49 mg/dL (ref 0.44–1.00)
GFR, Estimated: >60 mL/min (ref >60)
Glucose, Bld: 98 mg/dL (ref 70–99)
Potassium: 3.3 mmol/L — ABNORMAL LOW (ref 3.5–5.1)
Sodium: 131 mmol/L — ABNORMAL LOW (ref 135–145)

## 2024-06-24 LAB — PHOSPHORUS: Phosphorus: 3.6 mg/dL (ref 2.5–4.6)

## 2024-06-24 LAB — MAGNESIUM: Magnesium: 1.9 mg/dL (ref 1.7–2.4)

## 2024-06-24 MED ORDER — OXYCODONE HCL 5 MG PO TABS
2.5000 mg | ORAL_TABLET | ORAL | Status: DC | PRN
  Administered 2024-06-25 – 2024-06-26 (×4): 2.5 mg via ORAL
  Filled 2024-06-24 (×2): qty 1

## 2024-06-24 MED ORDER — SENNOSIDES-DOCUSATE SODIUM 8.6-50 MG PO TABS
1.0000 | ORAL_TABLET | Freq: Every day | ORAL | Status: DC
  Administered 2024-06-26 – 2024-06-27 (×3): 1 via ORAL
  Filled 2024-06-24 (×2): qty 1

## 2024-06-24 MED ORDER — SODIUM CHLORIDE 0.9 % IV SOLN
1.0000 g | INTRAVENOUS | Status: DC
  Administered 2024-06-24 – 2024-06-28 (×8): 1 g via INTRAVENOUS
  Filled 2024-06-24 (×5): qty 1000

## 2024-06-24 MED ORDER — MIDAZOLAM HCL 2 MG/2ML IJ SOLN
INTRAMUSCULAR | Status: AC
  Filled 2024-06-24: qty 2

## 2024-06-24 MED ORDER — MIDAZOLAM HCL 2 MG/2ML IJ SOLN
INTRAMUSCULAR | Status: AC | PRN
Start: 2024-06-24 — End: 2024-06-24
  Administered 2024-06-24 (×4): .5 mg via INTRAVENOUS

## 2024-06-24 MED ORDER — POTASSIUM CHLORIDE CRYS ER 20 MEQ PO TBCR
40.0000 meq | EXTENDED_RELEASE_TABLET | Freq: Four times a day (QID) | ORAL | Status: AC
Start: 2024-06-24 — End: 2024-06-24
  Administered 2024-06-24 (×4): 40 meq via ORAL
  Filled 2024-06-24 (×2): qty 2

## 2024-06-24 MED ORDER — LIDOCAINE HCL 1 % IJ SOLN
10.0000 mL | Freq: Once | INTRAMUSCULAR | Status: AC
  Administered 2024-06-24 (×2): 10 mL via INTRADERMAL
  Filled 2024-06-24: qty 10

## 2024-06-24 MED ORDER — POLYETHYLENE GLYCOL 3350 17 G PO PACK
17.0000 g | PACK | Freq: Every day | ORAL | Status: DC
  Filled 2024-06-24 (×4): qty 1

## 2024-06-24 MED ORDER — OXYCODONE HCL 5 MG PO TABS: 2.5000 mg | ORAL_TABLET | ORAL | Status: DC | PRN

## 2024-06-24 MED ORDER — OXYCODONE HCL 5 MG PO TABS
2.5000 mg | ORAL_TABLET | Freq: Once | ORAL | Status: AC
  Administered 2024-06-24 (×2): 2.5 mg via ORAL
  Filled 2024-06-24: qty 1

## 2024-06-24 MED ORDER — FENTANYL CITRATE (PF) 100 MCG/2ML IJ SOLN
INTRAMUSCULAR | Status: AC | PRN
  Administered 2024-06-24 (×2): 25 ug via INTRAVENOUS

## 2024-06-24 MED ORDER — MAGNESIUM SULFATE 2 GM/50ML IV SOLN
2.0000 g | Freq: Once | INTRAVENOUS | Status: AC
  Administered 2024-06-24 (×2): 2 g via INTRAVENOUS
  Filled 2024-06-24: qty 50

## 2024-06-24 MED ORDER — ACETAMINOPHEN 325 MG PO TABS
650.0000 mg | ORAL_TABLET | Freq: Four times a day (QID) | ORAL | Status: DC | PRN
  Administered 2024-06-24 (×2): 650 mg via ORAL
  Filled 2024-06-24: qty 2

## 2024-06-24 MED ORDER — FENTANYL CITRATE (PF) 100 MCG/2ML IJ SOLN
INTRAMUSCULAR | Status: AC
Start: 2024-06-24 — End: 2024-06-24
  Filled 2024-06-24: qty 2

## 2024-06-24 MED ORDER — SODIUM CHLORIDE 0.9% FLUSH
10.0000 mL | Freq: Three times a day (TID) | INTRAVENOUS | Status: DC
  Administered 2024-06-24 – 2024-06-27 (×18): 10 mL via INTRAPLEURAL

## 2024-06-24 NOTE — Assessment & Plan Note (Addendum)
 Afebrile overnight and leukocytosis WBC 11.1 today from 11.3. Sputum culture shows klebsiella. Legionella antigen negative result - Appreciate ongoing CT surgery recommendations -  Plan for CT-guided drain placement for dominant pleural fluid collection today with radiology - Transition IV Zosyn  (8/7-8/11 ) to IV Ertapenem  1 g : may require prolonged course as mediastinal abscess not drainable per CTS - Continue daily CBC

## 2024-06-24 NOTE — Consult Note (Signed)
 Chief Complaint: Right chest fluid collections  Referring Provider(s): [place] McIntryre  Supervising Physician: Vanice Revel  Patient Status: [facility] - In-pt  History of Present Illness: Sue [name] is a 58 y.o. female admitted for failure to thrive who met SIRS criteria in the setting of mediastinal abscesses.   History per Con Bend, PA-C/ Dr. Dwaine note = ....was admitted to the hospital on 07/09 with a sore throat and difficulty swallowing that did not improve after Amoxicillin  given by the urgent care diagnosed with otitis externa. CT at that time showed supraglottic, retropharyngeal abscesses, peritonsillar abscesses and mediastinitis. She was also diagnosed with pericarditis at this time and followed by cardiology. Echocardiogram showed LVEF 55-60%, small pericardial effusion, mild mitral valve regurgitation, and borderline dilatation of the ascending aorta measuring 39mm. She was treated with colchicine  and ibuprofen . Dr. Lucas was called and notified and he advised that the source of the abscess is in the pharynx or esophagus with spread into the mediastinum but there was no fluid collection that was amenable to CT surgery intervention, he recommended antibiotic therapy and ENT management of any head and neck abscesses with drainage if indicated. ENT did not feel I&D was necessary and her symptoms continued to improve with IV antibiotics including Vancomycin  and Zosyn  which was transitioned to Unasyn  with transition to PO Augmentin  for 2 months at discharge as well as IV dexamethasone  07/10-07/11.....     CT scan done 06/19/24 showed= 1. Multi-septate para-esophageal fluid collection measuring approximately 4 x 3 x 8 cm, which remains concerning for mediastinal abscess. . 2. New wedge-shaped area of consolidation within the medial segment of the right middle lobe and new consolidation centrally within the base of the right lower lobe. 3. Probable compressive  atelectasis in the right lower lobe adjacent to a loculated pleural fluid collection measuring approximately 12 x 5 x 6 cm. 4. Smaller loculated fluid collections medial to the bases of the right middle and lower lobes. 5. Probable loculated fluid collection in the base of the left pleural cavity measuring approximately 9 x 3 x 6.5 cm.   Thoracic surgery has evaluated patient but she is refusing surgical intervention.   Dr.Lightfoots plan reads= ...dominant pleural fluid collection that appears amenable to CT guided drain placement. In regards to the mediastinal collection, there is no evidence of leak on the esophagram. This will be very challenging to address surgically without injury to the esophagus or aorta.   IR is asked to evaluate for drainage of largest collection.  She is NPO. Lovenox  was held this am.  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   Personal history of radiation therapy    Retropharyngeal abscess 05/22/2024   Right foot pain    S/P radiation therapy 12/06/12 -01/23/13   Left Breast/Axilla / 46 Gy / 23 Fractions with a Boost to Left Breast / 14 Gy / 7 Fractions   Use of tamoxifen  (Nolvadex ) 01/2013   Wears glasses     Past Surgical History:  Procedure Laterality Date   ACHILLES TENDON SURGERY Right 06/02/2020   Procedure:  ACHILLES LENGTHENING/KIDNER;  Surgeon: Elsa Lonni SAUNDERS, MD;  Location: [facility];  Service: Orthopedics;  Laterality: Right;   BREAST LUMPECTOMY Left    takes tamoxifen    CALCANEAL OSTEOTOMY Right 06/02/2020    Procedure: RIGHT LATERAL DISPLACEMENT CALCANEAL OSTEOTOMY, FLEXOR DIGITORUM LONGUS TRANSFER, SPRING LIGAMENT RECONSTRUCTION, POSTERIOR TIBIAL TENDON DEBRIDEMENT, DEEP ORTHOPEDIC HARDWARE REMOVAL, MEDIAL CUNEIFORM PLANTAR FLEXION OSTEOTOMY AND ACHILLES LENGTHENING, PARTIAL RESECTION OF NAVICULAR;  Surgeon: Elsa Lonni SAUNDERS, MD;  Location: [facility];  Service: Orthopedics;  Latera   DILATION AND CURETTAGE OF UTERUS     Following Miscarriage   FOOT FUSION  3/09   ankle rt   HARDWARE REMOVAL Right 06/02/2020   Procedure: HARDWARE REMOVAL;  Surgeon: Elsa Lonni SAUNDERS, MD;  Location: [facility];  Service: Orthopedics;  Laterality: Right;   Left Breast Lumpectomy  08/28/12   Left Breast Needle Core Biopsy  07/26/12   UOQ - Ductal Carcinoma In Situ with Necrosis. Microcalcifications Identified   METATARSAL OSTEOTOMY Right 06/02/2020   Procedure: METATARSAL OSTEOTOMY;  Surgeon: Elsa Lonni SAUNDERS, MD;  Location: [facility];  Service: Orthopedics;  Laterality: Right;   RE-EXCISION OF BREAST CANCER,SUPERIOR MARGINS  10/30/2012   Procedure: RE-EXCISION OF BREAST CANCER,SUPERIOR MARGINS;  Surgeon: Elon CHRISTELLA Pacini, MD;  Location: [facility];  Service: General;  Laterality: N/A;  left partial mastectomy with excision of margins   re-excision of left breast cancer on 09/10/12      Allergies: Patient has no known allergies.  Medications: Prior to Admission medications   Medication Sig Start Date End Date Taking? Authorizing Provider  acetaminophen  (TYLENOL ) 500 MG tablet Take 1,500 mg by mouth as needed for mild pain (pain score 1-3) or moderate pain (pain score 4-6).   Yes [provider]  amoxicillin -clavulanate (AUGMENTIN ) 875-125 MG tablet Take 1 tablet by mouth 2 (two) times daily. 06/13/24  Yes Fleeta Rothman, Jomarie SAILOR, MD  colchicine  0.6 MG tablet Take 1 tablet (0.6 mg total) by mouth 2 (two) times daily. Patient not taking: Reported on 06/20/2024  05/25/24   Shitarev, Dimitry, MD  ibuprofen  (ADVIL ) 800 MG tablet Take 1 tablet (800 mg total) by mouth every 8 (eight) hours as needed (pain). Take with food to avoid stomach upset. Do not take any additional NSAIDs while on this. You may take tylenol  in addition to this if needed for extra pain relief. Patient not taking: Reported on 06/20/2024 05/20/24   Murrill, Samantha, FNP  megestrol  (MEGACE ) 40 MG tablet Take 1 tablet (40 mg total) by mouth 2 (two) times daily. Patient not taking: Reported on 06/20/2024 06/19/24   Fleeta Rothman, Jomarie SAILOR, MD  oxyCODONE  (OXY IR/ROXICODONE ) 5 MG immediate release tablet Take 0.5 tablets (2.5 mg total) by mouth every 4 (four) hours as needed for up to 10 doses for severe pain (pain score 7-10). Patient not taking: No sig reported 05/25/24   Toma Dimitry, MD     Family History  Problem Relation Age of Onset   Breast cancer Mother 78       blood cancer too   Brain cancer Paternal Aunt        diagnosed in late 17s to early 63s   Breast cancer Cousin        paternal cousin diagnosed; diagnosed in her late 82s   Colon cancer Neg Hx    Colon polyps Neg Hx    Esophageal cancer Neg Hx    Rectal cancer Neg Hx    Stomach cancer  Neg Hx     Social History   Socioeconomic History   Marital status: Married    Spouse name: Not on file   Number of children: 1   Years of education: Not on file   Highest education level: Bachelor's degree (e.g., BA, AB, BS)  Occupational History   Not on file  Tobacco Use   Smoking status: Former    Current packs/day: 0.00    Types: Cigarettes    Quit date: 11/30/1993    Years since quitting: 30.5   Smokeless tobacco: Never  Vaping Use   Vaping status: Never Used  Substance and Sexual Activity   Alcohol use: Not Currently    Comment: 1x per week   Drug use: No   Sexual activity: Yes    Partners: Male    Birth control/protection: Post-menopausal  Other Topics Concern   Not on file  Social History Narrative   Not on file    Social Drivers of Health   Financial Resource Strain: Not on file  Food Insecurity: Patient Declined (06/23/2024)   Hunger Vital Sign    Worried About Running Out of Food in the Last Year: Patient declined    Ran Out of Food in the Last Year: Patient declined  Transportation Needs: Patient Declined (06/23/2024)   PRAPARE - Administrator, Civil Service (Medical): Patient declined    Lack of Transportation (Non-Medical): Patient declined  Physical Activity: Not on file  Stress: Not on file  Social Connections: Unknown (03/29/2022)   Received from [employer]   Social Network    Social Network: Not on file     Review of Systems: A 12 point ROS discussed and pertinent positives are indicated in the HPI above.  All other systems are negative.  Review of Systems  Vital Signs: BP 110/73 (BP Location: Right Arm)   Pulse 90   Temp 97.9 F (36.6 C) (Oral)   Resp 16   Ht 5' 9 (1.753 m)   Wt 154 lb (69.9 kg)   LMP 10/24/2018   SpO2 99%   BMI 22.74 kg/m   Advance Care Plan: The advanced care place/surrogate decision maker was discussed at the time of visit and the patient did not wish to discuss or was not able to name a surrogate decision maker or provide an advance care plan.  Physical Exam Vitals reviewed.  Constitutional:      Appearance: Normal appearance.  HENT:     Head: Normocephalic and atraumatic.  Eyes:     Extraocular Movements: Extraocular movements intact.  Cardiovascular:     Rate and Rhythm: Normal rate and regular rhythm.  Pulmonary:     Effort: Pulmonary effort is normal. No respiratory distress.     Breath sounds: Normal breath sounds.  Abdominal:     Palpations: Abdomen is soft.  Musculoskeletal:        General: Normal range of motion.     Cervical back: Normal range of motion.  Skin:    General: Skin is warm and dry.  Neurological:     General: No focal deficit present.     Mental Status: She is alert and oriented to person, place,  and time.  Psychiatric:        Mood and Affect: Mood normal.        Behavior: Behavior normal.        Thought Content: Thought content normal.        Judgment: Judgment normal.     Imaging:  DG ESOPHAGUS W SINGLE CM (SOL OR THIN BA) Result Date: 06/21/2024 CLINICAL DATA:  Paraesophageal fluid collection which has increased over the past month. Exam limited by patient anxiety and patient declining adequate volume swallows due to contrast taste. EXAM: ESOPHAGUS/BARIUM SWALLOW/TABLET STUDY TECHNIQUE: Single contrast examination was performed using water soluble contrast. This exam was performed by Laymon Coast, NP, and was supervised and interpreted by Dr. Landy. FLUOROSCOPY: Radiation Exposure Index (as provided by the fluoroscopic device): 4.6 mGy Kerma COMPARISON:  CT scan of June 17, 2024. FINDINGS: Swallowing: Appears normal. No vestibular penetration or aspiration seen. Pharynx: Unremarkable. Esophagus: Normal appearance.  No extraluminal contrast identified. Esophageal motility: Tertiary contractions resulting in mild dysmotility seen. Hiatal Hernia: None. Gastroesophageal reflux: None visualized. Ingested 13 mm barium tablet: Not given Other: None. IMPRESSION: Limited exam of the esophagus. Mild dysmotility noted. No extraluminal contrast identified. Electronically Signed   By: Lynwood Landy Raddle M.D.   On: 06/21/2024 14:09   DG Chest Port 1 View Result Date: 06/20/2024 CLINICAL DATA:  Sepsis EXAM: PORTABLE CHEST 1 VIEW COMPARISON:  05/22/2024, 06/17/2024 FINDINGS: Single frontal view of the chest demonstrates a stable cardiac silhouette. Persistent right basilar consolidation. Multilocular complex bilateral fluid collections, right greater than left, again noted and unchanged since earlier CT. No pneumothorax. No acute bony abnormalities. IMPRESSION: 1. Stable right basilar consolidation and complex multilocular bilateral pleural fluid collections, compatible with known history of  mediastinal/pulmonary abscess as seen on recent CT. Electronically Signed   By: Ozell Daring M.D.   On: 06/20/2024 17:27   CT CHEST W CONTRAST Result Date: 06/19/2024 EXAM: CT CHEST WITH CONTRAST 06/17/2024 02:18:14 PM TECHNIQUE: CT of the chest was performed with the administration of intravenous contrast (75mL iohexol  (OMNIPAQUE ) 300 MG/ML solution). Multiplanar reformatted images are provided for review. Automated exposure control, iterative reconstruction, and/or weight based adjustment of the mA/kV was utilized to reduce the radiation dose to as low as reasonably achievable. COMPARISON: CT of the chest dated 05/22/2024. CLINICAL HISTORY: Lung/mediastinal abscess. Mediastinal abscess; Retropharyngeal abscess; Pericarditis, fatigue; Neck mass, nonpulsatile; Lung/mediastinal abscess; Weight loss, 10 lbs; No difficulty swallowing or breathing; Not tender; SHOB, difficulty moving. FINDINGS: MEDIASTINUM: Heart and pericardium are unremarkable. The central airways are clear. There is a multi-septate paraesophageal fluid collection which measures approximately 4 x 3 x 8 cm. LYMPH NODES: No mediastinal, hilar or axillary lymphadenopathy. LUNGS AND PLEURA: There is mild-to-moderate paraseptal emphysema within the lung apices bilaterally. There is irregular reticulation present anterolaterally within the left upper lobe. There is a new wedge-shaped area of consolidation within the medial segment of the right middle lobe. There is also new consolidation present centrally within the base of the right lower lobe. There is probable compressive atelectasis present posterolaterally within the right lower lobe adjacent to a loculated pleural fluid collection which measures approximately 12 x 5 x 6 cm. There are smaller loculated fluid collections medial to the bases of the right middle and lower lobes. There is also probable loculated fluid collection present dependently in the base of the left pleural cavity which measures  approximately 9 x 3 x 6.5 cm. SOFT TISSUES/BONES: There is mild-to-moderate dextroscoliosis of the thoracic spine. There is no evidence of osteomyelitis. UPPER ABDOMEN: Limited images of the upper abdomen demonstrates no acute abnormality. IMPRESSION: 1. Multi-septate para-esophageal fluid collection measuring approximately 4 x 3 x 8 cm, which remains concerning for mediastinal abscess. . 2. New wedge-shaped area of consolidation within the medial segment of the right middle lobe and new consolidation  centrally within the base of the right lower lobe. 3. Probable compressive atelectasis in the right lower lobe adjacent to a loculated pleural fluid collection measuring approximately 12 x 5 x 6 cm. 4. Smaller loculated fluid collections medial to the bases of the right middle and lower lobes. 5. Probable loculated fluid collection in the base of the left pleural cavity measuring approximately 9 x 3 x 6.5 cm. Electronically signed by: evalene coho 06/19/2024 03:33 PM EDT RP Workstation: [id number]   CT SOFT TISSUE NECK W CONTRAST Result Date: 06/19/2024 EXAM: CT NECK WITH CONTRAST 06/17/2024 02:18:14 PM TECHNIQUE: CT of the neck was performed with the administration of intravenous contrast. Multiplanar reformatted images are provided for review. Automated exposure control, iterative reconstruction, and/or weight based adjustment of the mA/kV was utilized to reduce the radiation dose to as low as reasonably achievable. COMPARISON: CT of the neck dated 05/22/2024. CLINICAL HISTORY: Neck mass, nonpulsatile. Mediastinal abscess; Retropharyngeal abscess; Pericarditis, fatigue; Neck mass, nonpulsatile; Lung/mediastinal abscess; Dx'd 05/20/2024; Weight loss, 10 lbs; No difficulty swallowing or breathing; Not tender; SHOB, difficulty moving. FINDINGS: AERODIGESTIVE TRACT: The right supraglottic and retropharyngeal abscess noted on the previous study has resolved in the interim. There are dystrophic calcifications again  demonstrated within the left palatine tonsils. SALIVARY GLANDS: The parotid and submandibular glands are unremarkable. THYROID: Unremarkable. LYMPH NODES: There are few residual shotty lymph nodes but no abnormally enlarged or morphologically suspicious nodes. SOFT TISSUES: No residual inflammatory changes evident within the neck. BRAIN, ORBITS, SINUSES AND MASTOIDS: No acute abnormality. LUNGS AND MEDIASTINUM: No acute abnormality. BONES: No focal bone abnormality. IMPRESSION: 1. Resolution of right supraglottic and retropharyngeal abscess noted on the previous study. 2. No residual inflammatory changes evident within the neck. 3. Dystrophic calcifications within the left palatine tonsils. 4. Few residual shotty lymph nodes, but no abnormally enlarged or morphologically suspicious nodes. Electronically signed by: evalene coho 06/19/2024 03:21 PM EDT RP Workstation: [id number]   ECHOCARDIOGRAM LIMITED Result Date: 05/25/2024    ECHOCARDIOGRAM LIMITED REPORT   Patient Name:   Sue [name] Date of Exam: 05/25/2024 Medical Rec #:  [id number]       Height:       69.0 in Accession #:    [id number]      Weight:       170.0 lb Date of Birth:  1966/09/01        BSA:          1.928 m Patient Age:    58 years        BP:           123/78 mmHg Patient Gender: F               HR:           67 bpm. Exam Location:  Inpatient Procedure: Strain Analysis, Limited Echo and Limited Color Doppler (Both            Spectral and Color Flow Doppler were utilized during procedure). Indications:    Pericardial Effusion  History:        Patient has prior history of Echocardiogram examinations, most                 recent 05/23/2024. Risk Factors:Former Smoker. Breast Cancer.  Sonographer:    Logan Shove RDCS Referring Phys: [id number] TODD D MCDIARMID IMPRESSIONS  1. Left ventricular ejection fraction, by estimation, is 55 to 60%. The left ventricle has normal function. The average left ventricular global longitudinal strain is -19.0 %.  The  global longitudinal strain is normal.  2. Right ventricular systolic function is normal. The right ventricular size is normal.  3. Trivial pericardial effusion with no tamponade physiology.  4. The mitral valve is grossly normal. Trivial mitral valve regurgitation.  5. The aortic valve is tricuspid.  6. The inferior vena cava is normal in size with greater than 50% respiratory variability, suggesting right atrial pressure of 3 mmHg. FINDINGS  Left Ventricle: Left ventricular ejection fraction, by estimation, is 55 to 60%. The left ventricle has normal function. The average left ventricular global longitudinal strain is -19.0 %. Strain was performed and the global longitudinal strain is normal. There is no left ventricular hypertrophy. Right Ventricle: The right ventricular size is normal. No increase in right ventricular wall thickness. Right ventricular systolic function is normal. Pericardium: Trivial pericardial effusion with no tamponade physiology. Trivial pericardial effusion is present. Mitral Valve: The mitral valve is grossly normal. Trivial mitral valve regurgitation. Aortic Valve: The aortic valve is tricuspid. Venous: The inferior vena cava is normal in size with greater than 50% respiratory variability, suggesting right atrial pressure of 3 mmHg. LEFT VENTRICLE PLAX 2D LVIDd:         5.30 cm LVIDs:         3.60 cm 2D Longitudinal Strain LV PW:         0.60 cm 2D Strain GLS Avg:     -19.0 % LV IVS:        0.90 cm  IVC IVC diam: 2.10 cm LEFT ATRIUM         Index LA diam:    3.40 cm 1.76 cm/m   AORTA Ao Root diam: 2.90 cm Soyla Merck MD Electronically signed by Soyla Merck MD Signature Date/Time: 05/25/2024/11:32:33 AM    Final     Labs:  CBC: Recent Labs    06/21/24 0345 06/22/24 0334 06/23/24 0339 06/24/24 0459  WBC 12.0* 12.3* 11.3* 11.1*  HGB 8.9* 9.1* 8.8* 9.1*  HCT 26.4* 27.2* 26.4* 27.8*  PLT 536* 522* 492* 494*    COAGS: Recent Labs    06/20/24 1900  INR 1.1     BMP: Recent Labs    06/21/24 1026 06/22/24 0334 06/23/24 0339 06/24/24 0459  NA 134* 133* 134* 131*  K 3.7 3.1* 3.7 3.3*  CL 99 99 103 100  CO2 24 24 22 22   GLUCOSE 94 105* 92 98  BUN <5* <5* <5* <5*  CALCIUM 8.5* 8.3* 8.4* 8.3*  CREATININE 0.49 0.52 0.49 0.49  GFRNONAA >60 >60 >60 >60    LIVER FUNCTION TESTS: Recent Labs    05/22/24 1133 06/20/24 1641  BILITOT 1.0 0.5  AST 21 22  ALT 12 12  ALKPHOS 76 119  PROT 7.3 7.8  ALBUMIN 3.1* 2.3*    TUMOR MARKERS: No results for input(s): AFPTM, CEA, CA199, CHROMGRNA in the last 8760 hours.  Assessment and Plan:  Images reviewed by Dr. Vanice = Dominant pleural fluid collection that appears amenable to CT guided drain placement.  Risks and benefits of chest tube placement were discussed with the patient including bleeding, infection, damage to adjacent structures, malfunction of the tube requiring additional procedures and sepsis.  All of the patient's questions were answered, patient is agreeable to proceed. Consent signed and in chart.   Electronically Signed: SARI GORMAN LAMP, PA-C   06/24/2024, 10:07 AM      I spent a total of 40 Minutes  in face to face in clinical consultation, greater than 50% of  which was counseling/coordinating care for pigtail chest tube placement.

## 2024-06-24 NOTE — Progress Notes (Addendum)
 Daily Progress Note Intern Pager: [phone]  Patient name: Sue [name] Medical record number: [id number] Date of birth: 1966/05/31 Age: 58 y.o. Gender: female  Primary Care Provider: Rudy Carlin LABOR, MD Consultants: CT surgery , Infection Disease Code Status: Full Code  Pt Overview and Major Events to Date:  06/20/24 : Admitted for worsening mediastinal abscess   Assessment and Plan: Sue [name] is a 58 y.o. female presenting at [facility] ED after she was called regarding her CT Chest scan result concerning for worsening mediastinal abscess evidence by CTAB w/ contrast on 8/4 shows Multi-septate para-esophageal fluid collection measuring approximately 4 x 3 x 8 cm, which remains concerning for worsening mediastinal abscess.    Pertinent PMH/PSH includes.  Mediastinal abscess Breast cancer s/p radiation Pericarditis Assessment & Plan Mediastinal abscess with possible sepsis Loculated pleural effusion, bilateral Afebrile overnight and leukocytosis WBC 11.1 today from 11.3. Sputum culture shows klebsiella. Legionella antigen negative result - Appreciate ongoing CT surgery recommendations -  Plan for CT-guided drain placement for dominant pleural fluid collection today with radiology - Transition IV Zosyn  (8/7-8/11 ) to IV Ertapenem  1 g : may require prolonged course as mediastinal abscess not drainable per CTS - Continue daily CBC Malnutrition with unintended weight loss Protein-calorie malnutrition, severe She was able to tolerated regular diet yesterday states she ate 50% of her meal. Currently NPO for drain placement with radiology today - Appreciate ongoing RD recommendations - Monitoring for refeeding with daily Phos, BMP, mag - Continue to encourage p.o. intake as able.  Chronic health problem Breast cancer s/p radiation therapy  Anemia of neoplastic disease Emphysema Hyponatremia : Continue to monitor with Daily BMP   FEN/GI: NPO - pre-procedure PPx: Protonix  40 mg  IV Daily Dispo:Pending likely Home  Subjective:  Sue [name] is a 58 y.o. female with concerned for worsening mediastinal abscess. Patient states her appetite is improving and was able to eat 50% of her regular diet for dinner. She c/o productive cough. Denies pain, fever, or chills this morning   Objective: Temp:  [97.9 F (36.6 C)-99 F (37.2 C)] 97.9 F (36.6 C) (08/11 0727) Pulse Rate:  [90-103] 90 (08/11 0727) Resp:  [16-20] 16 (08/11 0727) BP: (95-111)/(62-73) 110/73 (08/11 0727) SpO2:  [96 %-100 %] 99 % (08/11 0727)  Physical Exam Constitutional:      Appearance: Normal appearance.  Cardiovascular:     Rate and Rhythm: Normal rate.     Pulses: Normal pulses.     Heart sounds: Normal heart sounds.  Pulmonary:     Effort: Pulmonary effort is normal.     Breath sounds: Examination of the right-lower field reveals decreased breath sounds. Examination of the left-lower field reveals decreased breath sounds. Decreased breath sounds present.  Abdominal:     Palpations: Abdomen is soft.  Skin:    General: Skin is warm and dry.     Capillary Refill: Capillary refill takes less than 2 seconds.  Neurological:     General: No focal deficit present.     Mental Status: She is alert and oriented to person, place, and time.  Psychiatric:        Mood and Affect: Mood normal.        Behavior: Behavior normal.        Thought Content: Thought content normal.        Judgment: Judgment normal.      Laboratory: Most recent CBC Lab Results  Component Value Date   WBC 11.1 (H)  06/24/2024   HGB 9.1 (L) 06/24/2024   HCT 27.8 (L) 06/24/2024   MCV 95.5 06/24/2024   PLT 494 (H) 06/24/2024   Most recent BMP    Latest Ref Rng & Units 06/24/2024    4:59 AM  BMP  Glucose 70 - 99 mg/dL 98   BUN 6 - 20 mg/dL <5   Creatinine 9.55 - 1.00 mg/dL 9.50   Sodium 864 - 854 mmol/L 131   Potassium 3.5 - 5.1 mmol/L 3.3   Chloride 98 - 111 mmol/L 100   CO2 22 - 32 mmol/L 22   Calcium 8.9 -  10.3 mg/dL 8.3     Imaging/Diagnostic Tests:  EXAM: PORTABLE CHEST 1 VIEW   COMPARISON:  05/22/2024, 06/17/2024 IMPRESSION: 1. Stable right basilar consolidation and complex multilocular bilateral pleural fluid collections, compatible with known history of mediastinal/pulmonary abscess as seen on recent CT.  EXAM:  CT NECK WITH CONTRAST  06/17/2024 02:18:14 PM  IMPRESSION: 1. Multi-septate para-esophageal fluid collection measuring approximately 4 x 3 x 8 cm, which remains concerning for mediastinal abscess. 2. New wedge-shaped area of consolidation within the medial segment of the right middle lobe and new consolidation centrally within the base of the right lower lobe. 3. Probable compressive atelectasis in the right lower lobe adjacent to a loculated pleural fluid collection measuring approximately 12 x 5 x 6 cm. 4. Smaller loculated fluid collections medial to the bases of the right middle and lower lobes. 5. Probable loculated fluid collection in the base of the left pleural cavity measuring approximately 9 x 3 x 6.5 cm.   EXAM: CT NECK WITH CONTRAST 06/17/2024 02:18:14 PM   IMPRESSION: 1. Resolution of right supraglottic and retropharyngeal abscess noted on the previous study. 2. No residual inflammatory changes evident within the neck. 3. Dystrophic calcifications within the left palatine tonsils. 4. Few residual shotty lymph nodes, but no abnormally enlarged or morphologically suspicious nodes.    [name], DO 06/24/2024, 9:02 AM  PGY-1, [facility] FPTS Intern pager: [phone], text pages welcome Secure chat group [facility] Teaching Service

## 2024-06-24 NOTE — Assessment & Plan Note (Signed)
 She was able to tolerated regular diet yesterday states she ate 50% of her meal. Currently NPO for drain placement with radiology today - Appreciate ongoing RD recommendations - Monitoring for refeeding with daily Phos, BMP, mag - Continue to encourage p.o. intake as able.

## 2024-06-24 NOTE — Assessment & Plan Note (Signed)
 Breast cancer s/p radiation therapy  Anemia of neoplastic disease Emphysema Hyponatremia : Continue to monitor with Daily BMP

## 2024-06-24 NOTE — Plan of Care (Signed)

## 2024-06-24 NOTE — Procedures (Signed)
 Interventional Radiology Procedure Note  Procedure: CT GUIDED RT CHEST 12 FR DRAIN    Complications: None  Estimated Blood Loss:  MIN  Findings: PURULENT ASP C/W EMYPEMA CX SENT    EMERSON FREDERIC SPECKING, MD

## 2024-06-24 NOTE — Plan of Care (Signed)

## 2024-06-25 ENCOUNTER — Other Ambulatory Visit: Payer: Self-pay

## 2024-06-25 ENCOUNTER — Inpatient Hospital Stay (HOSPITAL_COMMUNITY)

## 2024-06-25 DIAGNOSIS — B3889 Other forms of coccidioidomycosis: Secondary | ICD-10-CM

## 2024-06-25 DIAGNOSIS — J189 Pneumonia, unspecified organism: Secondary | ICD-10-CM

## 2024-06-25 DIAGNOSIS — Z978 Presence of other specified devices: Secondary | ICD-10-CM

## 2024-06-25 LAB — CULTURE, BLOOD (ROUTINE X 2)
Culture: NO GROWTH
Special Requests: ADEQUATE

## 2024-06-25 LAB — CBC WITH DIFFERENTIAL/PLATELET
Abs Immature Granulocytes: 0.04 K/uL (ref 0.00–0.07)
Basophils Absolute: 0 K/uL (ref 0.0–0.1)
Basophils Relative: 0 %
Eosinophils Absolute: 0.1 K/uL (ref 0.0–0.5)
Eosinophils Relative: 1 %
HCT: 28 % — ABNORMAL LOW (ref 36.0–46.0)
Hemoglobin: 9.2 g/dL — ABNORMAL LOW (ref 12.0–15.0)
Immature Granulocytes: 1 %
Lymphocytes Relative: 17 %
Lymphs Abs: 1.3 K/uL (ref 0.7–4.0)
MCH: 31.9 pg (ref 26.0–34.0)
MCHC: 32.9 g/dL (ref 30.0–36.0)
MCV: 97.2 fL (ref 80.0–100.0)
Monocytes Absolute: 0.8 K/uL (ref 0.1–1.0)
Monocytes Relative: 11 %
Neutro Abs: 5.3 K/uL (ref 1.7–7.7)
Neutrophils Relative %: 70 %
Platelets: 479 K/uL — ABNORMAL HIGH (ref 150–400)
RBC: 2.88 MIL/uL — ABNORMAL LOW (ref 3.87–5.11)
RDW: 14.4 % (ref 11.5–15.5)
WBC: 7.6 K/uL (ref 4.0–10.5)
nRBC: 0 % (ref 0.0–0.2)

## 2024-06-25 LAB — PHOSPHORUS: Phosphorus: 2.9 mg/dL (ref 2.5–4.6)

## 2024-06-25 LAB — BASIC METABOLIC PANEL WITH GFR
Anion gap: 7 (ref 5–15)
BUN: <5 mg/dL — ABNORMAL LOW (ref 6–20)
CO2: 22 mmol/L (ref 22–32)
Calcium: 8.1 mg/dL — ABNORMAL LOW (ref 8.9–10.3)
Chloride: 105 mmol/L (ref 98–111)
Creatinine, Ser: 0.43 mg/dL — ABNORMAL LOW (ref 0.44–1.00)
GFR, Estimated: >60 mL/min (ref >60)
Glucose, Bld: 88 mg/dL (ref 70–99)
Potassium: 4.2 mmol/L (ref 3.5–5.1)
Sodium: 134 mmol/L — ABNORMAL LOW (ref 135–145)

## 2024-06-25 LAB — MAGNESIUM: Magnesium: 2 mg/dL (ref 1.7–2.4)

## 2024-06-25 MED ORDER — PANTOPRAZOLE SODIUM 40 MG PO TBEC
40.0000 mg | DELAYED_RELEASE_TABLET | Freq: Every day | ORAL | Status: DC
  Administered 2024-06-26 – 2024-06-28 (×4): 40 mg via ORAL
  Filled 2024-06-25 (×3): qty 1

## 2024-06-25 NOTE — Plan of Care (Signed)

## 2024-06-25 NOTE — Progress Notes (Signed)
 PHARMACIST - PHYSICIAN COMMUNICATION  DR:   Donah  CONCERNING: IV to Oral Route Change Policy  RECOMMENDATION: This patient is receiving Protonix  by the intravenous route.  Based on criteria approved by the Pharmacy and Therapeutics Committee, the intravenous medication(s) is/are being converted to the equivalent oral dose form(s).   DESCRIPTION: These criteria include: The patient is eating (either orally or via tube) and/or has been taking other orally administered medications for a least 24 hours The patient has no evidence of active gastrointestinal bleeding or impaired GI absorption (gastrectomy, short bowel, patient on TNA or NPO).  If you have questions about this conversion, please contact the Pharmacy Department  []   [phone] )  [facility] []   [phone] )  [facility] []   [phone] )  [facility] []   [phone] )  [facility] []   [phone] )  Darryle Darra Bernerd Lionel   Prentice DOROTHA Favors, PharmD PGY1 Health-System Pharmacy Administration and Leadership Resident [facility]  06/25/2024 11:31 AM

## 2024-06-25 NOTE — Progress Notes (Signed)
 Daily Progress Note Intern Pager: [phone]  Patient name: Sue [name] Medical record number: [id number] Date of birth: 07/16/1966 Age: 58 y.o. Gender: female  Primary Care Provider: Rudy Carlin LABOR, MD Consultants: CT surgery , Infection Disease Code Status: Full Code  Pt Overview and Major Events to Date:  06/20/24 : Admitted for worsening mediastinal abscess   Assessment and Plan: Sue [name] is a 58 y.o. female presenting at [facility] ED after she was called regarding her CT Chest scan result concerning for worsening mediastinal abscess evidence by CTAB w/ contrast on 8/4 shows Multi-septate para-esophageal fluid collection measuring approximately 4 x 3 x 8 cm, which remains concerning for worsening mediastinal abscess.    Pertinent PMH/PSH includes.  Mediastinal abscess Breast cancer s/p radiation Pericarditis Assessment & Plan Mediastinal abscess with possible sepsis Loculated pleural effusion, bilateral Afebrile overnight and leukocytosis WBC 11.1 today from 11.3. Sputum culture shows klebsiella. Legionella antigen negative result. She is now s/p CT-guided chest tube  placement for dominant pleural fluid collection on 8/11 w/ radiology. She has Normal WBC this morning - Appreciate ongoing CT surgery recommendations - Transition IV Zosyn  (8/7-8/11 ) to IV Ertapenem  1 g : may require prolonged course as mediastinal abscess not drainable per CTS - Continue daily CBC Malnutrition with unintended weight loss Protein-calorie malnutrition, severe She was able to tolerated regular diet yesterday states she ate 50% of her meal. Currently NPO for drain placement with radiology today - Appreciate ongoing RD recommendations - Monitoring for refeeding with daily Phos, BMP, mag - Continue to encourage p.o. intake as able.  Chronic health problem Breast cancer s/p radiation therapy  Anemia of neoplastic disease Emphysema Hyponatremia : Continue to monitor with Daily BMP    FEN/GI: Regular diet PPx: Protonix  40 mg IV Daily Dispo:Pending likely Home  Subjective:  Sue [name] is a 58 y.o. female with concerned for worsening mediastinal abscess.  S/p 54F chest tube placed by radiology yesterday, currently on -20 low wall suction. No airleak this morning. She looks great this morning with normal WBC. Denies pain, fever, or chills this morning   Objective: Temp:  [97.8 F (36.6 C)-98.7 F (37.1 C)] 97.8 F (36.6 C) (08/12 0714) Pulse Rate:  [85-100] 94 (08/12 0714) Resp:  [15-35] 18 (08/12 0714) BP: (97-124)/(61-88) 116/82 (08/12 0714) SpO2:  [95 %-100 %] 96 % (08/12 0714)  Physical Exam Constitutional:      Appearance: Normal appearance.  Cardiovascular:     Rate and Rhythm: Normal rate.     Pulses: Normal pulses.     Heart sounds: Normal heart sounds.  Pulmonary:     Effort: Pulmonary effort is normal.     Breath sounds: Examination of the right-lower field reveals decreased breath sounds. Examination of the left-lower field reveals decreased breath sounds. Decreased breath sounds present.  Chest:     Chest wall: Tenderness (mild tenderness, Chest Tube to -20 sx) present.  Abdominal:     Palpations: Abdomen is soft.  Skin:    General: Skin is warm and dry.     Capillary Refill: Capillary refill takes less than 2 seconds.  Neurological:     General: No focal deficit present.     Mental Status: She is alert and oriented to person, place, and time.  Psychiatric:        Mood and Affect: Mood normal.        Behavior: Behavior normal.        Thought Content: Thought content normal.  Judgment: Judgment normal.      Laboratory: Most recent CBC Lab Results  Component Value Date   WBC 7.6 06/25/2024   HGB 9.2 (L) 06/25/2024   HCT 28.0 (L) 06/25/2024   MCV 97.2 06/25/2024   PLT 479 (H) 06/25/2024   Most recent BMP    Latest Ref Rng & Units 06/25/2024    3:44 AM  BMP  Glucose 70 - 99 mg/dL 88   BUN 6 - 20 mg/dL <5   Creatinine  9.55 - 1.00 mg/dL 9.56   Sodium 864 - 854 mmol/L 134   Potassium 3.5 - 5.1 mmol/L 4.2   Chloride 98 - 111 mmol/L 105   CO2 22 - 32 mmol/L 22   Calcium 8.9 - 10.3 mg/dL 8.1     Imaging/Diagnostic Tests:  EXAM: PORTABLE CHEST 1 VIEW   COMPARISON:  05/22/2024, 06/17/2024 IMPRESSION: 1. Stable right basilar consolidation and complex multilocular bilateral pleural fluid collections, compatible with known history of mediastinal/pulmonary abscess as seen on recent CT.  EXAM:  CT NECK WITH CONTRAST  06/17/2024 02:18:14 PM  IMPRESSION: 1. Multi-septate para-esophageal fluid collection measuring approximately 4 x 3 x 8 cm, which remains concerning for mediastinal abscess. 2. New wedge-shaped area of consolidation within the medial segment of the right middle lobe and new consolidation centrally within the base of the right lower lobe. 3. Probable compressive atelectasis in the right lower lobe adjacent to a loculated pleural fluid collection measuring approximately 12 x 5 x 6 cm. 4. Smaller loculated fluid collections medial to the bases of the right middle and lower lobes. 5. Probable loculated fluid collection in the base of the left pleural cavity measuring approximately 9 x 3 x 6.5 cm.   EXAM: CT NECK WITH CONTRAST 06/17/2024 02:18:14 PM   IMPRESSION: 1. Resolution of right supraglottic and retropharyngeal abscess noted on the previous study. 2. No residual inflammatory changes evident within the neck. 3. Dystrophic calcifications within the left palatine tonsils. 4. Few residual shotty lymph nodes, but no abnormally enlarged or morphologically suspicious nodes.    [name], DO 06/25/2024, 8:11 AM  PGY-1, [facility] FPTS Intern pager: [phone], text pages welcome Secure chat group [facility] Teaching Service

## 2024-06-25 NOTE — Assessment & Plan Note (Signed)
 Afebrile overnight and leukocytosis WBC 11.1 today from 11.3. Sputum culture shows klebsiella. Legionella antigen negative result. She is now s/p CT-guided chest tube  placement for dominant pleural fluid collection on 8/11 w/ radiology. She has Normal WBC this morning - Appreciate ongoing CT surgery recommendations - Transition IV Zosyn  (8/7-8/11 ) to IV Ertapenem  1 g : may require prolonged course as mediastinal abscess not drainable per CTS - Continue daily CBC

## 2024-06-25 NOTE — Progress Notes (Signed)
 [facility]    Date of Admission:  06/20/2024    Total days of antibiotics  2 days of Zosyn  8/9-8/10 Ertapenem  Day 2           ID: Sue [name] is a 58 y.o. female with  PMH of malnutrition, breast cancer  in 2015 s/p radiation, that presents for mediastinal abscess after resolution of retropharyngeal abscess and is s/p IV antibiotics and PO antibiotics.  Principal Problem:   Mediastinal abscess with possible sepsis Active Problems:   Abscess of supraglottic region   Infection   Leukocytosis   Chronic health problem   Hyponatremia   Loculated pleural effusion, bilateral   Unintentional weight loss of more than 10 pounds in 90 days   Malnutrition with unintended weight loss   Protein-calorie malnutrition, severe    Subjective: Patient was very cheerful this morning. She reports that after the chest tube was places yesterday, she now feels that she can breathe better with inhalation and exhalation. She also reports that she is able to eat now and taste a little bit as well of what she is eating. She had reported previously that she did not have much of an appetite prior to this due to her inability to taste many of the foods that she was eating. Patient denies any fever/chills this morning. No other complaints at this time.   Medications:   enoxaparin  (LOVENOX ) injection  40 mg Subcutaneous Q24H   [START ON 06/26/2024] pantoprazole   40 mg Oral Daily   polyethylene glycol  17 g Oral Daily   senna-docusate  1 tablet Oral QHS   sodium chloride  flush  10 mL Intrapleural Q8H    Objective: Vital signs in last 24 hours: Temp:  [97.8 F (36.6 C)-98.7 F (37.1 C)] 97.9 F (36.6 C) (08/12 1105) Pulse Rate:  [85-100] 99 (08/12 1105) Resp:  [16-20] 18 (08/12 1105) BP: (107-124)/(73-88) 117/82 (08/12 1105) SpO2:  [95 %-97 %] 97 % (08/12 1105)   Physical Exam Constitutional:      General: She is not in acute distress. Cardiovascular:     Rate and  Rhythm: Normal rate.     Heart sounds: No murmur heard. Pulmonary:     Effort: Pulmonary effort is normal. No respiratory distress.     Breath sounds: No wheezing.     Comments: Right sided tube with bandage in place and no signs of bleeding or discharge from chest tube site. No tenderness around the site. About 350 mL noted of thick yellow purulent fluid  Decreased breath sounds at bases posterior  bilaterally  Neurological:     Mental Status: She is alert.  Psychiatric:        Mood and Affect: Mood normal.      Lab Results Recent Labs    06/24/24 0459 06/25/24 0344  WBC 11.1* 7.6  HGB 9.1* 9.2*  HCT 27.8* 28.0*  NA 131* 134*  K 3.3* 4.2  CL 100 105  CO2 22 22  BUN <5* <5*  CREATININE 0.49 0.43*   Liver Panel No results for input(s): PROT, ALBUMIN, AST, ALT, ALKPHOS, BILITOT, BILIDIR, IBILI in the last 72 hours. Sedimentation Rate No results for input(s): ESRSEDRATE in the last 72 hours. C-Reactive Protein No results for input(s): CRP in the last 72 hours.  Microbiology:  Studies/Results: DG Chest Port 1 View Result Date: 06/25/2024 CLINICAL DATA:  Empyema with chest tube in place EXAM: PORTABLE CHEST 1 VIEW COMPARISON:  Chest x-ray 06/20/2024 FINDINGS:  Pigtail thoracostomy tube present within the lateral base of the right pleural space. Decreased loculated pleural effusion. Persistent atelectasis. No pneumothorax. Trace left pleural effusion is similar. Cardiac and mediastinal contours are unchanged. IMPRESSION: Well-positioned right basilar chest tube with decreased loculated right-sided pleural effusion. Small layering left pleural effusion with associated atelectasis. Electronically Signed   By: Wilkie Lent M.D.   On: 06/25/2024 12:49   CT [facility] PLEURAL DRAIN W/INDWELL CATH W/IMG GUIDE Result Date: 06/24/2024 INDICATION: [id number] Pleural abscess (HCC) [id number] EXAM: CT-GUIDED 12 FRENCH RIGHT CHEST EMPYEMA DRAIN MEDICATIONS: The patient is currently  admitted to the hospital and receiving intravenous antibiotics. The antibiotics were administered within an appropriate time frame prior to the initiation of the procedure. ANESTHESIA/SEDATION: Moderate (conscious) sedation was employed during this procedure. A total of Versed  1.0 mg and Fentanyl  25 mcg was administered intravenously by the radiology nurse. Total intra-service moderate Sedation Time: 12 minutes. The patient's level of consciousness and vital signs were monitored continuously by radiology nursing throughout the procedure under my direct supervision. COMPLICATIONS: None immediate. PROCEDURE: Informed written consent was obtained from the patient after a thorough discussion of the procedural risks, benefits and alternatives. All questions were addressed. Maximal Sterile Barrier Technique was utilized including caps, mask, sterile gowns, sterile gloves, sterile drape, hand hygiene and skin antiseptic. A timeout was performed prior to the initiation of the procedure. previous imaging reviewed. patient positioned slightly right anterior oblique. noncontrast localization CT performed. the loculated right posterolateral chest empyema was localized and marked for access. under sterile conditions and local anesthesia, an 18 gauge 10 cm access needle was advanced percutaneously into the fluid collection. needle position confirmed with CT. syringe aspiration yielded purulent fluid. sample sent for culture. guidewire inserted followed by tract dilatation to insert a 12 french drain. drain catheter position confirmed with CT. Catheter secured with a silk suture and a sterile dressing. External pleura vac attached. No immediate complication. Patient tolerated the procedure well. IMPRESSION: Successful CT-guided 12 French right chest tube insertion for empyema Electronically Signed   By: CHRISTELLA.  Shick M.D.   On: 06/24/2024 13:38     Assessment/Plan: Sue [name] is a 58 y.o. female with  PMH of malnutrition,  breast cancer  in 2015, that presents for mediastinal abscess after resolution of retropharyngeal abscess and is s/p IV antibiotics and PO antibiotics. She is admitted for further IV antibiotics and surgical management options to treat mediastinal abscess  #Mediastinal abscess with possible sepsis #s/p POD 1 CT guided chest tube placement #Pneumonia with consolidation in right middle and lower lobe #empyema Patient reports that she is feeling much better today and that she is able to breathe better and is able to taste better this morning. She is saturating well on room air, hemodynamically stable and is afebrile. About 350 mL of thick purulent drainage noted at bedside form chest drain.  - Gram stain from 8/11 shows moderate WBC, predominantly PMN, with rare gram positive cocci and rare gram negative rods.  - CT chest w/contrast on 8/4 showed loculated pleural fluid collection on the right lower lobe and right middle lobe and left pleural cavity.Also showed new wedge shaped area of consolidation within medial segment of right middle lobe and new consolidation within base of right lower lobe. Multi septate para esophageal fluid collection consistent with mediastinal abscess.  - CT soft tissue neck  on 8/4 showed resolution of right supraglottic and retropharyngeal abscess.  - Sputum culture on 8/8 with gram stain shows gram positive cocci  pairs in chains with few gram variable rods and cultures grew few klebsiella aerogenes. - Blood cultures show NGTDx5 days - Leukocytosis resolved and stable at 7.6 today.   Plan:  - Continue Ertapenem  IV Day 2. Patient will likely need 2-3 weeks of IV antibiotics. Will need PICC line placed. Once a day administration will hopefully make it easier for patient to use at home. - symptom improvement, hemodynamically stable, afebrile are all reassuring signs.  - Continue to follow fever curve and monitor respiratory status   [facility] Pager: [phone]  06/25/2024, 3:50 PM

## 2024-06-25 NOTE — Progress Notes (Signed)
 Consent obtained after discussing risks, benefits, and alternatives. Patient requested waiting until 8-13 AM for placement. Primary RN notified.

## 2024-06-25 NOTE — Assessment & Plan Note (Signed)
 She was able to tolerated regular diet yesterday states she ate 50% of her meal. Currently NPO for drain placement with radiology today - Appreciate ongoing RD recommendations - Monitoring for refeeding with daily Phos, BMP, mag - Continue to encourage p.o. intake as able.

## 2024-06-25 NOTE — Assessment & Plan Note (Signed)
 Breast cancer s/p radiation therapy  Anemia of neoplastic disease Emphysema Hyponatremia : Continue to monitor with Daily BMP

## 2024-06-25 NOTE — Progress Notes (Signed)
 PHARMACY CONSULT NOTE FOR:  OUTPATIENT  PARENTERAL ANTIBIOTIC THERAPY (OPAT)  Indication: Mediastinal/pleural abscess Regimen: Ertapenem  1g IV every 24 hours End date: 07/15/24 (3 weeks from 06/24/24)  IV antibiotic discharge orders are pended. To discharging provider:  please sign these orders via discharge navigator,  Select New Orders & click on the button choice - Manage This Unsigned Work.     Thank you for allowing pharmacy to be a part of this patient's care.  Almarie Lunger, PharmD, BCPS, BCIDP Infectious Diseases Clinical Pharmacist 06/28/2024 9:10 AM   **Pharmacist phone directory can now be found on [email or web address] ([email or web address]).  Listed under [facility].

## 2024-06-25 NOTE — Plan of Care (Signed)

## 2024-06-25 NOTE — Progress Notes (Addendum)
 Transition of Care [facility]) - Inpatient Brief Assessment   Patient Details  Name: LAMYRA MALCOLM MRN: [id number] Date of Birth: 1966-10-21  [facility] CM/SW Contact:    Rosaline JONELLE Joe, RN Phone Number: 06/25/2024, 4:26 PM   Clinical Narrative: CM with IP Care management met with the patient at the bedside.  Patient lives at home with her spouse and adult daughter.  Patient has no DME at home.  Patient is independent.  Patient has Right sided Chest tube at this time.  Plan is for Ct to be removed once drainage has decreased.  ID MD is following the patient at this time as well.    Holley Herring, RNCM with [employer] was updated that patient was transferred to [address].  Holley Herring, [facility] is following the patient for IV needs for IV Ertapenum.  CM with IP Care management will continue to follow the patient for needs to return home once CT removed.   Transition of Care Asessment: Insurance and Status: (P) Insurance coverage has been reviewed Patient has primary care physician: (P) Yes Home environment has been reviewed: (P) from home with spouse and daughter Prior level of function:: (P) self Prior/Current Home Services: (P) No current home services Social Drivers of Health Review: (P) SDOH reviewed no interventions necessary Readmission risk has been reviewed: (P) Yes Transition of care needs: (P) no transition of care needs at this time

## 2024-06-25 NOTE — Progress Notes (Signed)
      [address]       [name] [address]             [phone]        Subjective: Patient sitting up in bed reports she is no longer taking pain medication and her food actually tastes like food now. Much better affect than when I saw her on Friday.  Objective: Vital signs in last 24 hours: Temp:  [97.8 F (36.6 C)-98.7 F (37.1 C)] 97.9 F (36.6 C) (08/12 1105) Pulse Rate:  [85-100] 99 (08/12 1105) Cardiac Rhythm: Normal sinus rhythm (08/11 2010) Resp:  [16-30] 18 (08/12 1105) BP: (97-124)/(65-88) 117/82 (08/12 1105) SpO2:  [95 %-100 %] 97 % (08/12 1105)  Hemodynamic parameters for last 24 hours:    Intake/Output from previous day: 08/11 0701 - 08/12 0700 In: -  Out: 900 [Urine:550; Drains:350] Intake/Output this shift: No intake/output data recorded.  General appearance: alert, cooperative, and no distress Neurologic: intact Heart: regular rate and rhythm-sinus tachycardia Lungs: slightly diminished bibasilar breath sounds Wound: Clean and dry dressing in place, pleurevac with mostly milky purulent fluid but now looks more serous  Lab Results: Recent Labs    06/24/24 0459 06/25/24 0344  WBC 11.1* 7.6  HGB 9.1* 9.2*  HCT 27.8* 28.0*  PLT 494* 479*   BMET:  Recent Labs    06/24/24 0459 06/25/24 0344  NA 131* 134*  K 3.3* 4.2  CL 100 105  CO2 22 22  GLUCOSE 98 88  BUN <5* <5*  CREATININE 0.49 0.43*  CALCIUM 8.3* 8.1*    PT/INR: No results for input(s): LABPROT, INR in the last 72 hours. ABG No results found for: PHART, HCO3, TCO2, ACIDBASEDEF, O2SAT CBG (last 3)  No results for input(s): GLUCAP in the last 72 hours.  Assessment/Plan: S/P right sided CT guided chest tube placement by IR  CV: Stable vital signs  Pulm: Chest tube in place to suction. CT output 350cc/24hrs recorded, now around 400cc in the pleurevac. Mostly milky purulent fluid but drainage has slowed and it looks more serous now. No air leak.  CXR with improved loculated right basilar pleural effusion, no pneumothorax. Will leave chest tube in place to suction for now due to the amount of drainage. Will likely d/c chest tube once drainage slows.  ID: Abscess culture with rare gram positive cocci and rare gram negative rods, has been reincubated for better growth. ID following, on IV ertapenem   Dispo: Chest tube to remain to suction for now, right loculated pleural effusion improved. Will follow to determine when chest tube can be discontinued.    LOS: 5 days    Con GORMAN Bend, PA-C 06/25/2024

## 2024-06-26 LAB — CBC WITH DIFFERENTIAL/PLATELET
Abs Immature Granulocytes: 0.03 K/uL (ref 0.00–0.07)
Basophils Absolute: 0 K/uL (ref 0.0–0.1)
Basophils Relative: 0 %
Eosinophils Absolute: 0.1 K/uL (ref 0.0–0.5)
Eosinophils Relative: 1 %
HCT: 26.9 % — ABNORMAL LOW (ref 36.0–46.0)
Hemoglobin: 8.7 g/dL — ABNORMAL LOW (ref 12.0–15.0)
Immature Granulocytes: 0 %
Lymphocytes Relative: 18 %
Lymphs Abs: 1.3 K/uL (ref 0.7–4.0)
MCH: 31.3 pg (ref 26.0–34.0)
MCHC: 32.3 g/dL (ref 30.0–36.0)
MCV: 96.8 fL (ref 80.0–100.0)
Monocytes Absolute: 0.8 K/uL (ref 0.1–1.0)
Monocytes Relative: 12 %
Neutro Abs: 4.8 K/uL (ref 1.7–7.7)
Neutrophils Relative %: 69 %
Platelets: 464 K/uL — ABNORMAL HIGH (ref 150–400)
RBC: 2.78 MIL/uL — ABNORMAL LOW (ref 3.87–5.11)
RDW: 14.4 % (ref 11.5–15.5)
WBC: 7 K/uL (ref 4.0–10.5)
nRBC: 0 % (ref 0.0–0.2)

## 2024-06-26 LAB — BASIC METABOLIC PANEL WITH GFR
Anion gap: 13 (ref 5–15)
BUN: 5 mg/dL — ABNORMAL LOW (ref 6–20)
CO2: 21 mmol/L — ABNORMAL LOW (ref 22–32)
Calcium: 8.7 mg/dL — ABNORMAL LOW (ref 8.9–10.3)
Chloride: 102 mmol/L (ref 98–111)
Creatinine, Ser: 0.52 mg/dL (ref 0.44–1.00)
GFR, Estimated: >60 mL/min (ref >60)
Glucose, Bld: 75 mg/dL (ref 70–99)
Potassium: 3.6 mmol/L (ref 3.5–5.1)
Sodium: 136 mmol/L (ref 135–145)

## 2024-06-26 LAB — MAGNESIUM: Magnesium: 1.9 mg/dL (ref 1.7–2.4)

## 2024-06-26 LAB — PHOSPHORUS: Phosphorus: 3.4 mg/dL (ref 2.5–4.6)

## 2024-06-26 MED ORDER — CHLORHEXIDINE GLUCONATE CLOTH 2 % EX PADS
6.0000 | MEDICATED_PAD | Freq: Every day | CUTANEOUS | Status: DC
  Administered 2024-06-26 – 2024-06-27 (×3): 6 via TOPICAL

## 2024-06-26 MED ORDER — SODIUM CHLORIDE 0.9% FLUSH
10.0000 mL | INTRAVENOUS | Status: DC | PRN
  Administered 2024-06-27: 10 mL

## 2024-06-26 MED ORDER — SODIUM CHLORIDE 0.9% FLUSH
10.0000 mL | Freq: Two times a day (BID) | INTRAVENOUS | Status: DC
  Administered 2024-06-26 – 2024-06-28 (×6): 10 mL

## 2024-06-26 MED ORDER — ENSURE PLUS HIGH PROTEIN PO LIQD: 237.0000 mL | Freq: Two times a day (BID) | ORAL | Status: DC

## 2024-06-26 MED ORDER — MELATONIN 3 MG PO TABS
3.0000 mg | ORAL_TABLET | Freq: Every day | ORAL | Status: DC
  Administered 2024-06-26 – 2024-06-27 (×3): 3 mg via ORAL
  Filled 2024-06-26 (×2): qty 1

## 2024-06-26 MED ORDER — POTASSIUM CHLORIDE CRYS ER 20 MEQ PO TBCR
40.0000 meq | EXTENDED_RELEASE_TABLET | Freq: Once | ORAL | Status: AC
  Administered 2024-06-26 (×2): 40 meq via ORAL
  Filled 2024-06-26: qty 2

## 2024-06-26 MED ORDER — ADULT MULTIVITAMIN W/MINERALS CH
1.0000 | ORAL_TABLET | Freq: Every day | ORAL | Status: DC
  Administered 2024-06-26 – 2024-06-28 (×4): 1 via ORAL
  Filled 2024-06-26 (×3): qty 1

## 2024-06-26 NOTE — Progress Notes (Addendum)
      [address] [employer] [address]             [phone]         Subjective: Patient reports she is okay but she had a rough night  Objective: Vital signs in last 24 hours: Temp:  [97.6 F (36.4 C)-98.5 F (36.9 C)] 97.6 F (36.4 C) (08/13 0731) Pulse Rate:  [93-101] 93 (08/13 0731) Resp:  [18] 18 (08/12 1105) BP: (100-124)/(36-82) 100/73 (08/13 0731) SpO2:  [97 %-100 %] 99 % (08/13 0731)  Hemodynamic parameters for last 24 hours:    Intake/Output from previous day: 08/12 0701 - 08/13 0700 In: 213 [IV Piggyback:200] Out: 20 [Drains:20] Intake/Output this shift: No intake/output data recorded.  General appearance: alert, cooperative, and no distress Neurologic: intact Heart: sinus tachycardia, no murmur Lungs: clear to auscultation bilaterally Wound: Clean and dry dressing in place  Lab Results: Recent Labs    06/25/24 0344 06/26/24 0153  WBC 7.6 7.0  HGB 9.2* 8.7*  HCT 28.0* 26.9*  PLT 479* 464*   BMET:  Recent Labs    06/25/24 0344 06/26/24 0153  NA 134* 136  K 4.2 3.6  CL 105 102  CO2 22 21*  GLUCOSE 88 75  BUN <5* 5*  CREATININE 0.43* 0.52  CALCIUM 8.1* 8.7*    PT/INR: No results for input(s): LABPROT, INR in the last 72 hours. ABG No results found for: PHART, HCO3, TCO2, ACIDBASEDEF, O2SAT CBG (last 3)  No results for input(s): GLUCAP in the last 72 hours.  Assessment/Plan: S/P  CV: Stable vital signs   Pulm: Saturating well on RA. Chest tube in place to suction. CT output 20cc/24hrs recorded. Mostly milky purulent fluid. No air leak. CXR yesterday with improved loculated right basilar pleural effusion, no pneumothorax. Chest tube output is less than 150cc, may be able to d/c today. Will add incentive spirometer.    ID: Leukocytosis resolved and afebrile. Abscess culture with rare gram positive cocci and rare gram negative rods, has been reincubated for better growth. ID following, on IV  ertapenem    Dispo: Possibly d/c chest tube tube today since drainage is less than 150cc. IR to remove per Dr. Shyrl.   LOS: 6 days    Con GORMAN Bend, PA-C 06/26/2024

## 2024-06-26 NOTE — Assessment & Plan Note (Signed)
 Breast cancer s/p radiation therapy  Anemia of neoplastic disease Emphysema Hyponatremia : Continue to monitor with Daily BMP

## 2024-06-26 NOTE — TOC Progression Note (Signed)
 Transition of Care [facility]) - Progression Note    Patient Details  Name: Sue [name] MRN: [id number] Date of Birth: July 20, 1966  [facility] CM/SW Contact  Rosaline JONELLE Joe, RN Phone Number: 06/26/2024, 4:33 PM  Clinical Narrative:    CM met with the patient at the bedside.  Patient has Right CT that will likely be discontinued possibly today/tomorrow per Dr. Shyrl per note.  Patient has right arm PICC line at this time.    I updated Holley Herring, RNCM that patient vira will need teaching/ IV antibiotics for home.  Patient was provided choice regarding home health and she did not have a preference.  Patient was agreeable for hospital follow up since she does not have PCP.  CM will continue to follow for needs for home with antibiotics IV.                     Expected Discharge Plan and Services                                               Social Drivers of Health (SDOH) Interventions SDOH Screenings   Food Insecurity: No Food Insecurity (06/25/2024)  Housing: Low Risk  (06/25/2024)  Transportation Needs: No Transportation Needs (06/25/2024)  Utilities: Not At Risk (06/25/2024)  Depression (PHQ2-9): Low Risk  (10/10/2023)  Social Connections: Unknown (03/29/2022)   Received from [facility]  Tobacco Use: Medium Risk (06/24/2024)    Readmission Risk Interventions    06/25/2024    4:26 PM  Readmission Risk Prevention Plan  Post Dischage Appt Complete  Medication Screening Complete  Transportation Screening Complete

## 2024-06-26 NOTE — TOC Progression Note (Signed)
 Transition of Care [facility]) - Progression Note    Patient Details  Name: Sue [name] MRN: [id number] Date of Birth: 1965/12/10  Transition of Care [facility]) CM/SW Contact  Rosaline JONELLE Joe, RN Phone Number: 06/26/2024, 4:33 PM  Clinical Narrative:    CM met with the patient at the bedside.  Patient has Right CT that will likely be discontinued possibly today/tomorrow per Dr. Shyrl per note.  Patient has right arm PICC line at this time.    I updated Holley Herring, RNCM that patient vira will need teaching/ IV antibiotics for home.  Patient was provided choice regarding home health and she did not have a preference.  Patient was agreeable for hospital follow up since she does not have PCP.  CM will continue to follow for needs for home with antibiotics IV.                     Expected Discharge Plan and Services                                               Social Drivers of Health (SDOH) Interventions SDOH Screenings   Food Insecurity: No Food Insecurity (06/25/2024)  Housing: Low Risk  (06/25/2024)  Transportation Needs: No Transportation Needs (06/25/2024)  Utilities: Not At Risk (06/25/2024)  Depression (PHQ2-9): Low Risk  (10/10/2023)  Social Connections: Unknown (03/29/2022)   Received from [facility]  Tobacco Use: Medium Risk (06/24/2024)    Readmission Risk Interventions    06/25/2024    4:26 PM  Readmission Risk Prevention Plan  Post Dischage Appt Complete  Medication Screening Complete  Transportation Screening Complete

## 2024-06-26 NOTE — Progress Notes (Signed)
 Daily Progress Note Intern Pager: [phone]  Patient name: Sue [name] Medical record number: [id number] Date of birth: 05-22-1966 Age: 58 y.o. Gender: female  Primary Care Provider: Rudy Carlin LABOR, MD Consultants: CT surgery , Infection Disease Code Status: Full Code  Pt Overview and Major Events to Date:  06/20/24 : Admitted for worsening mediastinal abscess   Assessment and Plan: Sue [name] is a 58 y.o. female presenting at [facility] ED after she was called regarding her CT Chest scan result concerning for worsening mediastinal abscess evidence by CTAB w/ contrast on 8/4 shows Multi-septate para-esophageal fluid collection measuring approximately 4 x 3 x 8 cm, which remains concerning for worsening mediastinal abscess.    Pertinent PMH/PSH includes.  Mediastinal abscess Breast cancer s/p radiation Pericarditis Assessment & Plan Mediastinal abscess with possible sepsis Loculated pleural effusion, bilateral Afebrile overnight and leukocytosis WBC 11.1 today from 11.3. Sputum culture shows klebsiella. Legionella antigen negative result. She is now s/p CT-guided chest tube  placement for dominant pleural fluid collection on 8/11 w/ radiology. She has Normal WBC this morning. Chest Tube output 20 ml last 24 hrs - possible be removed by IR today. - Appreciate ongoing CT surgery recommendations - Continue IV Ertapenem  1 g  starting ( 06/24/24 - ) :Transition from IV Zosyn  (8/7-8/11 ) Patient will likely need 2-3 weeks of IV antibiotics.  - Plan for PICC line placed today for Home IV ABX - Encourage OOB, ambulation, IS Malnutrition with unintended weight loss Protein-calorie malnutrition, severe She was able to tolerated regular diet yesterday states she ate 50% of her meal. Currently NPO for drain placement with radiology today - Appreciate ongoing RD recommendations - Monitoring for refeeding with daily Phos, BMP, mag - Continue to encourage p.o. intake as able.  Chronic health  problem Breast cancer s/p radiation therapy  Anemia of neoplastic disease Emphysema Hyponatremia : Continue to monitor with Daily BMP   FEN/GI: Regular diet PPx: Protonix  40 mg IV Daily Dispo:Pending likely Home  Subjective:  Sue [name] is a 58 y.o. female with concerned for worsening mediastinal abscess.  S/p 45F chest tube -20 low wall suction this morning. No air leak. Pt states she did not sleep well OVN otherwise she looks great this morning with normal WBC. Denies pain, fever, or chills this morning   Objective: Temp:  [97.6 F (36.4 C)-98.5 F (36.9 C)] 97.6 F (36.4 C) (08/13 0731) Pulse Rate:  [93-101] 93 (08/13 0731) Resp:  [18] 18 (08/12 1105) BP: (100-124)/(36-82) 100/73 (08/13 0731) SpO2:  [97 %-100 %] 99 % (08/13 0731)  Physical Exam Constitutional:      Appearance: Normal appearance.  Cardiovascular:     Rate and Rhythm: Normal rate.     Pulses: Normal pulses.     Heart sounds: Normal heart sounds.  Pulmonary:     Effort: Pulmonary effort is normal.     Breath sounds: Examination of the right-lower field reveals decreased breath sounds. Examination of the left-lower field reveals decreased breath sounds. Decreased breath sounds present.  Chest:     Chest wall: Tenderness (mild tenderness, Chest Tube to -20 sx) present.  Abdominal:     Palpations: Abdomen is soft.  Skin:    General: Skin is warm and dry.     Capillary Refill: Capillary refill takes less than 2 seconds.  Neurological:     General: No focal deficit present.     Mental Status: She is alert and oriented to person, place, and time.  Psychiatric:        Mood and Affect: Mood normal.        Behavior: Behavior normal.        Thought Content: Thought content normal.        Judgment: Judgment normal.      Laboratory: Most recent CBC Lab Results  Component Value Date   WBC 7.0 06/26/2024   HGB 8.7 (L) 06/26/2024   HCT 26.9 (L) 06/26/2024   MCV 96.8 06/26/2024   PLT 464 (H) 06/26/2024    Most recent BMP    Latest Ref Rng & Units 06/26/2024    1:53 AM  BMP  Glucose 70 - 99 mg/dL 75   BUN 6 - 20 mg/dL 5   Creatinine 9.55 - 8.99 mg/dL 9.47   Sodium 864 - 854 mmol/L 136   Potassium 3.5 - 5.1 mmol/L 3.6   Chloride 98 - 111 mmol/L 102   CO2 22 - 32 mmol/L 21   Calcium 8.9 - 10.3 mg/dL 8.7     Imaging/Diagnostic Tests:  Narrative & Impression CLINICAL DATA:  Empyema with chest tube in place   EXAM: PORTABLE CHEST 1 VIEW  IMPRESSION: Well-positioned right basilar chest tube with decreased loculated right-sided pleural effusion.   Small layering left pleural effusion with associated atelectasis.    [name], DO 06/26/2024, 8:39 AM  PGY-1, [facility] Family Medicine FPTS Intern pager: [phone], text pages welcome Secure chat group [facility] Teaching Service

## 2024-06-26 NOTE — Plan of Care (Signed)

## 2024-06-26 NOTE — Progress Notes (Signed)
 Chest tube output for night shift was 20 ml.

## 2024-06-26 NOTE — Assessment & Plan Note (Signed)
 Afebrile overnight and leukocytosis WBC 11.1 today from 11.3. Sputum culture shows klebsiella. Legionella antigen negative result. She is now s/p CT-guided chest tube  placement for dominant pleural fluid collection on 8/11 w/ radiology. She has Normal WBC this morning. Chest Tube output 20 ml last 24 hrs - possible be removed by IR today. - Appreciate ongoing CT surgery recommendations - Continue IV Ertapenem  1 g  starting ( 06/24/24 - ) :Transition from IV Zosyn  (8/7-8/11 ) Patient will likely need 2-3 weeks of IV antibiotics.  - Plan for PICC line placed today for Home IV ABX - Encourage OOB, ambulation, IS

## 2024-06-26 NOTE — Assessment & Plan Note (Signed)
 She was able to tolerated regular diet yesterday states she ate 50% of her meal. Currently NPO for drain placement with radiology today - Appreciate ongoing RD recommendations - Monitoring for refeeding with daily Phos, BMP, mag - Continue to encourage p.o. intake as able.

## 2024-06-26 NOTE — Progress Notes (Signed)
 Referring Physician(s): Dr. Shyrl  Supervising Physician: Jennefer Rover  Patient Status:  [name] - In-pt  Chief Complaint: Empyema  Subjective: Resting comfortably.  Room warm, states she is getting hot flashes which is normal for her.  R chest tube remains in place.  Productive, weak cough.   Allergies: Patient has no known allergies.  Medications: Prior to Admission medications   Medication Sig Start Date End Date Taking? Authorizing Provider  acetaminophen  (TYLENOL ) 500 MG tablet Take 1,500 mg by mouth as needed for mild pain (pain score 1-3) or moderate pain (pain score 4-6).   Yes [provider]  amoxicillin -clavulanate (AUGMENTIN ) 875-125 MG tablet Take 1 tablet by mouth 2 (two) times daily. 06/13/24  Yes Fleeta Rothman, Jomarie SAILOR, MD  colchicine  0.6 MG tablet Take 1 tablet (0.6 mg total) by mouth 2 (two) times daily. Patient not taking: Reported on 06/20/2024 05/25/24   Shitarev, Dimitry, MD  ibuprofen  (ADVIL ) 800 MG tablet Take 1 tablet (800 mg total) by mouth every 8 (eight) hours as needed (pain). Take with food to avoid stomach upset. Do not take any additional NSAIDs while on this. You may take tylenol  in addition to this if needed for extra pain relief. Patient not taking: Reported on 06/20/2024 05/20/24   Murrill, Samantha, FNP  megestrol  (MEGACE ) 40 MG tablet Take 1 tablet (40 mg total) by mouth 2 (two) times daily. Patient not taking: Reported on 06/20/2024 06/19/24   Fleeta Rothman, Jomarie SAILOR, MD  oxyCODONE  (OXY IR/ROXICODONE ) 5 MG immediate release tablet Take 0.5 tablets (2.5 mg total) by mouth every 4 (four) hours as needed for up to 10 doses for severe pain (pain score 7-10). Patient not taking: No sig reported 05/25/24   Toma, Dimitry, MD     Vital Signs: BP 120/80 (BP Location: Right Leg)   Pulse 100   Temp 98.2 F (36.8 C) (Oral)   Resp 17   Ht 5' 9 (1.753 m)   Wt 154 lb (69.9 kg)   LMP 10/24/2018   SpO2 99%   BMI 22.74 kg/m   Physical  Exam Vitals and nursing note reviewed.  Constitutional:      Appearance: Normal appearance.  Neurological:     Mental Status: She is alert.   Chest: Course breath sounds bilaterally, weak but productive cough. R chest tube in place.  Cloudy, purulent-appearing output in tubing and evac container.  To wall suction.   Imaging: US  EKG SITE RITE Result Date: 06/25/2024 If Site Rite image not attached, placement could not be confirmed due to current cardiac rhythm.  DG Chest Port 1 View Result Date: 06/25/2024 CLINICAL DATA:  Empyema with chest tube in place EXAM: PORTABLE CHEST 1 VIEW COMPARISON:  Chest x-ray 06/20/2024 FINDINGS: Pigtail thoracostomy tube present within the lateral base of the right pleural space. Decreased loculated pleural effusion. Persistent atelectasis. No pneumothorax. Trace left pleural effusion is similar. Cardiac and mediastinal contours are unchanged. IMPRESSION: Well-positioned right basilar chest tube with decreased loculated right-sided pleural effusion. Small layering left pleural effusion with associated atelectasis. Electronically Signed   By: Wilkie Lent M.D.   On: 06/25/2024 12:49   CT [name] PLEURAL DRAIN W/INDWELL CATH W/IMG GUIDE Result Date: 06/24/2024 INDICATION: [id number] Pleural abscess (HCC) [id number] EXAM: CT-GUIDED 12 FRENCH RIGHT CHEST EMPYEMA DRAIN MEDICATIONS: The patient is currently admitted to the [name] and receiving intravenous antibiotics. The antibiotics were administered within an appropriate time frame prior to the initiation of the procedure. ANESTHESIA/SEDATION: Moderate (conscious) sedation was employed during  this procedure. A total of Versed  1.0 mg and Fentanyl  25 mcg was administered intravenously by the radiology nurse. Total intra-service moderate Sedation Time: 12 minutes. The patient's level of consciousness and vital signs were monitored continuously by radiology nursing throughout the procedure under my direct supervision.  COMPLICATIONS: None immediate. PROCEDURE: Informed written consent was obtained from the patient after a thorough discussion of the procedural risks, benefits and alternatives. All questions were addressed. Maximal Sterile Barrier Technique was utilized including caps, mask, sterile gowns, sterile gloves, sterile drape, hand hygiene and skin antiseptic. A timeout was performed prior to the initiation of the procedure. previous imaging reviewed. patient positioned slightly right anterior oblique. noncontrast localization CT performed. the loculated right posterolateral chest empyema was localized and marked for access. under sterile conditions and local anesthesia, an 18 gauge 10 cm access needle was advanced percutaneously into the fluid collection. needle position confirmed with CT. syringe aspiration yielded purulent fluid. sample sent for culture. guidewire inserted followed by tract dilatation to insert a 12 french drain. drain catheter position confirmed with CT. Catheter secured with a silk suture and a sterile dressing. External pleura vac attached. No immediate complication. Patient tolerated the procedure well. IMPRESSION: Successful CT-guided 12 French right chest tube insertion for empyema Electronically Signed   By: CHRISTELLA.  Shick M.D.   On: 06/24/2024 13:38    Labs:  CBC: Recent Labs    06/23/24 0339 06/24/24 0459 06/25/24 0344 06/26/24 0153  WBC 11.3* 11.1* 7.6 7.0  HGB 8.8* 9.1* 9.2* 8.7*  HCT 26.4* 27.8* 28.0* 26.9*  PLT 492* 494* 479* 464*    COAGS: Recent Labs    06/20/24 1900  INR 1.1    BMP: Recent Labs    06/23/24 0339 06/24/24 0459 06/25/24 0344 06/26/24 0153  NA 134* 131* 134* 136  K 3.7 3.3* 4.2 3.6  CL 103 100 105 102  CO2 22 22 22  21*  GLUCOSE 92 98 88 75  BUN <5* <5* <5* 5*  CALCIUM 8.4* 8.3* 8.1* 8.7*  CREATININE 0.49 0.49 0.43* 0.52  GFRNONAA >60 >60 >60 >60    LIVER FUNCTION TESTS: Recent Labs    05/22/24 1133 06/20/24 1641  BILITOT 1.0 0.5   AST 21 22  ALT 12 12  ALKPHOS 76 119  PROT 7.3 7.8  ALBUMIN 3.1* 2.3*    Assessment and Plan: R empyema s/p chest tube placement 8/11 IR consulted for possible chest tube removal today.  Patient reports overall improvement.  Her WBC has normalized, her VSS.  Remains afebrile.  On Invanz  for Klebisella in fluid culture.  PICC placed today for ongoing abx.  Chest tube output does appear slightly thick and cloudy, however output has been minimal.  Approximately 50 mL output since last mark on pleurevac container this AM.  Connected to wall suction.   Removed wall suction and will leave to water seal overnight.  If output remain low without clinical change will remove tube as desired by managing service.  Electronically Signed: Abbey Veith Sue-Ellen Rylei Codispoti, PA 06/26/2024, 5:08 PM   I spent a total of 25 Minutes at the the patient's bedside AND on the patient's [name] floor or unit, greater than 50% of which was counseling/coordinating care for empyema.

## 2024-06-26 NOTE — Progress Notes (Signed)
 Peripherally Inserted Central Catheter Placement  The IV Nurse has discussed with the patient and/or persons authorized to consent for the patient, the purpose of this procedure and the potential benefits and risks involved with this procedure.  The benefits include less needle sticks, lab draws from the catheter, and the patient may be discharged home with the catheter. Risks include, but not limited to, infection, bleeding, blood clot (thrombus formation), and puncture of an artery; nerve damage and irregular heartbeat and possibility to perform a PICC exchange if needed/ordered by physician.  Alternatives to this procedure were also discussed.  Bard Power PICC patient education guide, fact sheet on infection prevention and patient information card has been provided to patient /or left at bedside.    PICC Placement Documentation  PICC Single Lumen 06/26/24 Right Brachial 42 cm 0 cm (Active)  Indication for Insertion or Continuance of Line Home intravenous therapies (PICC only) 06/26/24 1000  Exposed Catheter (cm) 0 cm 06/26/24 1000  Site Assessment Clean, Dry, Intact 06/26/24 1000  Line Status Flushed;Saline locked;Blood return noted 06/26/24 1000  Dressing Type Transparent;Securing device 06/26/24 1000  Dressing Status Antimicrobial disc/dressing in place;Clean, Dry, Intact 06/26/24 1000  Line Care Connections checked and tightened 06/26/24 1000  Line Adjustment (NICU/IV Team Only) No 06/26/24 1000  Dressing Intervention New dressing;Adhesive placed at insertion site (IV team only) 06/26/24 1000  Dressing Change Due 07/03/24 06/26/24 1000       Ethyl Priestly Renee 06/26/2024, 10:01 AM

## 2024-06-26 NOTE — Progress Notes (Addendum)
 Nutrition Follow-up  DOCUMENTATION CODES:   Severe malnutrition in context of acute illness/injury  INTERVENTION:  Continue regular diet as ordered Encourage husband to bring patients preferred protein powder prn Pt agreeable to Ensure Plus High Protein BID Continue to encourage increased PO intake    NUTRITION DIAGNOSIS:   Severe Malnutrition related to acute illness (mediastinal abscess) as evidenced by energy intake < or equal to 50% for > or equal to 5 days, severe muscle depletion.  Ongoing  GOAL:   Patient will meet greater than or equal to 90% of their needs  Progressing  MONITOR:   Diet advancement, Labs, Weight trends  REASON FOR ASSESSMENT:   Consult Assessment of nutrition requirement/status  ASSESSMENT:   Pt admitted with worsening mediastinal abscess. PMH significant for breast cancer s/p radiation, pericarditis.  CTAB w/ contrast on 8/4 shows Multi-septate para-esophageal fluid collection measuring approximately 4 x 3 x 8 cm, which remains concerning for mediastinal abscess.   Spoke to pt at bedside for f/u. Pt now on regular diet, no recent documented PO intake. Pt states she ate about 40% of breakfast this morning, told MD she ate 50% of meal yesterday (unsure which one). Pt overall improving, may have chest tube removed today per MD notes. Pt denies chewing/swallowing difficulties. Last BM documented as type 7 on 8/12. Pt states she is doing ok, attempting to increase PO intake. Discussed importance of adequate kcal/pro intake for healing, maintaining function. Pt agreeable to trying Ensure Plus High Protein BID (vanilla), will add MVI and probiotics as well. No new weight since last review, requested one from nursing. Pt denies additional questions/concerns at this time, will continue to monitor, RDN available prn.   Labs BUN 5 Calcium 8.7 Albumin 2.3 H/H 8.7/26.9  Medications  Chlorhexidine  Gluconate Cloth  6 each Topical Daily   enoxaparin   (LOVENOX ) injection  40 mg Subcutaneous Q24H   melatonin  3 mg Oral QHS   pantoprazole   40 mg Oral Daily   polyethylene glycol  17 g Oral Daily   senna-docusate  1 tablet Oral QHS   sodium chloride  flush  10 mL Intrapleural Q8H   sodium chloride  flush  10-40 mL Intracatheter Q12H    NUTRITION - FOCUSED PHYSICAL EXAM:  Flowsheet Row Most Recent Value  Orbital Region Moderate depletion  Upper Arm Region No depletion  Thoracic and Lumbar Region Mild depletion  Buccal Region Mild depletion  Temple Region Mild depletion  Clavicle Bone Region Severe depletion  Clavicle and Acromion Bone Region Severe depletion  Scapular Bone Region Severe depletion  Dorsal Hand Moderate depletion  Patellar Region Severe depletion  Anterior Thigh Region Severe depletion  Posterior Calf Region Moderate depletion  Edema (RD Assessment) None  Hair Reviewed  Eyes Reviewed  Mouth Other (Comment)  [pt reports having teeth pulled in April]  Skin Reviewed  Nails Reviewed    Diet Order:   Diet Order             Diet regular Room service appropriate? Yes; Fluid consistency: Thin  Diet effective now                  EDUCATION NEEDS:   Education needs have been addressed  Skin:  Skin Assessment: Reviewed RN Assessment  Last BM:  8/10  Height:   Ht Readings from Last 1 Encounters:  06/20/24 5' 9 (1.753 m)    Weight:   Wt Readings from Last 1 Encounters:  06/20/24 69.9 kg    BMI:  Body mass  index is 22.74 kg/m.  Estimated Nutritional Needs:   Kcal:  1900-2100  Protein:  95-105g  Fluid:  >/=1.9L  Garrit Marrow Daml-Budig, RDN, LDN Registered Dietitian Nutritionist RD Inpatient Contact Info in [place]

## 2024-06-26 NOTE — Progress Notes (Signed)
 [facility]    Date of Admission:  06/20/2024    Total days of antibiotics  2 days of Zosyn  8/9-8/10 Ertapenem  Day 3                                                                            ID: Sue [name] is a 58 y.o. female with  PMH of malnutrition, breast cancer in 2015 s/p radiation, that presents for mediastinal abscess after resolution of retropharyngeal abscess and is s/p IV antibiotics and PO antibiotics.  Principal Problem:   Mediastinal abscess with possible sepsis Active Problems:   Abscess of supraglottic region   Infection   Leukocytosis   Chronic health problem   Hyponatremia   Loculated pleural effusion, bilateral   Unintentional weight loss of more than 10 pounds in 90 days   Malnutrition with unintended weight loss   Protein-calorie malnutrition, severe    Subjective: Patient reports that she is doing well this morning. She states that her breathing is doing good. She feels that she is still having some trouble tasting, especially with carbohydrates. She was told that they make take her chest tube out this morning, but she is still waiting on final word about. She states that she is excited at the prospect of this.   Medications:   Chlorhexidine  Gluconate Cloth  6 each Topical Daily   enoxaparin  (LOVENOX ) injection  40 mg Subcutaneous Q24H   melatonin  3 mg Oral QHS   pantoprazole   40 mg Oral Daily   polyethylene glycol  17 g Oral Daily   senna-docusate  1 tablet Oral QHS   sodium chloride  flush  10 mL Intrapleural Q8H   sodium chloride  flush  10-40 mL Intracatheter Q12H    Objective: Vital signs in last 24 hours: Temp:  [97.6 F (36.4 C)-98.5 F (36.9 C)] 97.6 F (36.4 C) (08/13 0731) Pulse Rate:  [93-101] 93 (08/13 0731) BP: (100-124)/(36-73) 100/73 (08/13 0731) SpO2:  [97 %-100 %] 99 % (08/13 0731)  Physical Exam Constitutional:      Comments: Chronically ill appearing  Cardiovascular:     Rate and Rhythm:  Normal rate.     Heart sounds: No murmur heard. Pulmonary:     Effort: Pulmonary effort is normal. No respiratory distress.     Breath sounds: No wheezing.     Comments: Decreased breath sounds at the bases bilaterally posteriorly.  Right sided chest tube site. Mild tenderness. 150 ml of purulent thick fluid  Neurological:     Mental Status: She is alert.     Lab Results Recent Labs    06/25/24 0344 06/26/24 0153  WBC 7.6 7.0  HGB 9.2* 8.7*  HCT 28.0* 26.9*  NA 134* 136  K 4.2 3.6  CL 105 102  CO2 22 21*  BUN <5* 5*  CREATININE 0.43* 0.52   Liver Panel No results for input(s): PROT, ALBUMIN, AST, ALT, ALKPHOS, BILITOT, BILIDIR, IBILI in the last 72 hours. Sedimentation Rate No results for input(s): ESRSEDRATE in the last 72 hours. C-Reactive Protein No results for input(s): CRP in the last 72 hours.  Microbiology:  Studies/Results: US  EKG SITE RITE Result Date: 06/25/2024 If Site [facility]  not attached, placement could not be confirmed due to current cardiac rhythm.  DG Chest Port 1 View Result Date: 06/25/2024 CLINICAL DATA:  Empyema with chest tube in place EXAM: PORTABLE CHEST 1 VIEW COMPARISON:  Chest x-ray 06/20/2024 FINDINGS: Pigtail thoracostomy tube present within the lateral base of the right pleural space. Decreased loculated pleural effusion. Persistent atelectasis. No pneumothorax. Trace left pleural effusion is similar. Cardiac and mediastinal contours are unchanged. IMPRESSION: Well-positioned right basilar chest tube with decreased loculated right-sided pleural effusion. Small layering left pleural effusion with associated atelectasis. Electronically Signed   By: Wilkie Lent M.D.   On: 06/25/2024 12:49     Assessment/Plan: Sue [name] is a 58 y.o. female with  PMH of malnutrition, breast cancer  in 2015 s/p radiation, that presents for mediastinal abscess after resolution of retropharyngeal abscess and is s/p IV antibiotics  and PO antibiotics.   #Mediastinal abscess with possible sepsis #s/p POD 2 CT guided chest tube placement #Pneumonia with consolidation in right middle and lower lobe #loculated pleural effusion #empyema Patient is stable compared to yesterday. She is hemodynamically stable on room air. 150 mL of purulent drainage noted from chest drain. Seems at this time chest tube is to remain on suction and CT surgery will continue to follow to assess the need and see when this can be discontinued.    - Gram stain from abscess on 8/11 shows moderate WBC, predominantly PMN, with rare gram positive cocci and rare gram negative rods. Abundant streptococcus constellatus with susceptibilities pending.   - Gram stain from sputum shows abundant gram positive cocci in pairs in chains with few gram variable rod. Klebsiella aerogenes present   - Blood cultures final. Show NGTD x 5 days.   Plan: Continue Ertapenem  IV Day 3. Patient will likely need 2-3 weeks of IV antibiotics. Order PICC line placed. Pending placement of it.  - Will continue to follow for cultures and susceptibilities from abscess drainage.  - Symptom improvement and being hemodynamically stable is reassuring  [facility] for Infectious Diseases Pager: [phone]  06/26/2024, 1:43 PM

## 2024-06-27 ENCOUNTER — Inpatient Hospital Stay (HOSPITAL_COMMUNITY)

## 2024-06-27 DIAGNOSIS — J390 Retropharyngeal and parapharyngeal abscess: Secondary | ICD-10-CM

## 2024-06-27 DIAGNOSIS — B966 Bacteroides fragilis [B. fragilis] as the cause of diseases classified elsewhere: Secondary | ICD-10-CM

## 2024-06-27 DIAGNOSIS — B954 Other streptococcus as the cause of diseases classified elsewhere: Secondary | ICD-10-CM

## 2024-06-27 LAB — BASIC METABOLIC PANEL WITH GFR
Anion gap: 10 (ref 5–15)
BUN: 5 mg/dL — ABNORMAL LOW (ref 6–20)
CO2: 22 mmol/L (ref 22–32)
Calcium: 8.6 mg/dL — ABNORMAL LOW (ref 8.9–10.3)
Chloride: 103 mmol/L (ref 98–111)
Creatinine, Ser: 0.51 mg/dL (ref 0.44–1.00)
GFR, Estimated: >60 mL/min (ref >60)
Glucose, Bld: 73 mg/dL (ref 70–99)
Potassium: 4 mmol/L (ref 3.5–5.1)
Sodium: 135 mmol/L (ref 135–145)

## 2024-06-27 LAB — CBC WITH DIFFERENTIAL/PLATELET
Abs Immature Granulocytes: 0.03 K/uL (ref 0.00–0.07)
Basophils Absolute: 0 K/uL (ref 0.0–0.1)
Basophils Relative: 1 %
Eosinophils Absolute: 0.1 K/uL (ref 0.0–0.5)
Eosinophils Relative: 1 %
HCT: 27.1 % — ABNORMAL LOW (ref 36.0–46.0)
Hemoglobin: 8.9 g/dL — ABNORMAL LOW (ref 12.0–15.0)
Immature Granulocytes: 1 %
Lymphocytes Relative: 21 %
Lymphs Abs: 1.4 K/uL (ref 0.7–4.0)
MCH: 32.2 pg (ref 26.0–34.0)
MCHC: 32.8 g/dL (ref 30.0–36.0)
MCV: 98.2 fL (ref 80.0–100.0)
Monocytes Absolute: 0.9 K/uL (ref 0.1–1.0)
Monocytes Relative: 13 %
Neutro Abs: 4.2 K/uL (ref 1.7–7.7)
Neutrophils Relative %: 63 %
Platelets: 441 K/uL — ABNORMAL HIGH (ref 150–400)
RBC: 2.76 MIL/uL — ABNORMAL LOW (ref 3.87–5.11)
RDW: 14.5 % (ref 11.5–15.5)
WBC: 6.6 K/uL (ref 4.0–10.5)
nRBC: 0 % (ref 0.0–0.2)

## 2024-06-27 LAB — PHOSPHORUS: Phosphorus: 3.9 mg/dL (ref 2.5–4.6)

## 2024-06-27 NOTE — TOC Progression Note (Addendum)
 Transition of Care [facility]) - Progression Note    Patient Details  Name: Sue [name] MRN: [id number] Date of Birth: [birth year]  Transition of Care [facility]) CM/SW Contact  Rosaline JONELLE Joe, RN Phone Number: 06/27/2024, 10:33 AM  Clinical Narrative:    CM noted that patient will likely have CT removed today in IR.  I called and spoke with Holley Herring, RNCM with [facility] and she will provide teaching for IV infusions at the bedside later on this afternoon and will coordinate home IV anitbiotics once OPAT is placed.  HH RN order placed to be co-signed by MD.  [name] will provide the home health RN to PICC line dressing changes and lab draws from the line at the home.  Patient will discharge home with family by car when stable in the next 1-2 days.  Culture sensitivities are still pending at this time per ID.  Holley Herring, RNCM with [facility] was updated and she plans to provide teaching this afternoon and IV antibiotics coordination once the OPAT is placed and co-signed by attending team.  Patient will likely discharge to home tomorrow if sensitivities are back, OPAT is in place and teaching has been completed.  HH RN order placed to be co-signed by MD Team.  Bedside nursing is aware that patient will likely discharge in the next 1-2 days.  CT was removed today as well.                     Expected Discharge Plan and Services                                               Social Drivers of Health (SDOH) Interventions SDOH Screenings   Food Insecurity: No Food Insecurity (06/25/2024)  Housing: Low Risk  (06/25/2024)  Transportation Needs: No Transportation Needs (06/25/2024)  Utilities: Not At Risk (06/25/2024)  Depression (PHQ2-9): Low Risk  (10/10/2023)  Social Connections: Unknown (03/29/2022)   Received from [facility]  Tobacco Use: Medium Risk (06/24/2024)    Readmission Risk Interventions    06/25/2024    4:26 PM  Readmission Risk Prevention  Plan  Post Dischage Appt Complete  Medication Screening Complete  Transportation Screening Complete

## 2024-06-27 NOTE — Plan of Care (Signed)
 Updated patient and husband at bedside about plan to d/c tomorrow.

## 2024-06-27 NOTE — Plan of Care (Signed)

## 2024-06-27 NOTE — Progress Notes (Signed)
      [address] [place] [address]             [phone]         Subjective: Patient sitting up in bed, reports she is doing good this AM  Objective: Vital signs in last 24 hours: Temp:  [97.6 F (36.4 C)-98.7 F (37.1 C)] 98.3 F (36.8 C) (08/14 0451) Pulse Rate:  [93-100] 95 (08/14 0451) Resp:  [17] 17 (08/13 1457) BP: (100-130)/(73-91) 130/84 (08/14 0451) SpO2:  [98 %-99 %] 98 % (08/14 0451)  Hemodynamic parameters for last 24 hours:    Intake/Output from previous day: 08/13 0701 - 08/14 0700 In: 283 [P.O.:240; I.V.:20] Out: 95 [Drains:95] Intake/Output this shift: No intake/output data recorded.  General appearance: alert, cooperative, and no distress Neurologic: intact Heart: regular rate and rhythm, S1, S2 normal, no murmur, click, rub or gallop Lungs: clear to auscultation bilaterally Wound: Clean and dry dressing in place  Lab Results: Recent Labs    06/26/24 0153 06/27/24 0233  WBC 7.0 6.6  HGB 8.7* 8.9*  HCT 26.9* 27.1*  PLT 464* 441*   BMET:  Recent Labs    06/26/24 0153 06/27/24 0233  NA 136 135  K 3.6 4.0  CL 102 103  CO2 21* 22  GLUCOSE 75 73  BUN 5* 5*  CREATININE 0.52 0.51  CALCIUM 8.7* 8.6*    PT/INR: No results for input(s): LABPROT, INR in the last 72 hours. ABG No results found for: PHART, HCO3, TCO2, ACIDBASEDEF, O2SAT CBG (last 3)  No results for input(s): GLUCAP in the last 72 hours.  Assessment/Plan: S/P right chest tube placement by IR  CV: Stable vital signs   Pulm: Saturating well on RA. Chest tube in place to waterseal. CT output 95cc/24hrs recorded. Mostly milky purulent fluid but some serous drainage now. No air leak. CXR 08/12 with improved loculated right basilar pleural effusion, no pneumothorax. Chest tube output is less than 150cc, as discussed with Dr. Shyrl yesterday have asked IR to d/c chest tube. Per IR notes transitioned to water seal yesterday with plan  to d/c today. Encourage IS and ambulation.    ID: Leukocytosis resolved and afebrile. Abscess culture with abundant streptococcus constellatus, moderate bacteroides fragilis, beta lactamase positive. ID following, on IV ertapenem  to continue 2-3 weeks of IV abx. PICC has been placed.    Dispo: Drainage has slowed and looks to be more serous. IR to remove chest tube today. I have arranged a follow up appointment in our office. Will check CXR after chest tube removal and if it looks good we will sign off. Please call with any further questions.      LOS: 7 days    Con GORMAN Bend, PA-C 06/27/2024

## 2024-06-27 NOTE — Progress Notes (Addendum)
 This is an inpatient with history of  paraesophageal fluid collection resolution of retropharyngeal abscess. Recent chest tube placement with Dr. Vanice on 8/11.  Request from [facility] [place] for chest tube removal.  Per Epic output is <150 mL of primarily serous with scant milky fluid. Case discussed with Dr. Jennefer who also agrees with team's plan for removal.   Informed verbal consent was obtained from the patient after a discussion of the risks, benefits and alternatives to treatment. The 12 Fr  left lateral chest tube was prepped.  The external suture was cut and the chest tube was removed in its entirety successfully without incident. Skin site unremarkable with no erythema, tenderness or drainage noted. Petroleum gauze placed over the  exit site with a clean dressing and Tegaderm applied over the petroleum gauze. Patient tolerated the procedure well.   All questions of the patient's were answered at this time   [place] Evana Runnels NP 06/27/2024 3:27 PM

## 2024-06-27 NOTE — Progress Notes (Signed)
 Chest tube output for the entire night shift was just 5 ml.

## 2024-06-27 NOTE — Assessment & Plan Note (Addendum)
 Eating 50% or more of meals. - Appreciate ongoing RD recommendations - Monitoring for refeeding with daily Phos, BMP, mag - Continue to encourage p.o. intake as able.

## 2024-06-27 NOTE — Discharge Summary (Addendum)
 Family Medicine Teaching [facility] Discharge Summary  Patient name: Sue [name] Medical record number: [id number] Date of birth: [birth year] Age: 58 y.o. Gender: female Date of Admission: 06/20/2024  Date of Discharge: 06/28/2024 Admitting Physician: Laymon JINNY Legions, MD  Primary Care Provider: Rudy Carlin LABOR, MD Consultants:   Indication for Hospitalization: mediastinal abscess  Brief Hospital Course:  Sue [name] is a 58 y.o.female with a history of Breast cancer s/p radiation, Pericarditis, and Mediastinal abscess admitted to the [facility] Medicine Teaching Service at [facility] after a repeat CT Chest scan result concerning for worsening mediastinal abscess on 06/17/2024.  Her hospital course is detailed below:  Mediastinal abscess with possible sepsis Initial CT concerning for mediastinal abscess with pleural fluid collection. She was initially placed on IV Zosyn  (8/7) then switched to IV Ertapenem  1 g daily by ID with Pleurevac drain placement by IR on 8/11 with exudative output. Pleurevac chest tube removed by IR on 08/14 and patient medically stable for discharge at that time on 3 weeks of ertapenem  through PICC line (to end on 07/15/24).  Malnutrition with unintended weight loss Patient with unintended weight loss secondary to loss of appetite and change of taste. She was supplementing only with cow's milk and alternative milk to get some nutrients in. With treatment of underlying condition, patient began to have increased appetite and began to eat 50%+ of her meals throughout the hospital course.   Other chronic conditions were medically managed with home medications and formulary alternatives as necessary.  PCP Follow-up Recommendations 1. Continue to follow with her poor PO intake 2. Follow up on anemia, consider repeat CBC 3. Follow up on PICC line hygiene and home abx administration 5. Follow up with ID  Discharge Diagnoses/Problem List:  [facility]     * (Principal) Mediastinal abscess with possible sepsis     Abscess of supraglottic region     Infection     Leukocytosis     Chronic health problem     Hyponatremia     Loculated pleural effusion, bilateral     Unintentional weight loss of more than 10 pounds in 90 days     Malnutrition with unintended weight loss     Protein-calorie malnutrition, severe   Disposition: Home  Discharge Condition: Stable  Discharge Exam:  General: A&O, NAD Cardiac: RRR, no m/r/g Respiratory: CTAB, normal WOB, no w/c/r GI: Soft, NTTP, non-distended  Extremities: NTTP, no peripheral edema. Neuro: moves all four extremities appropriately. Psych: Appropriate mood and affect  Significant Procedures: Pleurevac chest tube placement, PICC line placement  Significant Labs and Imaging:  Recent Labs  Lab 06/27/24 0233 06/28/24 0400  WBC 6.6 5.7  HGB 8.9* 8.4*  HCT 27.1* 25.9*  PLT 441* 421*   Recent Labs  Lab 06/27/24 0233 06/28/24 0400  NA 135 134*  K 4.0 3.5  CL 103 102  CO2 22 23  GLUCOSE 73 86  BUN 5* 5*  CREATININE 0.51 0.39*  CALCIUM 8.6* 8.5*  PHOS 3.9 4.5   Results/Tests Pending at Time of Discharge: none  Discharge Medications:  Allergies as of 06/28/2024   No Known Allergies      Medication List     STOP taking these medications    amoxicillin -clavulanate 875-125 MG tablet Commonly known as: AUGMENTIN    colchicine  0.6 MG tablet   ibuprofen  800 MG tablet Commonly known as: ADVIL    megestrol  40 MG tablet Commonly known as: MEGACE   oxyCODONE  5 MG immediate release tablet Commonly known as: Oxy IR/ROXICODONE        TAKE these medications    acetaminophen  500 MG tablet Commonly known as: TYLENOL  Take 1,500 mg by mouth as needed for mild pain (pain score 1-3) or moderate pain (pain score 4-6).   ertapenem  IVPB Commonly known as: INVANZ  Inject 1 g into the vein daily for 17 days. Indication:  Mediastinal/pleural abscess First  Dose: Yes Last Day of Therapy:  07/15/24 Labs - Once weekly:  CBC/D and BMP, Labs - Once weekly: ESR and CRP Method of administration: Mini-Bag Plus / Gravity Method of administration may be changed at the discretion of home infusion pharmacist based upon assessment of the patient and/or caregiver's ability to self-administer the medication ordered.               Discharge Care Instructions  (From admission, onward)           Start     Ordered   06/28/24 0000  Change dressing on IV access line weekly and PRN  (Home infusion instructions - [facility] )        06/28/24 1018            Discharge Instructions: Please refer to Patient Instructions section of EMR for full details.  Patient was counseled important signs and symptoms that should prompt return to medical care, changes in medications, dietary instructions, activity restrictions, and follow up appointments.   Follow-Up Appointments:  Follow-up Information     Rutha Manuelita HERO, PA-C Follow up on 07/12/2024.   Specialties: Physician Assistant, Thoracic Surgery Why: Follow up appointment is at 11:30AM, please get a chest xray 1 hour prior to your appointment on the 2nd floor of our office Contact information: [address], [place] [address] [phone]         [facility] CARE. Call.   Why: You are scheduled for a hospital follow up on Thursday, August 21, at 10:20 am with Greig Cluster, NP. Contact information: [address] [place]  [address] [phone]        [facility] Follow up.   Why: [facility] will provide home health RN for PICC line dressing changes and lab draws along with you provision of IV antibiotics for home.                Lupie Credit, DO 06/28/2024, 4:06 PM PGY-1, [facility]  Upper Level Addendum: I have seen and evaluated this patient along with Dr. Lupie and reviewed the above note, making necessary  revisions as appropriate. I agree with the medical decision making and physical exam as noted above. Elyce Prescott, DO PGY-2 [facility] Family Medicine Residency

## 2024-06-27 NOTE — Assessment & Plan Note (Addendum)
 Sputum culture positive for klebsiella. S/p CT-guided chest tube placement for dominant pleural fluid collection 8/11. Chest tube output 5mL overnight.  - Appreciate ongoing CT surgery recommendations  - plan for chest tube removal 08/14 - Appreciate ID consult and recommendations - Completed IV Zosyn  08/07-08-11 - Continue IV ertapenem  1g (06/24/24 -) - likely need 2-3 weeks of IV abx tx at home - PICC line placed 08/13 - Encourage ambulation, incentive spirometry

## 2024-06-27 NOTE — Discharge Instructions (Addendum)
 Dear Sue [name],   Thank you for letting us  participate in your care! You received IV antibiotics and a tube to drain infection out of your chest. You will be sent home with a PICC to receive IV antibiotics through for the next few weeks. Please follow up with your PCP and CT surgery as scheduled.   POST-HOSPITAL & CARE INSTRUCTIONS We recommend following up with your PCP within 1 week from being discharged from the hospital. Please let PCP/Specialists know of any changes in medications that were made which you will be able to see in the medications section of this packet  DOCTOR'S APPOINTMENTS & FOLLOW UP Future Appointments  Date Time Provider Department Center  07/04/2024 10:20 AM Sue Greig BRAVO, NP [facility] None  07/12/2024 11:30 AM Sue Manuelita CHRISTELLA, PA-C [facility]  07/16/2024  9:00 AM Sue [name], Sue SAILOR, Sue [name] [facility]     Thank you for choosing [facility]! Take care and be well!  Family Medicine Teaching Service Inpatient Team [place]  [facility]  [address] [place], [place] [address] [phone]

## 2024-06-27 NOTE — Progress Notes (Signed)
 Mobility Specialist Progress Note:   06/27/24 1120  Mobility  Activity Ambulated independently  Level of Assistance Independent  Assistive Device None  Distance Ambulated (ft) 450 ft  Activity Response Tolerated well  Mobility Referral Yes  Mobility visit 1 Mobility  Mobility Specialist Start Time (ACUTE ONLY) 1120  Mobility Specialist Stop Time (ACUTE ONLY) 1130  Mobility Specialist Time Calculation (min) (ACUTE ONLY) 10 min   Pt agreeable to mobility session. Required no physical assistance. No c/o throughout. Back in bed with all needs met.   Therisa Rana Mobility Specialist Please contact via SecureChat or  Rehab office at [phone]

## 2024-06-27 NOTE — Progress Notes (Signed)
 Daily Progress Note Intern Pager: [phone]  Patient name: Sue [name] Medical record number: [id number] Date of birth: 1966/03/19 Age: 58 y.o. Gender: female  Primary Care Provider: Rudy Carlin LABOR, MD Consultants: CT surgery, IR, ID Code Status: FULL  Pt Overview and Major Events to Date:  08/07: Admitted for mediastinal abscess and loculated bilateral pleural effusions  08/07: Abx treatment with IV Zosyn  08/11: Abx treatment transition to IV ertapenem  08/12: Pleurevac placement by CT surgery 08/13: PICC line placed  08/14: Pleurevac chest tube removal by IR planned  Assessment and Plan:  Sue [name] is a 58 y.o. F admitted for mediastinal abscess and loculated bilateral pleural effusions after initial diagnosis of posterior mediastinum compartment and distal esophagus fluid collections treated with IV abx and oral Augmentin .  Hospital course has been significant for Pleurevac chest tube placement with exudative output. She continues to be administered IV abx ertapenem  and has a PICC line placed for outpatient abx treatment for 2-3 weeks per ID. Assessment & Plan Mediastinal abscess with possible sepsis Loculated pleural effusion, bilateral Sputum culture positive for klebsiella. S/p CT-guided chest tube placement for dominant pleural fluid collection 8/11. Chest tube output 5mL overnight.  - Appreciate ongoing CT surgery recommendations  - plan for chest tube removal 08/14 - Appreciate ID consult and recommendations - Completed IV Zosyn  08/07-08-11 - Continue IV ertapenem  1g (06/24/24 -) - likely need 2-3 weeks of IV abx tx at home - PICC line placed 08/13 - Encourage ambulation, incentive spirometry Malnutrition with unintended weight loss Protein-calorie malnutrition, severe Eating 50% or more of meals. - Appreciate ongoing RD recommendations - Monitoring for refeeding with daily Phos, BMP, mag - Continue to encourage p.o. intake as able.  Chronic health  problem Breast cancer s/p radiation therapy  Anemia of neoplastic disease Emphysema Hyponatremia: Continue to monitor with daily BMP   FEN/GI: regular diet PPx: Lovenox  Dispo:Home today pending chest tube removal.  Subjective:  Patient was seen and evaluated at bedside.  She states she is doing well overall and is ready to go home.  She has no complaints today.  There was approximately 5 mL of output within the Pleur-evac overnight.  Objective: Temp:  [97.6 F (36.4 C)-98.7 F (37.1 C)] 98.3 F (36.8 C) (08/14 0451) Pulse Rate:  [93-100] 95 (08/14 0451) Resp:  [17] 17 (08/13 1457) BP: (100-130)/(73-91) 130/84 (08/14 0451) SpO2:  [98 %-99 %] 98 % (08/14 0451)  Physical Exam: General: A&O, NAD Cardiac: RRR, no m/r/g Respiratory: CTAB, normal WOB, no w/c/r GI: Soft, NTTP, non-distended, BS present Extremities: NTTP, no peripheral edema Neuro: moves all four extremities appropriately Psych: Appropriate mood and affect   Laboratory: Most recent CBC Lab Results  Component Value Date   WBC 6.6 06/27/2024   HGB 8.9 (L) 06/27/2024   HCT 27.1 (L) 06/27/2024   MCV 98.2 06/27/2024   PLT 441 (H) 06/27/2024   Most recent BMP    Latest Ref Rng & Units 06/27/2024    2:33 AM  BMP  Glucose 70 - 99 mg/dL 73   BUN 6 - 20 mg/dL 5   Creatinine 9.55 - 8.99 mg/dL 9.48   Sodium 864 - 854 mmol/L 135   Potassium 3.5 - 5.1 mmol/L 4.0   Chloride 98 - 111 mmol/L 103   CO2 22 - 32 mmol/L 22   Calcium 8.9 - 10.3 mg/dL 8.6    Lupie Credit, DO 06/27/2024, 7:11 AM  PGY-1, [facility] Health Family Medicine FPTS Intern pager: [phone],  text pages welcome Secure chat group [facility] Teaching Service

## 2024-06-27 NOTE — Assessment & Plan Note (Addendum)
 Breast cancer s/p radiation therapy  Anemia of neoplastic disease Emphysema Hyponatremia: Continue to monitor with daily BMP

## 2024-06-27 NOTE — Progress Notes (Signed)
 [facility]    Date of Admission:  06/20/2024    Total days of antibiotics  2 days of Zosyn  8/9-8/10 Ertapenem  Day 4                                                                          ID: Sue [name] is a 58 y.o. female with  PMH of malnutrition, breast cancer in 2015 s/p radiation, that presents for mediastinal abscess after resolution of retropharyngeal abscess and is s/p IV antibiotics and PO antibiotics and chest tube placement with IR.   Principal Problem:   Mediastinal abscess with possible sepsis Active Problems:   Abscess of supraglottic region   Infection   Leukocytosis   Chronic health problem   Hyponatremia   Loculated pleural effusion, bilateral   Unintentional weight loss of more than 10 pounds in 90 days   Malnutrition with unintended weight loss   Protein-calorie malnutrition, severe    Subjective:   Medications:   Chlorhexidine  Gluconate Cloth  6 each Topical Daily   enoxaparin  (LOVENOX ) injection  40 mg Subcutaneous Q24H   feeding supplement  237 mL Oral BID BM   melatonin  3 mg Oral QHS   multivitamin with minerals  1 tablet Oral Daily   pantoprazole   40 mg Oral Daily   polyethylene glycol  17 g Oral Daily   senna-docusate  1 tablet Oral QHS   sodium chloride  flush  10 mL Intrapleural Q8H   sodium chloride  flush  10-40 mL Intracatheter Q12H    Objective: Vital signs in last 24 hours: Temp:  [98.1 F (36.7 C)-98.7 F (37.1 C)] 98.1 F (36.7 C) (08/14 0753) Pulse Rate:  [95-100] 96 (08/14 0753) Resp:  [17-19] 19 (08/14 0753) BP: (114-133)/(80-91) 133/88 (08/14 0753) SpO2:  [98 %-99 %] 99 % (08/14 0753)  Physical Exam Constitutional:      Comments: Chronically ill appearing  Cardiovascular:     Rate and Rhythm: Normal rate.     Heart sounds: No murmur heard. Pulmonary:     Effort: Pulmonary effort is normal. No respiratory distress.     Breath sounds: No wheezing.     Comments: Decreased breath sounds at  the bases bilaterally posteriorly.  Right sided Chest tube. About 500 mL overall. More serous fluid  Neurological:     Mental Status: She is alert.     Lab Results Recent Labs    06/26/24 0153 06/27/24 0233  WBC 7.0 6.6  HGB 8.7* 8.9*  HCT 26.9* 27.1*  NA 136 135  K 3.6 4.0  CL 102 103  CO2 21* 22  BUN 5* 5*  CREATININE 0.52 0.51   Liver Panel No results for input(s): PROT, ALBUMIN, AST, ALT, ALKPHOS, BILITOT, BILIDIR, IBILI in the last 72 hours. Sedimentation Rate No results for input(s): ESRSEDRATE in the last 72 hours. C-Reactive Protein No results for input(s): CRP in the last 72 hours.  Microbiology:  Studies/Results: US  EKG SITE RITE Result Date: 06/25/2024 If Site Rite image not attached, placement could not be confirmed due to current cardiac rhythm.  DG Chest Port 1 View Result Date: 06/25/2024 CLINICAL DATA:  Empyema with chest tube in place EXAM: PORTABLE CHEST 1  VIEW COMPARISON:  Chest x-ray 06/20/2024 FINDINGS: Pigtail thoracostomy tube present within the lateral base of the right pleural space. Decreased loculated pleural effusion. Persistent atelectasis. No pneumothorax. Trace left pleural effusion is similar. Cardiac and mediastinal contours are unchanged. IMPRESSION: Well-positioned right basilar chest tube with decreased loculated right-sided pleural effusion. Small layering left pleural effusion with associated atelectasis. Electronically Signed   By: Wilkie Lent M.D.   On: 06/25/2024 12:49     Assessment/Plan: Sue [name] is a 58 y.o. female with  PMH of malnutrition, breast cancer  in 2015 s/p radiation, that presents for mediastinal abscess after resolution of retropharyngeal abscess is s/p chest tube placement POD 3 and on IV ertapenem  .   #Mediastinal abscess with possible sepsis #s/p POD 3 CT guided chest tube placement #Pneumonia with consolidation in right middle and lower lobe #loculated pleural  effusion #empyema IR will likely pull chest tube today as output has changed to a serous fluid.  Patient reports that her breathing is doing fine this morning and she is still getting some improvement in taste. Chest tube will likely be pulled today per CT surgery.   - Gram stain from abscess on 8/11 shows moderate WBC, predominantly PMN, with rare gram positive cocci and rare gram negative rods. Abundant streptococcus constellatus, moderate bacteroides fragilis with susceptibilities pending.   - Gram stain from sputum shows abundant gram positive cocci in pairs in chains with few gram variable rod. Klebsiella aerogenes present   - Blood cultures final. Show NGTD x 5 days.   Plan: Continue Ertapenem  IV Day 4. Patient will likely need 2-3 weeks of IV antibiotics. PICC line placed.  - Will continue to follow for cultures and susceptibilities from abscess drainage and make adjustments to antibiotic regimen based off of this - Symptom improvement and being hemodynamically stable is reassuring  [facility] for Infectious Diseases Pager: [phone]  06/27/2024, 9:49 AM

## 2024-06-28 LAB — BASIC METABOLIC PANEL WITH GFR
Anion gap: 9 (ref 5–15)
BUN: 5 mg/dL — ABNORMAL LOW (ref 6–20)
CO2: 23 mmol/L (ref 22–32)
Calcium: 8.5 mg/dL — ABNORMAL LOW (ref 8.9–10.3)
Chloride: 102 mmol/L (ref 98–111)
Creatinine, Ser: 0.39 mg/dL — ABNORMAL LOW (ref 0.44–1.00)
GFR, Estimated: >60 mL/min (ref >60)
Glucose, Bld: 86 mg/dL (ref 70–99)
Potassium: 3.5 mmol/L (ref 3.5–5.1)
Sodium: 134 mmol/L — ABNORMAL LOW (ref 135–145)

## 2024-06-28 LAB — CBC WITH DIFFERENTIAL/PLATELET
Basophils Absolute: 0 K/uL (ref 0.0–0.1)
Basophils Relative: 0 %
Eosinophils Absolute: 0.1 K/uL (ref 0.0–0.5)
Eosinophils Relative: 2 %
HCT: 25.9 % — ABNORMAL LOW (ref 36.0–46.0)
Hemoglobin: 8.4 g/dL — ABNORMAL LOW (ref 12.0–15.0)
Lymphocytes Relative: 18 %
Lymphs Abs: 1 K/uL (ref 0.7–4.0)
MCH: 31.8 pg (ref 26.0–34.0)
MCHC: 32.4 g/dL (ref 30.0–36.0)
MCV: 98.1 fL (ref 80.0–100.0)
Monocytes Absolute: 0.2 K/uL (ref 0.1–1.0)
Monocytes Relative: 4 %
Neutro Abs: 4.3 K/uL (ref 1.7–7.7)
Neutrophils Relative %: 76 %
Platelets: 421 K/uL — ABNORMAL HIGH (ref 150–400)
RBC: 2.64 MIL/uL — ABNORMAL LOW (ref 3.87–5.11)
RDW: 14.6 % (ref 11.5–15.5)
WBC: 5.7 K/uL (ref 4.0–10.5)
nRBC: 0 % (ref 0.0–0.2)

## 2024-06-28 LAB — PHOSPHORUS: Phosphorus: 4.5 mg/dL (ref 2.5–4.6)

## 2024-06-28 MED ORDER — HEPARIN SOD (PORK) LOCK FLUSH 100 UNIT/ML IV SOLN
250.0000 [IU] | INTRAVENOUS | Status: AC | PRN
  Administered 2024-06-28: 250 [IU]

## 2024-06-28 MED ORDER — ERTAPENEM IV (FOR PTA / DISCHARGE USE ONLY): 1.0000 g | INTRAVENOUS | 0 refills | Status: AC

## 2024-06-28 MED ORDER — POTASSIUM CHLORIDE CRYS ER 20 MEQ PO TBCR
40.0000 meq | EXTENDED_RELEASE_TABLET | Freq: Once | ORAL | Status: AC
  Administered 2024-06-28: 40 meq via ORAL
  Filled 2024-06-28: qty 2

## 2024-06-28 NOTE — Assessment & Plan Note (Signed)
 Eating 50% or more of meals. - Appreciate ongoing RD recommendations - Monitoring for refeeding with daily Phos, BMP, mag - Continue to encourage p.o. intake as able.

## 2024-06-28 NOTE — Progress Notes (Signed)
 Patient to be discharged with Picc line for IV antibiotics, pt received teaching. Patient verbalized understanding of instructions.

## 2024-06-28 NOTE — Assessment & Plan Note (Addendum)
 Sputum culture positive for klebsiella. S/p CT-guided chest tube placement for dominant pleural fluid collection 8/11. Chest tube output 5mL overnight.  - Appreciate ongoing CT surgery recommendations  - plan for chest tube removal 08/14 - Appreciate ID consult and recommendations - Completed IV Zosyn  08/07-08-11 - Continue IV ertapenem  1g (06/24/24 -07/15/2024) - 3 weeks of IV abx tx at home - PICC line placed 08/13 - Encourage ambulation, incentive spirometry

## 2024-06-28 NOTE — Progress Notes (Signed)
 ID PROGRESS NOTE   Reviewed sensitivity data and patient improvement since having chest tube placed and abtx treatment  Plan for 3 wks of ertapenem  via picc line. To take through 07/15/2024. Will arrange follow up with dr fleeta dam   Diagnosis: Mediastinal abscess  Culture Result: polymicrobial  No Known Allergies  OPAT Orders Discharge antibiotics to be given via PICC line Discharge antibiotics: Per pharmacy protocol ertapenem  1gm iv daily  Duration: 3 wk End Date: 07/15/2024  [facility] Care Per Protocol:  Home health RN for IV administration and teaching; PICC line care and labs.    Labs weekly while on IV antibiotics: __x CBC with differential _x_ BMP  _x_ CRP _x_ ESR  _x_ Please keep PIC at completion of IV antibiotics since she sees dr fleeta dam on 9/2   Fax weekly labs to [phone]  Clinic Follow Up Appt: Sees dr fleeta dam on 9/2

## 2024-06-28 NOTE — Assessment & Plan Note (Signed)
 Breast cancer s/p radiation therapy  Anemia of neoplastic disease Emphysema Hyponatremia: Continue to monitor with daily BMP

## 2024-06-28 NOTE — TOC Transition Note (Addendum)
 Transition of Care [facility]) - Discharge Note   Patient Details  Name: Sue [name] MRN: [id number] Date of Birth: 07-Nov-1966  Transition of Care [facility] CM/SW Contact:  Rosaline JONELLE Joe, RN Phone Number: 06/28/2024, 10:50 AM   Clinical Narrative:    CM spoke with Holley Herring, RNCM with [employer] and she is aware that patient is stable to discharge to home today once PICC line infusion teaching has been completed and coordination of antibiotics to the home.  Herring is scheduling time for teaching today.    OPAT has been signed and Herring, RNCM with [name] will have insurance authorization completed and medication sent to the home.  I asked bedside nursing to contact IV Team to have PICC line extension placed prior to discharge home later with family.  Patient is ambulatory and should be discharged home later today after PICC line teaching completed.  06/28/24 1600 - I called and spoke with Holley Herring, RNCM and she plans to meet with the patient for teaching at the bedside prior to discharge today around 1700.  The bedside nurse is aware of patient discharging home afterwards and she was asked to place IV extension through IV Team prior to discharge to home.  Patient's next dose of Invanz  is scheduled for tomorrow in the home.  Herring, RNCM with [employer] will have medication send to the home before dose is due.         Patient Goals and CMS Choice            Discharge Placement                       Discharge Plan and Services Additional resources added to the After Visit Summary for                                       Social Drivers of Health (SDOH) Interventions SDOH Screenings   Food Insecurity: No Food Insecurity (06/25/2024)  Housing: Low Risk  (06/25/2024)  Transportation Needs: No Transportation Needs (06/25/2024)  Utilities: Not At Risk (06/25/2024)  Depression (PHQ2-9): Low Risk  (10/10/2023)  Social Connections: Unknown (03/29/2022)    Received from [facility]  Tobacco Use: Medium Risk (06/24/2024)     Readmission Risk Interventions    06/25/2024    4:26 PM  Readmission Risk Prevention Plan  Post Dischage Appt Complete  Medication Screening Complete  Transportation Screening Complete

## 2024-06-28 NOTE — Plan of Care (Signed)
   Problem: Education: Goal: Knowledge of General Education information will improve Description: Including pain rating scale, medication(s)/side effects and non-pharmacologic comfort measures Outcome: Progressing   Problem: Clinical Measurements: Goal: Ability to maintain clinical measurements within normal limits will improve Outcome: Progressing Goal: Respiratory complications will improve Outcome: Progressing Goal: Cardiovascular complication will be avoided Outcome: Progressing   Problem: Nutrition: Goal: Adequate nutrition will be maintained Outcome: Progressing   Problem: Coping: Goal: Level of anxiety will decrease Outcome: Progressing

## 2024-06-28 NOTE — Progress Notes (Signed)
 Daily Progress Note Intern Pager: [phone]  Patient name: Sue [name] Medical record number: [id number] Date of birth: [birth year] Age: 58 y.o. Gender: female  Primary Care Provider: Rudy Carlin LABOR, MD Consultants: CT surgery, IR, ID Code Status: FULL  Pt Overview and Major Events to Date:  08/07: Admitted for mediastinal abscess and loculated bilateral pleural effusions  08/07: Abx treatment with IV Zosyn  08/11: Abx treatment transition to IV ertapenem  08/12: Pleurevac placement by CT surgery 08/13: PICC line placed  08/14: Pleurevac chest tube removal 08/15: Medically stable for discharge  Assessment and Plan:  Sue [name] is a 58 y.o. F admitted for mediastinal abscess and loculated bilateral pleural effusions after initial diagnosis of posterior mediastinum compartment and distal esophagus fluid collections treated with IV abx and oral Augmentin .  Hospital course has been significant for Pleurevac chest tube placement with exudative output. She continues to be administered IV abx ertapenem  and has a PICC line placed for outpatient abx treatment for 2-3 weeks per ID. Plan to d/c today with 3 weeks of ertapenem  per ID. Assessment & Plan Mediastinal abscess with possible sepsis Loculated pleural effusion, bilateral Sputum culture positive for klebsiella. S/p CT-guided chest tube placement for dominant pleural fluid collection 8/11. Chest tube output 5mL overnight.  - Appreciate ongoing CT surgery recommendations  - plan for chest tube removal 08/14 - Appreciate ID consult and recommendations - Completed IV Zosyn  08/07-08-11 - Continue IV ertapenem  1g (06/24/24 -07/15/2024) - 3 weeks of IV abx tx at home - PICC line placed 08/13 - Encourage ambulation, incentive spirometry Malnutrition with unintended weight loss Protein-calorie malnutrition, severe Eating 50% or more of meals. - Appreciate ongoing RD recommendations - Monitoring for refeeding with daily Phos,  BMP, mag - Continue to encourage p.o. intake as able.  Chronic health problem Breast cancer s/p radiation therapy  Anemia of neoplastic disease Emphysema Hyponatremia: Continue to monitor with daily BMP   FEN/GI: regular diet PPx: Lovenox  Dispo:Home today.   Subjective:  Patient was seen and examined at bedside.  She has no complaints today.  States site of chest tube is healing well and is not sore today.  Objective: Temp:  [98.1 F (36.7 C)-98.3 F (36.8 C)] 98.3 F (36.8 C) (08/15 0410) Pulse Rate:  [92-102] 92 (08/15 0410) Resp:  [16-19] 16 (08/15 0410) BP: (107-133)/(77-89) 107/77 (08/15 0410) SpO2:  [98 %-100 %] 98 % (08/15 0410)  Physical Exam: General: A&O, NAD Cardiac: RRR, no m/r/g Respiratory: CTAB, normal WOB, no w/c/r GI: Soft, NTTP, non-distended Extremities: NTTP, no peripheral edema. Neuro: moves all four extremities appropriately. Psych: Appropriate mood and affect   Laboratory: Most recent CBC Lab Results  Component Value Date   WBC 5.7 06/28/2024   HGB 8.4 (L) 06/28/2024   HCT 25.9 (L) 06/28/2024   MCV 98.1 06/28/2024   PLT 421 (H) 06/28/2024   Most recent BMP    Latest Ref Rng & Units 06/28/2024    4:00 AM  BMP  Glucose 70 - 99 mg/dL 86   BUN 6 - 20 mg/dL 5   Creatinine 9.55 - 8.99 mg/dL 9.60   Sodium 864 - 854 mmol/L 134   Potassium 3.5 - 5.1 mmol/L 3.5   Chloride 98 - 111 mmol/L 102   CO2 22 - 32 mmol/L 23   Calcium 8.9 - 10.3 mg/dL 8.5     Imaging/Diagnostic Tests: CXR s/p Pleurevac chest tube removal Radiologist Impression: No recurrent pneumothorax or other abnormality following chest tube removal on  the right. Small pleural effusions and right basilar atelectasis are again seen. My interpretation: There is no pigtail catheter present. PICC line is present. Improved diffuse haziness from prior study 06/25/2024.  Lupie Credit, DO 06/28/2024, 7:01 AM  PGY-1, [facility] Health Family Medicine FPTS Intern pager: [phone], text pages  welcome Secure chat group [facility] Teaching Service

## 2024-06-28 NOTE — Progress Notes (Signed)
 Mobility Specialist: Progress Note   06/28/24 1100  Mobility  Activity Ambulated independently  Level of Assistance Independent  Assistive Device None  Distance Ambulated (ft) 450 ft  Activity Response Tolerated well  Mobility Referral Yes  Mobility visit 1 Mobility  Mobility Specialist Start Time (ACUTE ONLY) 0854  Mobility Specialist Stop Time (ACUTE ONLY) 0857  Mobility Specialist Time Calculation (min) (ACUTE ONLY) 3 min    Pt received in bed, agreeable to mobility session. Ind throughout. No complaints. SpO2 96% on RA. Returned to room without fault. Left ambulating in room, all needs met, call bell in reach.   Ileana Lute Mobility Specialist Please contact via SecureChat or Rehab office at [phone]

## 2024-06-29 ENCOUNTER — Ambulatory Visit: Payer: Self-pay | Admitting: Family Medicine

## 2024-06-29 DIAGNOSIS — J853 Abscess of mediastinum: Secondary | ICD-10-CM | POA: Diagnosis not present

## 2024-06-30 DIAGNOSIS — J853 Abscess of mediastinum: Secondary | ICD-10-CM | POA: Diagnosis not present

## 2024-06-30 LAB — AEROBIC/ANAEROBIC CULTURE W GRAM STAIN (SURGICAL/DEEP WOUND)

## 2024-07-01 DIAGNOSIS — J853 Abscess of mediastinum: Secondary | ICD-10-CM | POA: Diagnosis not present

## 2024-07-02 DIAGNOSIS — J853 Abscess of mediastinum: Secondary | ICD-10-CM | POA: Diagnosis not present

## 2024-07-03 DIAGNOSIS — J853 Abscess of mediastinum: Secondary | ICD-10-CM | POA: Diagnosis not present

## 2024-07-04 ENCOUNTER — Encounter: Payer: Self-pay | Admitting: Orthopedic Surgery

## 2024-07-04 ENCOUNTER — Ambulatory Visit (INDEPENDENT_AMBULATORY_CARE_PROVIDER_SITE_OTHER): Payer: Self-pay | Admitting: Orthopedic Surgery

## 2024-07-04 VITALS — HR 88 | Temp 96.0°F | Resp 16 | Ht 69.0 in | Wt 142.4 lb

## 2024-07-04 DIAGNOSIS — Z8342 Family history of familial hypercholesterolemia: Secondary | ICD-10-CM

## 2024-07-04 DIAGNOSIS — J853 Abscess of mediastinum: Secondary | ICD-10-CM

## 2024-07-04 DIAGNOSIS — Z95828 Presence of other vascular implants and grafts: Secondary | ICD-10-CM

## 2024-07-04 DIAGNOSIS — D649 Anemia, unspecified: Secondary | ICD-10-CM

## 2024-07-04 DIAGNOSIS — Z853 Personal history of malignant neoplasm of breast: Secondary | ICD-10-CM

## 2024-07-04 NOTE — Progress Notes (Signed)
 Careteam: Patient Care Team: Rudy Carlin LABOR, MD as PCP - General (Obstetrics and Gynecology) Loni Soyla LABOR, MD as PCP - Cardiology (Cardiology)  Seen by: Greig Cluster, AGNP-C  PLACE OF SERVICE:  [facility]  Advanced Directive information    No Known Allergies  Chief Complaint  Patient presents with   Establish Care    New patient   Concern     PICC line dressing changes     HPI: Patient is a 58 y.o. female seen today to establish at [facility].   Discussed the use of AI scribe software for clinical note transcription with the patient, who gave verbal consent to proceed.  History of Present Illness    She was hospitalized at [facility] 08/07-08/15 chest abscess and concerns of sepsis. Prior to hospitalization, she experienced chest pressure, described as 'someone sitting on my chest.' A CT scan confirmed the presence of an abscess, and pleural fluid was withdrawn and tested. She was treated with IV antibiotics. She did require chest tube for brief time, removed 08/14. She currently has a PICC line and self administering IV ertapenem  daily. She reports finish date 09/01. Denies fever or chest pain at this time. Antibiotics have given her bad taste in mouth causing decrease in appetite and weight loss. She has a home health RN coming to perform PICC line care. She is scheduled for follow-up with infectious disease on September 2nd and thoracic surgery on August 29th for a chest x-ray.   Her past medical history includes breast cancer diagnosed in 2014, treated with a mastectomy, radiation for 32 weeks, and five years of tamoxifen . She has not experienced recurrence and continues with annual mammograms. Dr. Rudy, her OBGYN, has followed her for years and has ordered screening tests. She had a bone density test a few years ago, which indicated the onset of osteoporosis. She is not on supplement at this time.   Reports being due for colonoscopy.   She fractured her  ankle in the past, requiring surgical intervention with rods and arch reconstruction in 2009. Denies right ankle pain today.   No history of heart attack, diabetes, high blood pressure, or current high cholesterol. No cough, shortness of breath, heartburn, constipation, urinary issues, joint pain, dizziness, or headaches. She feels sad due to her recent hospitalization but otherwise denies depression or anxiety.  She is a Health and safety inspector by profession, originally from [place], [place] , and moved to [place]  for college at [employer]. She has been married for 20 years and has an 62 year old child. 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     Review of Systems:  Review of Systems  Constitutional:  Positive for weight loss.  HENT: Negative.    Respiratory: Negative.    Cardiovascular: Negative.   Genitourinary: Negative.   Musculoskeletal: Negative.   Neurological: Negative.   Psychiatric/Behavioral: Negative.      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   Personal history of radiation therapy    Retropharyngeal abscess 05/22/2024   Right foot pain    S/P  radiation therapy 12/06/12 -01/23/13   Left Breast/Axilla / 46 Gy / 23 Fractions with a Boost to Left Breast / 14 Gy / 7 Fractions   Use of tamoxifen  (Nolvadex ) 01/2013   Wears glasses    Past Surgical History:  Procedure Laterality Date   ACHILLES TENDON SURGERY Right 06/02/2020   Procedure: ACHILLES LENGTHENING/KIDNER;  Surgeon: Elsa Lonni SAUNDERS, MD;  Location: [facility];  Service:  Orthopedics;  Laterality: Right;   BREAST LUMPECTOMY Left    takes tamoxifen    CALCANEAL OSTEOTOMY Right 06/02/2020   Procedure: RIGHT LATERAL DISPLACEMENT CALCANEAL OSTEOTOMY, FLEXOR DIGITORUM LONGUS TRANSFER, SPRING LIGAMENT RECONSTRUCTION, POSTERIOR TIBIAL TENDON DEBRIDEMENT, DEEP ORTHOPEDIC HARDWARE REMOVAL, MEDIAL CUNEIFORM PLANTAR FLEXION OSTEOTOMY AND ACHILLES LENGTHENING, PARTIAL RESECTION OF NAVICULAR;  Surgeon: Elsa Lonni SAUNDERS, MD;  Location: [facility];  Service: Orthopedics;  Latera   DILATION AND CURETTAGE OF UTERUS     Following Miscarriage   FOOT FUSION  3/09   ankle rt   HARDWARE REMOVAL Right 06/02/2020   Procedure: HARDWARE REMOVAL;  Surgeon: Elsa Lonni SAUNDERS, MD;  Location: [facility];  Service: Orthopedics;  Laterality: Right;   Left Breast Lumpectomy  08/28/12   Left Breast Needle Core Biopsy  07/26/12   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 Microcalcifications Identified   METATARSAL OSTEOTOMY Right 06/02/2020   Procedure: METATARSAL OSTEOTOMY;  Surgeon: Elsa Lonni SAUNDERS, MD;  Location: [facility];  Service: Orthopedics;  Laterality: Right;   RE-EXCISION OF BREAST CANCER,SUPERIOR MARGINS  10/30/2012   Procedure: RE-EXCISION OF BREAST CANCER,SUPERIOR MARGINS;  Surgeon: Elon CHRISTELLA Pacini, MD;  Location: [facility];  Service: General;  Laterality: N/A;  left partial mastectomy with excision of margins   re-excision of left breast cancer on 09/10/12     Social History:   reports that she quit smoking about 30 years ago. Her smoking use included cigarettes. She has never used smokeless tobacco. She reports that she does not currently use alcohol. She reports that she does not use drugs.  Family History  Problem Relation Age of Onset   Breast cancer Mother 49       blood cancer too   Brain cancer Paternal Aunt        diagnosed in late 75s to early 48s   Breast cancer Cousin        paternal cousin diagnosed;  diagnosed in her late 68s   Colon cancer Neg Hx    Colon polyps Neg Hx    Esophageal cancer Neg Hx    Rectal cancer Neg Hx    Stomach cancer Neg Hx     Medications: Patient's Medications  New Prescriptions   No medications on file  Previous Medications   ACETAMINOPHEN  (TYLENOL ) 500 MG TABLET    Take 1,500 mg by mouth as needed for mild pain (pain score 1-3) or moderate pain (pain score 4-6).   ERTAPENEM  (INVANZ ) IVPB    Inject 1 g into the vein daily for 17 days. Indication:  Mediastinal/pleural abscess First Dose: Yes Last Day of Therapy:  07/15/24 Labs - Once weekly:  CBC/D and BMP, Labs - Once weekly: ESR and CRP Method of administration: Mini-Bag Plus / Gravity Method of administration may be changed at the discretion of home infusion pharmacist based upon assessment of the patient and/or caregiver's ability to self-administer the medication ordered.  Modified Medications   No medications on file  Discontinued Medications   No medications on file    Physical Exam:  Vitals:   07/04/24 1005  Pulse: 100  Resp: 16  Temp: (!) 96 F (35.6 C)  SpO2: 98%  Weight: 142 lb 6.4 oz (64.6 kg)  Height: 5' 9 (1.753 m)   Body mass index is 21.03 kg/m. Wt Readings from Last 3 Encounters:  07/04/24 142 lb 6.4 oz (64.6 kg)  06/20/24 154 lb (69.9 kg)  06/06/24 154 lb (69.9 kg)    Physical Exam Vitals reviewed.  Constitutional:      General: She is not in acute distress. HENT:     Head: Normocephalic.  Eyes:     General:        Right eye: No discharge.        Left eye: No discharge.  Cardiovascular:     Rate and Rhythm: Normal rate and regular rhythm.     Pulses: Normal pulses.     Heart sounds: Normal heart sounds.     Comments: Left chest tube site, closed, CDI Pulmonary:     Effort: Pulmonary effort is normal.     Breath sounds: Normal breath sounds.  Abdominal:     General: Bowel sounds are normal.     Palpations: Abdomen is soft.  Musculoskeletal:     Cervical  back: Neck supple.     Right lower leg: No edema.     Left lower leg: No edema.  Skin:    General: Skin is warm.  Neurological:     General: No focal deficit present.     Mental Status: She is alert and oriented to person, place, and time.  Psychiatric:        Mood and Affect: Mood normal.     Labs reviewed: Basic Metabolic Panel: Recent Labs    06/24/24 0459 06/25/24 0344 06/26/24 0153 06/27/24 0233 06/28/24 0400  NA 131* 134* 136 135 134*  K 3.3* 4.2 3.6 4.0 3.5  CL 100 105 102 103 102  CO2 22 22 21* 22 23  GLUCOSE 98 88 75 73 86  BUN <5* <5* 5* 5* 5*  CREATININE 0.49 0.43* 0.52 0.51 0.39*  CALCIUM 8.3* 8.1* 8.7* 8.6* 8.5*  MG 1.9 2.0 1.9  --   --   PHOS 3.6 2.9 3.4 3.9 4.5   Liver Function Tests: Recent Labs    05/22/24 1133 06/20/24 1641  AST 21 22  ALT 12 12  ALKPHOS 76 119  BILITOT 1.0 0.5  PROT 7.3 7.8  ALBUMIN 3.1* 2.3*   No results for input(s): LIPASE, AMYLASE in the last 8760 hours. No results for input(s): AMMONIA in the last 8760 hours. CBC: Recent Labs    06/26/24 0153 06/27/24 0233 06/28/24 0400  WBC 7.0 6.6 5.7  NEUTROABS 4.8 4.2 4.3  HGB 8.7* 8.9* 8.4*  HCT 26.9* 27.1* 25.9*  MCV 96.8 98.2 98.1  PLT 464* 441* 421*   Lipid Panel: No results for input(s): CHOL, HDL, LDLCALC, TRIG, CHOLHDL, LDLDIRECT in the last 8760 hours. TSH: No results for input(s): TSH in the last 8760 hours. A1C: Lab Results  Component Value Date   HGBA1C 5.4 05/23/2024     Assessment/Plan 1. Mediastinal abscess with possible sepsis (Primary) - hospitalized 08/07-08/15 - treated with IV Zosyn  and switched to IV ertapenem  (finish date 09/01) - pleurvac chest tube drain discontinued 08/14> wound closed on exam today - followed by I/D> scheduled 09/02 - cont HHRN for PICC line care   2. S/P PICC central line placement - see above  3. History of breast cancer - diagnosed 2013 -  lumpectomy 08/28/2012> biopsy ductal carcinoma in  situ - re excision for margin clearance 10/30/2012 - completed radiation 01/23/2013 - completed tamoxifen  x 5 years - cont yearly screening mammogram   4. Low hemoglobin - hgb 8.4 at discharge 08/15 - consider iron supplement - recheck hgb next routine visit due to PICC line/ left mastectomy  Total time: 48 minutes. Greater than 50% of total time spent doing patient education regarding recent hospitalization, PICC line care, Pleur vac wound care and past breast cancer including symptom/medication management.      Next appt: Visit date not found  Reia Viernes Gil BODILY  [facility] [phone]

## 2024-07-04 NOTE — Patient Instructions (Addendum)
 Schedule physical exam in 4-8 weeks.   Plan to do blood work next visit> stop eating 12 hours before appointment> water and black coffee ok only   Please message me if you need anything

## 2024-07-05 DIAGNOSIS — J853 Abscess of mediastinum: Secondary | ICD-10-CM | POA: Diagnosis not present

## 2024-07-06 DIAGNOSIS — J853 Abscess of mediastinum: Secondary | ICD-10-CM | POA: Diagnosis not present

## 2024-07-07 DIAGNOSIS — J853 Abscess of mediastinum: Secondary | ICD-10-CM | POA: Diagnosis not present

## 2024-07-08 DIAGNOSIS — J853 Abscess of mediastinum: Secondary | ICD-10-CM | POA: Diagnosis not present

## 2024-07-09 DIAGNOSIS — J853 Abscess of mediastinum: Secondary | ICD-10-CM | POA: Diagnosis not present

## 2024-07-10 DIAGNOSIS — J853 Abscess of mediastinum: Secondary | ICD-10-CM | POA: Diagnosis not present

## 2024-07-11 ENCOUNTER — Other Ambulatory Visit: Payer: Self-pay | Admitting: Thoracic Surgery (Cardiothoracic Vascular Surgery)

## 2024-07-11 DIAGNOSIS — J853 Abscess of mediastinum: Secondary | ICD-10-CM | POA: Diagnosis not present

## 2024-07-12 ENCOUNTER — Ambulatory Visit
Admission: RE | Admit: 2024-07-12 | Discharge: 2024-07-12 | Disposition: A | Discharge status: Home / Self Care | Source: Ambulatory Visit | Attending: Cardiology | Admitting: Cardiology

## 2024-07-12 ENCOUNTER — Ambulatory Visit (INDEPENDENT_AMBULATORY_CARE_PROVIDER_SITE_OTHER)

## 2024-07-12 VITALS — BP 156/89 | HR 99 | Resp 20 | Ht 69.0 in | Wt 141.6 lb

## 2024-07-12 DIAGNOSIS — J853 Abscess of mediastinum: Secondary | ICD-10-CM | POA: Insufficient documentation

## 2024-07-12 DIAGNOSIS — J90 Pleural effusion, not elsewhere classified: Secondary | ICD-10-CM | POA: Diagnosis not present

## 2024-07-12 DIAGNOSIS — Z452 Encounter for adjustment and management of vascular access device: Secondary | ICD-10-CM | POA: Diagnosis not present

## 2024-07-12 DIAGNOSIS — R918 Other nonspecific abnormal finding of lung field: Secondary | ICD-10-CM | POA: Diagnosis not present

## 2024-07-12 NOTE — Progress Notes (Signed)
 [address] [name] [id number]             [phone]       HPI: [name] is a 58 year old female with medical history of emphysema, breast cancer s/p radiation,and pericarditis who returns for routine postoperative follow-up having undergone pleurevac drain placement with interventional radiology for mediastinal abscess with pleural fluid collection on 06/24/2024.   The patient's early postoperative recovery while in the hospital was notable for receiving IV ertapenem  1 gram daily.  She was followed by infectious disease and is to complete 2 weeks of IV ertapenem  through PICC line. She was able to have drain removed on 06/27/2024.  She was stable for discharge on 06/28/2024.   Since hospital discharge the patient reports feeling much better.  She is almost done with her home infusions of antibiotics.  She reports that her incision site is healed from chest tube placement.  She denies chest pain, fever, chills, shortness of breath and lower leg swelling.   Current Outpatient Medications  Medication Sig Dispense Refill   acetaminophen  (TYLENOL ) 500 MG tablet Take 1,500 mg by mouth as needed for mild pain (pain score 1-3) or moderate pain (pain score 4-6).     ertapenem  (INVANZ ) IVPB Inject 1 g into the vein daily for 17 days. Indication:  Mediastinal/pleural abscess First Dose: Yes Last Day of Therapy:  07/15/24 Labs - Once weekly:  CBC/D and BMP, Labs - Once weekly: ESR and CRP Method of administration: Mini-Bag Plus / Gravity Method of administration may be changed at the discretion of home infusion pharmacist based upon assessment of the patient and/or caregiver's ability to self-administer the medication ordered. 17 Units 0   No current facility-administered medications for this visit.   Vitals:   07/12/24 1120  BP: (!) 156/89  Pulse: 99  Resp: 20  SpO2: 96%    Review of Systems  Constitutional: Negative.  Negative for chills, fever and  malaise/fatigue.  Respiratory: Negative.  Negative for cough and shortness of breath.   Cardiovascular: Negative.  Negative for chest pain, palpitations and leg swelling.    Physical Exam Constitutional:      Appearance: Normal appearance.  HENT:     Head: Normocephalic and atraumatic.  Cardiovascular:     Rate and Rhythm: Normal rate and regular rhythm.     Heart sounds: Normal heart sounds, S1 normal and S2 normal.  Pulmonary:     Effort: Pulmonary effort is normal.     Breath sounds: Normal breath sounds.  Musculoskeletal:     Cervical back: Normal range of motion.  Skin:    General: Skin is warm and dry.      Neurological:     General: No focal deficit present.     Mental Status: She is alert.      Diagnostic Tests: CLINICAL DATA:  Chest tube placement.  Mediastinal abscess.   EXAM: CHEST - 2 VIEW   COMPARISON:  06/27/2024.  Chest CT dated 06/17/2024.   FINDINGS: Normal-sized heart. Small right pleural effusion and adjacent patchy density again demonstrated at the right lung base. Minimal left pleural effusion. Oval posterior pleural fluid loculation is again demonstrated on the lateral view. No chest tube or pneumothorax seen. Right PICC tip in the superior vena cava proximally 4.5 cm proximal to the superior cavoatrial junction. Mild scoliosis.   IMPRESSION: 1. Small right pleural effusion and adjacent patchy atelectasis or pneumonia at  the right lung base. 2. Minimal left pleural effusion. 3. Oval posterior pleural fluid loculation is again demonstrated on the lateral view.     Electronically Signed   By: Elspeth Bathe M.D.   On: 07/12/2024 11:19   Plan: Mediastinal abscess with possible sepsis -We discussed today's chest x-ray.  She still has small right pleural effusion and a minimal left pleural effusion.  She denies shortness of breath and dry cough.  Will have her follow-up in 1 month with chest x-ray to make sure effusion has resolved.  Chest tube  incision site healed.  She is to keep the area clean and dry  Infectious disease.  Her IV abx treatment is to end on 07/15/2024.  She has a follow up apt on 07/16/2024.   Manuelita CHRISTELLA Rough, PA-C [facility] [phone]

## 2024-07-12 NOTE — Patient Instructions (Signed)
-   Follow-up in 1 month with chest x-ray -Continue follow-up with infectious disease as scheduled

## 2024-07-13 DIAGNOSIS — J853 Abscess of mediastinum: Secondary | ICD-10-CM | POA: Diagnosis not present

## 2024-07-13 LAB — LAB REPORT - SCANNED: EGFR: 110

## 2024-07-14 DIAGNOSIS — J853 Abscess of mediastinum: Secondary | ICD-10-CM | POA: Diagnosis not present

## 2024-07-14 NOTE — Progress Notes (Unsigned)
 Chief complaint: follow-up mediastinal abscess pleural abscess  Subjective:    Patient ID: Sue [name], female    DOB: [birth year], 58 y.o.   MRN: [id number]  HPI  Discussed the use of AI scribe software for clinical note transcription with the patient, who gave verbal consent to proceed.  History of Present Illness   Sue [name] is a 58 year old female who presents for follow-up of a mediastinal abscess, empyema.  Previously hospitalized in July for a supraglottic and mediastinal abscess, she received antibiotics including Zosyn , Decadron , and vancomycin , later changed to [facility] and oral Augmentin . She had difficulty tolerating Augmentin  and experienced weight loss.  A repeat CT scan showed resolution of the supraglottic and retropharyngeal abscess, but the mediastinal abscess had become multiseptate and larger. Additionally, a new wedge-shaped area of consolidation was noted in the medial segment of the middle lobe, along with loculated fluid at the base of the pleural cavity. The pleural fluid was evacuated and cultured, but the cultures were unrevealing.  She was on ertapenem  and has a PICC line in place but last dose was yesterday.. She is eating and does not feel 'icky'. She wants to return to work to feel more like herself again. Her pleural infection is resolved.       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   Personal history of radiation therapy    Retropharyngeal abscess 05/22/2024   Right foot pain    S/P radiation therapy 12/06/12 -01/23/13   Left Breast/Axilla / 46 Gy / 23 Fractions  with a Boost to Left Breast / 14 Gy / 7 Fractions   Use of tamoxifen  (Nolvadex ) 01/2013   Wears glasses     Past Surgical History:  Procedure Laterality Date   ACHILLES TENDON SURGERY Right 06/02/2020   Procedure: ACHILLES LENGTHENING/KIDNER;  Surgeon: Elsa Lonni SAUNDERS, MD;  Location: [facility];  Service: Orthopedics;  Laterality: Right;   BREAST LUMPECTOMY Left    takes tamoxifen    CALCANEAL OSTEOTOMY Right 06/02/2020   Procedure: RIGHT LATERAL DISPLACEMENT CALCANEAL OSTEOTOMY, FLEXOR DIGITORUM LONGUS TRANSFER, SPRING LIGAMENT RECONSTRUCTION, POSTERIOR TIBIAL TENDON DEBRIDEMENT, DEEP ORTHOPEDIC HARDWARE REMOVAL, MEDIAL CUNEIFORM PLANTAR FLEXION OSTEOTOMY AND ACHILLES LENGTHENING, PARTIAL RESECTION OF NAVICULAR;  Surgeon: Elsa Lonni SAUNDERS, MD;  Location: [facility];  Service: Orthopedics;  Latera   DILATION AND CURETTAGE OF UTERUS     Following Miscarriage   FOOT FUSION  3/09   ankle rt   HARDWARE REMOVAL Right 06/02/2020   Procedure: HARDWARE REMOVAL;  Surgeon: Elsa Lonni SAUNDERS, MD;  Location: [facility];  Service: Orthopedics;  Laterality: Right;   Left Breast Lumpectomy  08/28/12   Left Breast Needle Core Biopsy  07/26/12   UOQ - Ductal Carcinoma In Situ with Necrosis. Microcalcifications Identified   METATARSAL OSTEOTOMY Right 06/02/2020   Procedure: METATARSAL OSTEOTOMY;  Surgeon: Elsa Lonni SAUNDERS, MD;  Location: [facility];  Service: Orthopedics;  Laterality: Right;   RE-EXCISION OF BREAST CANCER,SUPERIOR MARGINS  10/30/2012   Procedure: RE-EXCISION OF BREAST CANCER,SUPERIOR MARGINS;  Surgeon: Elon CHRISTELLA Pacini, MD;  Location: [facility];  Service: General;  Laterality: N/A;  left partial mastectomy with excision of margins   re-excision of left breast cancer on 09/10/12      Family History  Problem Relation Age of Onset   Breast cancer Mother 62       blood cancer too   Brain cancer Paternal Aunt         diagnosed in late 33s to early 74s   Breast cancer Cousin        paternal cousin diagnosed; diagnosed in her late 48s   Colon cancer Neg Hx    Colon polyps Neg Hx    Esophageal cancer Neg Hx    Rectal cancer Neg Hx    Stomach cancer Neg Hx       Social History   Socioeconomic History   Marital status: Married    Spouse name: Not on file   Number of children: 1   Years of education: Not on file   Highest education level: Bachelor's degree (e.g., BA, AB, BS)  Occupational History   Not on file  Tobacco Use   Smoking status: Former    Current packs/day: 0.00    Types: Cigarettes    Quit date: 11/30/1993    Years since quitting: 30.6   Smokeless tobacco: Never  Vaping Use   Vaping status: Never Used  Substance and Sexual Activity   Alcohol use: Not Currently    Comment: 1x per week   Drug use: No   Sexual activity: Yes    Partners: Male    Birth control/protection: Post-menopausal  Other Topics Concern   Not on file  Social History Narrative   Not on file   Social Drivers of Health   Financial Resource Strain: Not on file  Food Insecurity: No Food Insecurity (06/25/2024)   Hunger Vital Sign    Worried About Running Out of Food in the Last Year: Never true    Ran Out of Food in the Last Year: Never true  Transportation Needs: No Transportation Needs (06/25/2024)   PRAPARE - Administrator, Civil Service (Medical): No    Lack of Transportation (Non-Medical): No  Physical Activity: Not on file  Stress: Not on file  Social Connections: Unknown (03/29/2022)   [facility]   Social Network    Social Network: Not on file    No Known Allergies   Current Outpatient Medications:    acetaminophen  (TYLENOL ) 500 MG tablet, Take 1,500 mg by mouth as needed for mild pain (pain score 1-3) or moderate pain (pain score 4-6)., Disp: , Rfl:    ertapenem  (INVANZ ) IVPB, Inject 1 g into the vein daily for 17 days. Indication:  Mediastinal/pleural abscess  First Dose: Yes Last Day of Therapy:  07/15/24 Labs - Once weekly:  CBC/D and BMP, Labs - Once weekly: ESR and CRP Method of administration: Mini-Bag Plus / Gravity Method of administration may be changed at the discretion of home infusion pharmacist based upon assessment of the patient and/or caregiver's ability to self-administer the medication ordered., Disp: 17 Units, Rfl: 0   Review of Systems  Constitutional:  Negative for activity change, appetite change, chills, diaphoresis, fatigue, fever and unexpected weight change.  HENT:  Negative for congestion, rhinorrhea, sinus pressure, sneezing, sore throat and trouble swallowing.   Eyes:  Negative for photophobia and visual disturbance.  Respiratory:  Negative for cough, chest tightness, shortness of breath, wheezing and stridor.   Cardiovascular:  Negative for chest pain, palpitations and leg swelling.  Gastrointestinal:  Negative for abdominal distention, abdominal pain, anal bleeding, blood in stool, constipation, diarrhea, nausea and vomiting.  Genitourinary:  Negative for difficulty urinating, dysuria, flank pain and hematuria.  Musculoskeletal:  Negative for arthralgias, back pain, gait problem, joint swelling and myalgias.  Skin:  Negative for color change, pallor, rash and wound.  Neurological:  Negative for dizziness, tremors, weakness and light-headedness.  Hematological:  Negative for adenopathy. Does not bruise/bleed easily.  Psychiatric/Behavioral:  Negative for agitation, behavioral problems, confusion, decreased concentration, dysphoric mood and sleep disturbance.        Objective:   Physical Exam Constitutional:      General: She is not in acute distress.    Appearance: Normal appearance. She is well-developed. She is not ill-appearing or diaphoretic.  HENT:     Head: Normocephalic and atraumatic.     Right Ear: Hearing and external ear normal.     Left Ear: Hearing and external ear normal.     Nose: No nasal deformity or  rhinorrhea.  Eyes:     General: No scleral icterus.    Conjunctiva/sclera: Conjunctivae normal.     Right eye: Right conjunctiva is not injected.     Left eye: Left conjunctiva is not injected.     Pupils: Pupils are equal, round, and reactive to light.  Neck:     Vascular: No JVD.  Cardiovascular:     Rate and Rhythm: Normal rate and regular rhythm.     Heart sounds: Normal heart sounds, S1 normal and S2 normal. No murmur heard.    No friction rub. No gallop.  Abdominal:     General: Bowel sounds are normal. There is no distension.     Palpations: Abdomen is soft. There is no mass.     Tenderness: There is no abdominal tenderness.     Hernia: No hernia is present.  Musculoskeletal:        General: Normal range of motion.     Right shoulder: Normal.     Left shoulder: Normal.     Cervical back: Normal range of motion and neck supple.     Right hip: Normal.     Left hip: Normal.     Right knee: Normal.     Left knee: Normal.  Lymphadenopathy:     Head:     Right side of head: No submandibular, preauricular or posterior auricular adenopathy.     Left side of head: No submandibular, preauricular or posterior auricular adenopathy.     Cervical: No cervical adenopathy.     Right cervical: No superficial or deep cervical adenopathy.    Left cervical: No superficial or deep cervical adenopathy.  Skin:    General: Skin is warm and dry.     Coloration: Skin is not pale.     Findings: No abrasion, bruising, ecchymosis, erythema, lesion or rash.     Nails: There is no clubbing.  Neurological:     Mental Status: She is alert and oriented to person, place, and time.     Sensory: No sensory deficit.     Coordination: Coordination normal.     Gait: Gait normal.  Psychiatric:        Attention and Perception: She is attentive.        Speech: Speech normal.        Behavior: Behavior normal. Behavior is cooperative.        Thought Content: Thought content normal.        Judgment:  Judgment normal.  Assessment & Plan:   Assessment and Plan    Mediastinal abscess Abscess enlarged and multiseptate when last imaged.. No surgical intervention due to esophageal risk. Ertapenem  initiated, tolerated well. Would like reassurance radiographically resolution (which we have yet to see)  before stopping abx - Extend ertapenem  for one month. - Order repeat CT scan of the chest to assess the status of the mediastinal abscess. - Continue antibiotics if CT scan shows persistent abscess. - Discontinue antibiotics if CT scan shows resolution of the abscess.  Pleural effusion, resolved Pleural effusion resolved post-drainage and chest tube removal.  Work She expressed desire to return to work and is open to continuing ertapenem  treatment. - Coordinate with home health for continued administration of ertapenem . - Facilitate paperwork for return to work as requested.     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    Labs weekly while on IV antibiotics: _x_ CBC with differential _x_ BMP __ CMP __ CRP __ ESR __ Vancomycin  trough __ CK  __ Please pull PIC at completion of IV antibiotics _x_ Please leave PIC in place until doctor has seen patient or been notified  Fax weekly labs to [phone]

## 2024-07-15 DIAGNOSIS — J853 Abscess of mediastinum: Secondary | ICD-10-CM | POA: Diagnosis not present

## 2024-07-16 ENCOUNTER — Ambulatory Visit (INDEPENDENT_AMBULATORY_CARE_PROVIDER_SITE_OTHER): Payer: Self-pay | Admitting: Infectious Disease

## 2024-07-16 ENCOUNTER — Other Ambulatory Visit: Payer: Self-pay | Admitting: Infectious Disease

## 2024-07-16 ENCOUNTER — Other Ambulatory Visit: Payer: Self-pay

## 2024-07-16 ENCOUNTER — Encounter: Payer: Self-pay | Admitting: Infectious Disease

## 2024-07-16 ENCOUNTER — Telehealth: Payer: Self-pay

## 2024-07-16 VITALS — BP 129/85 | HR 86 | Ht 69.0 in | Wt 152.0 lb

## 2024-07-16 DIAGNOSIS — C50412 Malignant neoplasm of upper-outer quadrant of left female breast: Secondary | ICD-10-CM | POA: Diagnosis not present

## 2024-07-16 DIAGNOSIS — J90 Pleural effusion, not elsewhere classified: Secondary | ICD-10-CM | POA: Diagnosis not present

## 2024-07-16 DIAGNOSIS — F32A Depression, unspecified: Secondary | ICD-10-CM

## 2024-07-16 DIAGNOSIS — J387 Other diseases of larynx: Secondary | ICD-10-CM | POA: Diagnosis not present

## 2024-07-16 DIAGNOSIS — J853 Abscess of mediastinum: Secondary | ICD-10-CM | POA: Diagnosis not present

## 2024-07-16 DIAGNOSIS — Z170 Estrogen receptor positive status [ER+]: Secondary | ICD-10-CM

## 2024-07-16 MED ORDER — ZOSTER VAC RECOMB ADJUVANTED 50 MCG/0.5ML IM SUSR
1.0000 mL | Freq: Once | INTRAMUSCULAR | 0 refills | Status: AC
  Filled 2024-07-16 – 2024-07-17 (×2): qty 1, 1d supply, fill #0

## 2024-07-16 NOTE — Telephone Encounter (Signed)
 Per Dr Fleeta Rothman extend patient opat to 10/2 for Iv abx. Sent a message to [employer] about extension and new opat was placed.

## 2024-07-17 ENCOUNTER — Other Ambulatory Visit: Payer: Self-pay

## 2024-07-17 DIAGNOSIS — J853 Abscess of mediastinum: Secondary | ICD-10-CM | POA: Diagnosis not present

## 2024-07-18 DIAGNOSIS — J853 Abscess of mediastinum: Secondary | ICD-10-CM | POA: Diagnosis not present

## 2024-07-19 DIAGNOSIS — J853 Abscess of mediastinum: Secondary | ICD-10-CM | POA: Diagnosis not present

## 2024-07-20 DIAGNOSIS — J853 Abscess of mediastinum: Secondary | ICD-10-CM | POA: Diagnosis not present

## 2024-07-21 DIAGNOSIS — J853 Abscess of mediastinum: Secondary | ICD-10-CM | POA: Diagnosis not present

## 2024-07-22 ENCOUNTER — Telehealth: Payer: Self-pay

## 2024-07-22 DIAGNOSIS — J853 Abscess of mediastinum: Secondary | ICD-10-CM | POA: Diagnosis not present

## 2024-07-22 NOTE — Telephone Encounter (Signed)
 Patient requesting letter to return to work on 07/31/24 with no restrictions.  We will need to fax the letter to Kyra Backers @ [phone] and let the patient know once completed.  Please advise

## 2024-07-23 ENCOUNTER — Encounter: Payer: Self-pay | Admitting: Infectious Disease

## 2024-07-23 DIAGNOSIS — J853 Abscess of mediastinum: Secondary | ICD-10-CM | POA: Diagnosis not present

## 2024-07-23 NOTE — Telephone Encounter (Signed)
Letter faxed. Patient aware.

## 2024-07-23 NOTE — Telephone Encounter (Signed)
 Fax [phone]

## 2024-07-24 DIAGNOSIS — J853 Abscess of mediastinum: Secondary | ICD-10-CM | POA: Diagnosis not present

## 2024-07-25 DIAGNOSIS — J853 Abscess of mediastinum: Secondary | ICD-10-CM | POA: Diagnosis not present

## 2024-07-26 DIAGNOSIS — J853 Abscess of mediastinum: Secondary | ICD-10-CM | POA: Diagnosis not present

## 2024-07-27 DIAGNOSIS — J853 Abscess of mediastinum: Secondary | ICD-10-CM | POA: Diagnosis not present

## 2024-07-28 DIAGNOSIS — J853 Abscess of mediastinum: Secondary | ICD-10-CM | POA: Diagnosis not present

## 2024-07-29 ENCOUNTER — Other Ambulatory Visit

## 2024-07-29 ENCOUNTER — Ambulatory Visit
Admission: RE | Admit: 2024-07-29 | Discharge: 2024-07-29 | Disposition: A | Discharge status: Home / Self Care | Source: Ambulatory Visit | Attending: Infectious Disease | Admitting: Infectious Disease

## 2024-07-29 ENCOUNTER — Ambulatory Visit (HOSPITAL_COMMUNITY)

## 2024-07-29 DIAGNOSIS — J853 Abscess of mediastinum: Secondary | ICD-10-CM

## 2024-07-29 DIAGNOSIS — J439 Emphysema, unspecified: Secondary | ICD-10-CM | POA: Diagnosis not present

## 2024-07-29 MED ORDER — IOPAMIDOL (ISOVUE-300) INJECTION 61%
80.0000 mL | Freq: Once | INTRAVENOUS | Status: AC | PRN
  Administered 2024-07-29: 80 mL via INTRAVENOUS

## 2024-07-30 DIAGNOSIS — J853 Abscess of mediastinum: Secondary | ICD-10-CM | POA: Diagnosis not present

## 2024-07-31 DIAGNOSIS — J853 Abscess of mediastinum: Secondary | ICD-10-CM | POA: Diagnosis not present

## 2024-08-01 DIAGNOSIS — J853 Abscess of mediastinum: Secondary | ICD-10-CM | POA: Diagnosis not present

## 2024-08-02 DIAGNOSIS — J853 Abscess of mediastinum: Secondary | ICD-10-CM | POA: Diagnosis not present

## 2024-08-03 DIAGNOSIS — J853 Abscess of mediastinum: Secondary | ICD-10-CM | POA: Diagnosis not present

## 2024-08-04 DIAGNOSIS — J853 Abscess of mediastinum: Secondary | ICD-10-CM | POA: Diagnosis not present

## 2024-08-05 ENCOUNTER — Ambulatory Visit: Payer: Self-pay

## 2024-08-05 DIAGNOSIS — J853 Abscess of mediastinum: Secondary | ICD-10-CM | POA: Diagnosis not present

## 2024-08-05 NOTE — Telephone Encounter (Signed)
 Called patient, her husband Elspeth answered and stated he would have Shantana give our office a call tomorrow morning.   Toi Stelly, BSN, RN

## 2024-08-06 DIAGNOSIS — J853 Abscess of mediastinum: Secondary | ICD-10-CM | POA: Diagnosis not present

## 2024-08-07 DIAGNOSIS — J853 Abscess of mediastinum: Secondary | ICD-10-CM | POA: Diagnosis not present

## 2024-08-07 NOTE — Telephone Encounter (Signed)
 Florence Norris, no answer. Left HIPAA compliant voicemail requesting callback.   Quamere Mussell, BSN, RN

## 2024-08-08 DIAGNOSIS — J853 Abscess of mediastinum: Secondary | ICD-10-CM | POA: Diagnosis not present

## 2024-08-09 DIAGNOSIS — J853 Abscess of mediastinum: Secondary | ICD-10-CM | POA: Diagnosis not present

## 2024-08-10 DIAGNOSIS — J853 Abscess of mediastinum: Secondary | ICD-10-CM | POA: Diagnosis not present

## 2024-08-11 DIAGNOSIS — J853 Abscess of mediastinum: Secondary | ICD-10-CM | POA: Diagnosis not present

## 2024-08-12 ENCOUNTER — Ambulatory Visit: Payer: Self-pay | Admitting: Infectious Disease

## 2024-08-12 DIAGNOSIS — J853 Abscess of mediastinum: Secondary | ICD-10-CM | POA: Diagnosis not present

## 2024-08-13 DIAGNOSIS — J853 Abscess of mediastinum: Secondary | ICD-10-CM | POA: Diagnosis not present

## 2024-08-14 DIAGNOSIS — J853 Abscess of mediastinum: Secondary | ICD-10-CM | POA: Diagnosis not present

## 2024-08-15 DIAGNOSIS — J853 Abscess of mediastinum: Secondary | ICD-10-CM | POA: Diagnosis not present

## 2024-08-15 NOTE — Telephone Encounter (Signed)
 Called patient, no answer. Left HIPAA compliant voicemail requesting callback.   Janaria Mccammon, BSN, RN

## 2024-08-16 ENCOUNTER — Ambulatory Visit

## 2024-08-17 DIAGNOSIS — J853 Abscess of mediastinum: Secondary | ICD-10-CM | POA: Diagnosis not present

## 2024-08-21 NOTE — Telephone Encounter (Signed)
 Tried [employer], no answer. Left HIPAA compliant voicemail requesting callback.   Multiple attempts have been made to reach patient. Letter requesting callback mailed to address on file.   Aubery Douthat, BSN, RN

## 2024-08-30 ENCOUNTER — Telehealth: Payer: Self-pay

## 2024-08-30 NOTE — Telephone Encounter (Signed)
 Received a call from the patient regarding their PICC line. Wanting to know when picc line was going to be pulled because they only have about 4-5 mediballs left.  No current orders to pull the PICC line No follow-up appointment scheduled Infusion center reports an extension order through 10/15  Routing to provider for next steps.   Enis Kleine, LPN

## 2024-09-02 ENCOUNTER — Telehealth: Payer: Self-pay

## 2024-09-02 NOTE — Addendum Note (Signed)
 Addended by: CECILIE PAIR on: 09/02/2024 10:43 AM   Modules accepted: Orders

## 2024-09-02 NOTE — Telephone Encounter (Signed)
 Patient left a voicemail asking when her PICC line is to be removed. Patient canceled last appointment. Does she need a follow up prior to removal or okay to give orders to remove?  Kareem Cathey SHAUNNA Letters, CMA

## 2024-09-02 NOTE — Telephone Encounter (Signed)
 Triage staff place call to [facility]; spoke with Massie and gave verbal orders per Dr. Fleeta Rothman to d/c picc line at the EOT. Verbal order read back and understood. Attempted to contact patient, no answer. Will send mychart message with recs from Dr. Fleeta Rothman.  Enis Kleine, LPN

## 2024-09-03 NOTE — Telephone Encounter (Signed)
 So all of triage is aware:  I have contacted the [facility] team and spoke with nursing supervisor. She will have a nurse go out to the home today pull picc line. She will also give our office a call so triage team can speak with patient to give update on plans for oral therapy and also needs a 2 week f/u appointment with Dr. Fleeta Rothman.

## 2024-09-07 DIAGNOSIS — J853 Abscess of mediastinum: Secondary | ICD-10-CM | POA: Diagnosis not present

## 2024-09-11 NOTE — Telephone Encounter (Signed)
 Per Pam with [employer] picc line removed

## 2024-09-20 ENCOUNTER — Other Ambulatory Visit (HOSPITAL_BASED_OUTPATIENT_CLINIC_OR_DEPARTMENT_OTHER): Payer: Self-pay

## 2024-10-04 ENCOUNTER — Other Ambulatory Visit: Payer: Self-pay

## 2024-10-04 MED ORDER — ZOSTER VAC RECOMB ADJUVANTED 50 MCG/0.5ML IM SUSR
0.5000 mL | Freq: Once | INTRAMUSCULAR | 0 refills | Status: AC
  Filled 2024-10-04: qty 1, 1d supply, fill #0

## 2024-12-18 ENCOUNTER — Other Ambulatory Visit: Payer: Self-pay | Admitting: Obstetrics

## 2024-12-18 DIAGNOSIS — Z1231 Encounter for screening mammogram for malignant neoplasm of breast: Secondary | ICD-10-CM

## 2025-01-22 ENCOUNTER — Ambulatory Visit: Payer: Self-pay | Admitting: Obstetrics

## 2025-03-14 ENCOUNTER — Ambulatory Visit
